# Patient Record
Sex: Female | Born: 1935 | Race: White | Hispanic: No | Smoking: Former smoker
Health system: Southern US, Community
[De-identification: ages and names within clinical notes are randomized; demographics above are authoritative.]

## PROBLEM LIST (undated history)

## (undated) DIAGNOSIS — M5090 Cervical disc disorder, unspecified, unspecified cervical region: Secondary | ICD-10-CM

## (undated) DIAGNOSIS — G8929 Other chronic pain: Secondary | ICD-10-CM

## (undated) DIAGNOSIS — M1990 Unspecified osteoarthritis, unspecified site: Secondary | ICD-10-CM

## (undated) DIAGNOSIS — I4811 Longstanding persistent atrial fibrillation: Secondary | ICD-10-CM

## (undated) DIAGNOSIS — E039 Hypothyroidism, unspecified: Secondary | ICD-10-CM

## (undated) DIAGNOSIS — I5032 Chronic diastolic (congestive) heart failure: Secondary | ICD-10-CM

## (undated) DIAGNOSIS — Z8679 Personal history of other diseases of the circulatory system: Secondary | ICD-10-CM

## (undated) DIAGNOSIS — N1330 Unspecified hydronephrosis: Secondary | ICD-10-CM

## (undated) DIAGNOSIS — I2510 Atherosclerotic heart disease of native coronary artery without angina pectoris: Secondary | ICD-10-CM

## (undated) DIAGNOSIS — I214 Non-ST elevation (NSTEMI) myocardial infarction: Secondary | ICD-10-CM

## (undated) DIAGNOSIS — I712 Thoracic aortic aneurysm, without rupture: Secondary | ICD-10-CM

## (undated) DIAGNOSIS — I7101 Dissection of thoracic aorta: Secondary | ICD-10-CM

## (undated) DIAGNOSIS — I10 Essential (primary) hypertension: Secondary | ICD-10-CM

## (undated) DIAGNOSIS — S32591A Other specified fracture of right pubis, initial encounter for closed fracture: Secondary | ICD-10-CM

## (undated) DIAGNOSIS — K219 Gastro-esophageal reflux disease without esophagitis: Secondary | ICD-10-CM

## (undated) DIAGNOSIS — Z953 Presence of xenogenic heart valve: Secondary | ICD-10-CM

## (undated) DIAGNOSIS — Z9889 Other specified postprocedural states: Secondary | ICD-10-CM

## (undated) HISTORY — PX: VAGINAL HYSTERECTOMY: SHX2639

## (undated) HISTORY — DX: Atherosclerotic heart disease of native coronary artery without angina pectoris: I25.10

## (undated) HISTORY — PX: OTHER SURGICAL HISTORY: SHX169

---

## 1998-09-09 ENCOUNTER — Emergency Department (HOSPITAL_COMMUNITY): Admission: EM | Admit: 1998-09-09 | Discharge: 1998-09-09 | Payer: Self-pay | Admitting: Emergency Medicine

## 1998-09-10 ENCOUNTER — Emergency Department (HOSPITAL_COMMUNITY): Admission: EM | Admit: 1998-09-10 | Discharge: 1998-09-10 | Payer: Self-pay | Admitting: Emergency Medicine

## 2000-07-07 ENCOUNTER — Encounter: Admission: RE | Admit: 2000-07-07 | Discharge: 2000-07-07 | Payer: Self-pay | Admitting: Internal Medicine

## 2000-07-07 ENCOUNTER — Encounter: Payer: Self-pay | Admitting: Internal Medicine

## 2001-12-28 ENCOUNTER — Encounter: Admission: RE | Admit: 2001-12-28 | Discharge: 2001-12-28 | Payer: Self-pay | Admitting: Internal Medicine

## 2001-12-28 ENCOUNTER — Encounter: Payer: Self-pay | Admitting: Internal Medicine

## 2002-12-30 ENCOUNTER — Encounter: Payer: Self-pay | Admitting: Internal Medicine

## 2002-12-30 ENCOUNTER — Encounter: Admission: RE | Admit: 2002-12-30 | Discharge: 2002-12-30 | Payer: Self-pay | Admitting: Internal Medicine

## 2004-02-27 ENCOUNTER — Encounter: Admission: RE | Admit: 2004-02-27 | Discharge: 2004-02-27 | Payer: Self-pay | Admitting: Internal Medicine

## 2004-04-01 ENCOUNTER — Ambulatory Visit (HOSPITAL_COMMUNITY): Admission: RE | Admit: 2004-04-01 | Discharge: 2004-04-01 | Payer: Self-pay | Admitting: Gastroenterology

## 2005-05-20 ENCOUNTER — Encounter: Admission: RE | Admit: 2005-05-20 | Discharge: 2005-05-20 | Payer: Self-pay | Admitting: Internal Medicine

## 2006-03-20 ENCOUNTER — Inpatient Hospital Stay (HOSPITAL_COMMUNITY): Admission: AD | Admit: 2006-03-20 | Discharge: 2006-03-23 | Payer: Self-pay | Admitting: Internal Medicine

## 2006-03-22 ENCOUNTER — Encounter (INDEPENDENT_AMBULATORY_CARE_PROVIDER_SITE_OTHER): Payer: Self-pay | Admitting: Interventional Cardiology

## 2006-06-06 ENCOUNTER — Encounter: Admission: RE | Admit: 2006-06-06 | Discharge: 2006-06-06 | Payer: Self-pay | Admitting: Internal Medicine

## 2006-06-15 ENCOUNTER — Encounter: Admission: RE | Admit: 2006-06-15 | Discharge: 2006-06-15 | Payer: Self-pay | Admitting: Internal Medicine

## 2006-07-10 ENCOUNTER — Ambulatory Visit (HOSPITAL_COMMUNITY): Admission: RE | Admit: 2006-07-10 | Discharge: 2006-07-10 | Payer: Self-pay | Admitting: Urology

## 2006-08-03 ENCOUNTER — Encounter: Admission: RE | Admit: 2006-08-03 | Discharge: 2006-08-03 | Payer: Self-pay | Admitting: Internal Medicine

## 2007-08-07 ENCOUNTER — Encounter: Admission: RE | Admit: 2007-08-07 | Discharge: 2007-08-07 | Payer: Self-pay | Admitting: Internal Medicine

## 2008-08-19 ENCOUNTER — Encounter: Admission: RE | Admit: 2008-08-19 | Discharge: 2008-08-19 | Payer: Self-pay | Admitting: Internal Medicine
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
## 2011-02-18 NOTE — Discharge Summary (Signed)
NAMEREIS, PIENTA             ACCOUNT NO.:  [id number]   MEDICAL RECORD NO.:  [id number]          PATIENT TYPE:  INP   LOCATION:  4713                         FACILITY:  [facility]   PHYSICIAN:  Thora Lance, M.D.  DATE OF BIRTH:  05-26-1934   DATE OF ADMISSION:  03/20/2006  DATE OF DISCHARGE:  03/23/2006                                 DISCHARGE SUMMARY   REASON FOR ADMISSION:  A 77 year old white female with history of  hypertension and hypothyroidism who presents with shortness of breath with  mild exertion and palpitations.  There was some chest pain.  In our office,  found to be in rapid atrial fibrillation with a rate of 145.   FINDINGS:  VITAL SIGNS:  Temperature 97.4, blood pressure 120/80, heart rate  of 145, respirations 16.  HEART:  Irregularly irregular.  LUNGS:  Clear.   EKG:  AFib with rapid ventricular response.   LABORATORIES AT ADMISSION:  CBC:  WBC 7.7, hemoglobin 13.1, platelet count  221.  PT 15, PTT 34.  Chemistry:  Sodium 134, potassium 4.2, chloride 100,  bicarbonate 24, glucose 98, BUN 24, creatinine 1.5, calcium 9.2, total  protein 5.8, albumin 3.3.  AST 89, ALT 176 and alk phos 54.  Total bilirubin  0.7, CK 85, CK MB 7.4.  Troponin I 0.03.  TSH 1.54.   Chest x-ray:  Unremarkable.   HOSPITAL COURSE:  The patient was admitted for a new onset of rapid atrial  fibrillation.  She was started on IV diltiazem.  Cardiology was consulted.  She became hypotensive the first hospital night on IV diltiazem and her rate  was dropped and she was started on digoxin.  Eventually, she was switched  over to p.o. diltiazem and digoxin with good control of her rate by  discharge.   She had a CT scan of the chest to rule out PE.  There was no evidence of  pulmonary embolus.  There was mild interstitial pulmonary edema and nodular  densities in the superior segment of the right lower lobe, likely reflecting  atelectasis, but followup imaging in 3 months was recommended.   There was  mild intrahepatic edema and pericholecystic fluid reflecting an element of  right heart failure.   A 2D echocardiogram was done and showed normal left ventricular function and  no significant abnormalities other than some mild thickness of the left  ventricular wall and some moderate focal basal septal hypertrophy.  There  was mild mitral regurgitation.   An adenosine Cardiolite showed no evidence of ischemia.  Thyroid function  was normal.  The patient felt well and was discharged in good condition.  She was started on both Lovenox and Coumadin.  At discharge, her INR was 1.2  and this will be followed up as an outpatient.  Her Lovenox will be  discontinued when her Coumadin is therapeutic.   The patient had complained of dysphagia during the hospitalization and was  discharged on a PPI.  Her dysphagia will be worked up as an outpatient if it  persists.   The patient did have mild elevation of her transaminases at  admission.  This  is felt likely related to passive congestion of her liver from atrial  fibrillation and mild CHF.  This will also be followed up as an outpatient,  as will be her creatinine which was minimally elevated.   DISCHARGE DIAGNOSES:  1.  Atrial fibrillation with rapid ventricular response.  2.  Hypertension.  3.  Elevated liver function tests.  4.  Dysphagia.  5.  Hypothyroidism.   PROCEDURES:  1.  CT scan of the chest.  2.  A 2D echocardiogram.  3.  Adenosine Cardiolite.   DISCHARGE MEDICATIONS:  1.  Diltiazem long-acting 120 mg a day.  2.  Atenolol 100 mg 1/2 a day.  3.  Coumadin 5 mg 1-1/2 tonight; will check INR tomorrow.  4.  Lovenox 60 mg subcu every 12 hours.  5.  Synthroid 0.1 mg once a day.  6.  Premarin 0.625 mg once a day.  7.  Prilosec-OTC 20 mg a day.  8.  Lisinopril stopped.   DISPOSITION:  Discharged to home.   DIET:  Low-sodium diet.   ACTIVITY:  As tolerated.   FOLLOWUP:  In one day with Dr. Valentina Lucks.            ______________________________  Thora Lance, M.D.     JJG/MEDQ  D:  03/23/2006  T:  03/23/2006  Job:  [id number]   cc:   Armanda Magic, M.D.  Fax: [phone]

## 2011-02-18 NOTE — Op Note (Signed)
NAME:  Lydia Jordan, Lydia Jordan                       ACCOUNT NO.:  [id number]   MEDICAL RECORD NO.:  [id number]                   PATIENT TYPE:  AMB   LOCATION:  ENDO                                 FACILITY:  [facility]   PHYSICIAN:  Danise Edge, M.D.                DATE OF BIRTH:  Apr 17, 1934   DATE OF PROCEDURE:  04/01/2004  DATE OF DISCHARGE:                                 OPERATIVE REPORT   PROCEDURE:  Screening colonoscopy.   INDICATIONS FOR PROCEDURE:  Ms. Berneda Piccininni is a 77 year old female born  1934/08/29.  Ms. Decoursey is scheduled to undergo her first screening  colonoscopy with polypectomy to prevent colon cancer.   ENDOSCOPIST:  Danise Edge, M.D.   PREMEDICATION:  Versed 10 mg, Demerol 50 mg.   DESCRIPTION OF PROCEDURE:  After obtaining informed consent, Ms. Bumpass was  placed in the left lateral decubitus position. I administered intravenous  Demerol and intravenous Versed to achieve conscious sedation for the  procedure. The patient's blood pressure, oxygen saturation and cardiac  rhythm were monitored throughout the procedure and documented in the medical  record.   Anal inspection and digital rectal exam were normal. The Olympus adjustable  pediatric colonoscope was introduced into the rectum and advanced to the  cecum. Colonic preparation for the exam today was excellent.   RECTUM:  Normal.   SIGMOID COLON AND DESCENDING COLON:  Normal.   SPLENIC FLEXURE:  Normal.   TRANSVERSE COLON:  Normal.   HEPATIC FLEXURE:  Normal.   ASCENDING COLON:  Normal.   CECUM AND ILEOCECAL VALVE:  Normal.   ASSESSMENT:  Normal screening proctocolonoscopy to the cecum.                                               Danise Edge, M.D.    MJ/MEDQ  D:  04/01/2004  T:  04/01/2004  Job:  [id number]   cc:   Thora Lance, M.D.  [address]  [place]  [place] [address]  Fax: [phone]

## 2012-01-10 DIAGNOSIS — I10 Essential (primary) hypertension: Secondary | ICD-10-CM | POA: Diagnosis not present

## 2012-01-10 DIAGNOSIS — K219 Gastro-esophageal reflux disease without esophagitis: Secondary | ICD-10-CM | POA: Diagnosis not present

## 2012-01-10 DIAGNOSIS — E039 Hypothyroidism, unspecified: Secondary | ICD-10-CM | POA: Diagnosis not present

## 2012-01-10 DIAGNOSIS — Z1331 Encounter for screening for depression: Secondary | ICD-10-CM | POA: Diagnosis not present

## 2012-01-17 DIAGNOSIS — E875 Hyperkalemia: Secondary | ICD-10-CM | POA: Diagnosis not present

## 2012-04-23 DIAGNOSIS — H269 Unspecified cataract: Secondary | ICD-10-CM | POA: Diagnosis not present

## 2012-07-11 DIAGNOSIS — M1990 Unspecified osteoarthritis, unspecified site: Secondary | ICD-10-CM | POA: Diagnosis not present

## 2012-07-11 DIAGNOSIS — I10 Essential (primary) hypertension: Secondary | ICD-10-CM | POA: Diagnosis not present

## 2012-07-11 DIAGNOSIS — K219 Gastro-esophageal reflux disease without esophagitis: Secondary | ICD-10-CM | POA: Diagnosis not present

## 2012-09-01 ENCOUNTER — Emergency Department (HOSPITAL_COMMUNITY)
Admission: EM | Admit: 2012-09-01 | Discharge: 2012-09-01 | Disposition: A | Discharge status: Home / Self Care | Payer: Medicare Other | Attending: Emergency Medicine | Admitting: Emergency Medicine

## 2012-09-01 ENCOUNTER — Encounter (HOSPITAL_COMMUNITY): Payer: Self-pay | Admitting: Emergency Medicine

## 2012-09-01 DIAGNOSIS — Z79899 Other long term (current) drug therapy: Secondary | ICD-10-CM | POA: Diagnosis not present

## 2012-09-01 DIAGNOSIS — Z7982 Long term (current) use of aspirin: Secondary | ICD-10-CM | POA: Diagnosis not present

## 2012-09-01 DIAGNOSIS — K219 Gastro-esophageal reflux disease without esophagitis: Secondary | ICD-10-CM | POA: Diagnosis not present

## 2012-09-01 DIAGNOSIS — E079 Disorder of thyroid, unspecified: Secondary | ICD-10-CM | POA: Diagnosis not present

## 2012-09-01 DIAGNOSIS — Z8679 Personal history of other diseases of the circulatory system: Secondary | ICD-10-CM | POA: Insufficient documentation

## 2012-09-01 DIAGNOSIS — R040 Epistaxis: Secondary | ICD-10-CM

## 2012-09-01 DIAGNOSIS — I10 Essential (primary) hypertension: Secondary | ICD-10-CM | POA: Insufficient documentation

## 2012-09-01 DIAGNOSIS — Z8739 Personal history of other diseases of the musculoskeletal system and connective tissue: Secondary | ICD-10-CM | POA: Diagnosis not present

## 2012-09-01 HISTORY — DX: Unspecified osteoarthritis, unspecified site: M19.90

## 2012-09-01 HISTORY — DX: Gastro-esophageal reflux disease without esophagitis: K21.9

## 2012-09-01 HISTORY — DX: Essential (primary) hypertension: I10

## 2012-09-01 NOTE — ED Notes (Signed)
Patient states that she had nose bleed yesterday and it stopped. The patient also reports that she has had this nose bleed intermittently from 8 am this am.

## 2012-09-01 NOTE — ED Provider Notes (Signed)
History     CSN: [id number]  Arrival date & time 09/01/12  1619   First MD Initiated Contact with Patient 09/01/12 1749      Chief Complaint  Patient presents with  . Epistaxis    (Consider location/radiation/quality/duration/timing/severity/associated sxs/prior treatment) Patient is a 78 y.o. female presenting with nosebleeds. The history is provided by the patient.  Epistaxis  This is a new problem. The current episode started yesterday. The problem occurs daily (this morning wose then yesterday ). The problem has been resolved. The problem is associated with an unknown factor. The bleeding has been from both nares. She has tried applying pressure for the symptoms. Her past medical history is significant for colds and allergies. Her past medical history does not include bleeding disorder, sinus problems, nose-picking or frequent nosebleeds.    Past Medical History  Diagnosis Date  . Thyroid disease   . Hypertension   . Acid reflux   . Irregular heart rhythm   . Arthritis     Past Surgical History  Procedure Date  . Vaginal hysterectomy     No family history on file.  History  Substance Use Topics  . Smoking status: Never Smoker   . Smokeless tobacco: Not on file  . Alcohol Use: Yes     Comment: occasional glass of wine    OB History    Grav Para Term Preterm Abortions TAB SAB Ect Mult Living                  Review of Systems  Constitutional: Negative for fever, diaphoresis and activity change.  HENT: Positive for nosebleeds. Negative for congestion and neck pain.   Respiratory: Negative for cough.   Genitourinary: Negative for dysuria.  Musculoskeletal: Negative for myalgias.  Skin: Negative for color change and wound.  Neurological: Negative for headaches.  All other systems reviewed and are negative.    Allergies  Demerol  Home Medications   Current Outpatient Rx  Name  Route  Sig  Dispense  Refill  . ASPIRIN EC 325 MG PO TBEC   Oral   Take  325 mg by mouth daily.         Marland Kitchen DIGOXIN 0.125 MG PO TABS   Oral   Take 0.125 mg by mouth every morning.         Marland Kitchen OMEGA-3 FATTY ACIDS 1000 MG PO CAPS   Oral   Take 1 g by mouth daily.         Marland Kitchen GLUCOSAMINE-CHONDROITIN 500-400 MG PO TABS   Oral   Take 2 tablets by mouth daily.         Marland Kitchen HYDROCODONE-ACETAMINOPHEN 5-500 MG PO TABS   Oral   Take 1 tablet by mouth at bedtime. For pain         . LEVOTHYROXINE SODIUM 88 MCG PO TABS   Oral   Take 88 mcg by mouth every morning.         Marland Kitchen SALONPAS PAIN RELIEF PATCH EX   Apply externally   Apply 1 patch topically daily as needed. For muscle pain         . LISINOPRIL-HYDROCHLOROTHIAZIDE 10-12.5 MG PO TABS   Oral   Take 1 tablet by mouth every morning.         . NEBIVOLOL HCL 5 MG PO TABS   Oral   Take 5 mg by mouth every morning.         Marland Kitchen OMEPRAZOLE 40 MG PO CPDR   Oral  Take 40 mg by mouth daily.           BP 125/88  Pulse 84  Temp 97.8 F (36.6 C) (Oral)  Resp 16  SpO2 100%  Physical Exam  Nursing note and vitals reviewed. Constitutional: She is oriented to person, place, and time. She appears well-developed and well-nourished. No distress.  HENT:  Head: Normocephalic and atraumatic.       Left nare nl, right w anterior visibile clot  Normal L & R external ear canals and TMs. No tragal or mastoid tenderness. Oropharynx moist, without tonsillar exudate.   Eyes:       No pain w EOM. Conjunctiva normal   Neck: Normal range of motion.       Soft, no nuchal rigidity. No lymphadenopathy  Cardiovascular:       RRR, no aberrancy on auscultation  Pulmonary/Chest: Effort normal.       LCAB, no respiratory distress  Musculoskeletal: Normal range of motion.  Neurological: She is alert and oriented to person, place, and time.  Skin: Skin is warm and dry. She is not diaphoretic.       No rash  Psychiatric: She has a normal mood and affect. Her behavior is normal.    ED Course  Procedures (including  critical care time)  Labs Reviewed - No data to display No results found.   No diagnosis found.    MDM  Epistaxis  78 yo F to ER c/o epistaxis, now resolved. Pt not taking blood thinner. Discussed conservative home methods and reasons to return to the ER.          Jaci Carrel, [place] 09/01/12 1610

## 2012-09-01 NOTE — ED Provider Notes (Signed)
I personally performed the services described in this documentation, which was scribed in my presence. The recorded information has been reviewed and is accurate.  Flint Melter, MD 09/01/12 [phone]

## 2013-01-15 DIAGNOSIS — M159 Polyosteoarthritis, unspecified: Secondary | ICD-10-CM | POA: Diagnosis not present

## 2013-01-15 DIAGNOSIS — I10 Essential (primary) hypertension: Secondary | ICD-10-CM | POA: Diagnosis not present

## 2013-01-15 DIAGNOSIS — E7800 Pure hypercholesterolemia, unspecified: Secondary | ICD-10-CM | POA: Diagnosis not present

## 2013-01-15 DIAGNOSIS — Z0000 Encounter for general adult medical examination without abnormal findings: Secondary | ICD-10-CM | POA: Diagnosis not present

## 2013-01-15 DIAGNOSIS — K219 Gastro-esophageal reflux disease without esophagitis: Secondary | ICD-10-CM | POA: Diagnosis not present

## 2013-01-15 DIAGNOSIS — M79609 Pain in unspecified limb: Secondary | ICD-10-CM | POA: Diagnosis not present

## 2013-01-15 DIAGNOSIS — E039 Hypothyroidism, unspecified: Secondary | ICD-10-CM | POA: Diagnosis not present

## 2013-01-15 DIAGNOSIS — Z1331 Encounter for screening for depression: Secondary | ICD-10-CM | POA: Diagnosis not present

## 2013-05-16 DIAGNOSIS — M159 Polyosteoarthritis, unspecified: Secondary | ICD-10-CM | POA: Diagnosis not present

## 2013-05-16 DIAGNOSIS — I10 Essential (primary) hypertension: Secondary | ICD-10-CM | POA: Diagnosis not present

## 2013-05-16 DIAGNOSIS — M5412 Radiculopathy, cervical region: Secondary | ICD-10-CM | POA: Diagnosis not present

## 2013-10-14 DIAGNOSIS — H269 Unspecified cataract: Secondary | ICD-10-CM | POA: Diagnosis not present

## 2013-10-14 DIAGNOSIS — H52229 Regular astigmatism, unspecified eye: Secondary | ICD-10-CM | POA: Diagnosis not present

## 2013-10-14 DIAGNOSIS — H5200 Hypermetropia, unspecified eye: Secondary | ICD-10-CM | POA: Diagnosis not present

## 2013-10-14 DIAGNOSIS — H524 Presbyopia: Secondary | ICD-10-CM | POA: Diagnosis not present

## 2013-12-03 DIAGNOSIS — K219 Gastro-esophageal reflux disease without esophagitis: Secondary | ICD-10-CM | POA: Diagnosis not present

## 2013-12-03 DIAGNOSIS — E039 Hypothyroidism, unspecified: Secondary | ICD-10-CM | POA: Diagnosis not present

## 2013-12-03 DIAGNOSIS — E7800 Pure hypercholesterolemia, unspecified: Secondary | ICD-10-CM | POA: Diagnosis not present

## 2013-12-03 DIAGNOSIS — N183 Chronic kidney disease, stage 3 unspecified: Secondary | ICD-10-CM | POA: Diagnosis not present

## 2013-12-03 DIAGNOSIS — I10 Essential (primary) hypertension: Secondary | ICD-10-CM | POA: Diagnosis not present

## 2013-12-03 DIAGNOSIS — Z23 Encounter for immunization: Secondary | ICD-10-CM | POA: Diagnosis not present

## 2014-02-04 DIAGNOSIS — E039 Hypothyroidism, unspecified: Secondary | ICD-10-CM | POA: Diagnosis not present

## 2014-06-05 DIAGNOSIS — I4891 Unspecified atrial fibrillation: Secondary | ICD-10-CM | POA: Diagnosis not present

## 2014-06-05 DIAGNOSIS — M542 Cervicalgia: Secondary | ICD-10-CM | POA: Diagnosis not present

## 2014-06-05 DIAGNOSIS — N183 Chronic kidney disease, stage 3 unspecified: Secondary | ICD-10-CM | POA: Diagnosis not present

## 2014-06-05 DIAGNOSIS — I10 Essential (primary) hypertension: Secondary | ICD-10-CM | POA: Diagnosis not present

## 2014-07-23 ENCOUNTER — Other Ambulatory Visit: Payer: Self-pay | Admitting: Internal Medicine

## 2014-07-23 DIAGNOSIS — M542 Cervicalgia: Secondary | ICD-10-CM

## 2014-07-23 DIAGNOSIS — M5010 Cervical disc disorder with radiculopathy, unspecified cervical region: Secondary | ICD-10-CM

## 2014-07-25 ENCOUNTER — Other Ambulatory Visit: Payer: PRIVATE HEALTH INSURANCE

## 2014-08-11 ENCOUNTER — Ambulatory Visit
Admission: RE | Admit: 2014-08-11 | Discharge: 2014-08-11 | Disposition: A | Discharge status: Home / Self Care | Payer: Medicare Other | Source: Ambulatory Visit | Attending: Internal Medicine | Admitting: Internal Medicine

## 2014-08-11 DIAGNOSIS — M4802 Spinal stenosis, cervical region: Secondary | ICD-10-CM | POA: Diagnosis not present

## 2014-08-11 DIAGNOSIS — M542 Cervicalgia: Secondary | ICD-10-CM

## 2014-08-11 DIAGNOSIS — M5010 Cervical disc disorder with radiculopathy, unspecified cervical region: Secondary | ICD-10-CM

## 2014-08-11 DIAGNOSIS — M5031 Other cervical disc degeneration,  high cervical region: Secondary | ICD-10-CM | POA: Diagnosis not present

## 2014-08-11 DIAGNOSIS — M47812 Spondylosis without myelopathy or radiculopathy, cervical region: Secondary | ICD-10-CM | POA: Diagnosis not present

## 2014-11-04 DIAGNOSIS — M5023 Other cervical disc displacement, cervicothoracic region: Secondary | ICD-10-CM | POA: Diagnosis not present

## 2014-11-04 DIAGNOSIS — M542 Cervicalgia: Secondary | ICD-10-CM | POA: Diagnosis not present

## 2014-11-04 DIAGNOSIS — M25511 Pain in right shoulder: Secondary | ICD-10-CM | POA: Diagnosis not present

## 2014-11-12 DIAGNOSIS — M542 Cervicalgia: Secondary | ICD-10-CM | POA: Diagnosis not present

## 2014-11-12 DIAGNOSIS — M5033 Other cervical disc degeneration, cervicothoracic region: Secondary | ICD-10-CM | POA: Diagnosis not present

## 2014-11-12 DIAGNOSIS — M5032 Other cervical disc degeneration, mid-cervical region: Secondary | ICD-10-CM | POA: Diagnosis not present

## 2014-11-12 DIAGNOSIS — M25511 Pain in right shoulder: Secondary | ICD-10-CM | POA: Diagnosis not present

## 2014-11-26 DIAGNOSIS — M5033 Other cervical disc degeneration, cervicothoracic region: Secondary | ICD-10-CM | POA: Diagnosis not present

## 2014-12-08 DIAGNOSIS — Z1389 Encounter for screening for other disorder: Secondary | ICD-10-CM | POA: Diagnosis not present

## 2014-12-08 DIAGNOSIS — N183 Chronic kidney disease, stage 3 (moderate): Secondary | ICD-10-CM | POA: Diagnosis not present

## 2014-12-08 DIAGNOSIS — E039 Hypothyroidism, unspecified: Secondary | ICD-10-CM | POA: Diagnosis not present

## 2014-12-08 DIAGNOSIS — M4802 Spinal stenosis, cervical region: Secondary | ICD-10-CM | POA: Diagnosis not present

## 2014-12-08 DIAGNOSIS — K219 Gastro-esophageal reflux disease without esophagitis: Secondary | ICD-10-CM | POA: Diagnosis not present

## 2014-12-08 DIAGNOSIS — I129 Hypertensive chronic kidney disease with stage 1 through stage 4 chronic kidney disease, or unspecified chronic kidney disease: Secondary | ICD-10-CM | POA: Diagnosis not present

## 2014-12-08 DIAGNOSIS — I481 Persistent atrial fibrillation: Secondary | ICD-10-CM | POA: Diagnosis not present

## 2014-12-08 DIAGNOSIS — Z5181 Encounter for therapeutic drug level monitoring: Secondary | ICD-10-CM | POA: Diagnosis not present

## 2014-12-08 DIAGNOSIS — Z0000 Encounter for general adult medical examination without abnormal findings: Secondary | ICD-10-CM | POA: Diagnosis not present

## 2015-02-05 DIAGNOSIS — S0083XA Contusion of other part of head, initial encounter: Secondary | ICD-10-CM | POA: Diagnosis not present

## 2015-02-05 DIAGNOSIS — Z9189 Other specified personal risk factors, not elsewhere classified: Secondary | ICD-10-CM | POA: Diagnosis not present

## 2015-02-10 ENCOUNTER — Other Ambulatory Visit: Payer: Self-pay | Admitting: Internal Medicine

## 2015-02-10 ENCOUNTER — Ambulatory Visit
Admission: RE | Admit: 2015-02-10 | Discharge: 2015-02-10 | Disposition: A | Discharge status: Home / Self Care | Payer: Medicare Other | Source: Ambulatory Visit | Attending: Internal Medicine | Admitting: Internal Medicine

## 2015-02-10 DIAGNOSIS — G44309 Post-traumatic headache, unspecified, not intractable: Secondary | ICD-10-CM | POA: Diagnosis not present

## 2015-02-10 DIAGNOSIS — S0990XA Unspecified injury of head, initial encounter: Secondary | ICD-10-CM

## 2015-02-10 DIAGNOSIS — S0990XS Unspecified injury of head, sequela: Secondary | ICD-10-CM | POA: Diagnosis not present

## 2015-02-10 DIAGNOSIS — G44311 Acute post-traumatic headache, intractable: Secondary | ICD-10-CM

## 2015-02-10 DIAGNOSIS — S060X9A Concussion with loss of consciousness of unspecified duration, initial encounter: Secondary | ICD-10-CM | POA: Diagnosis not present

## 2015-02-10 DIAGNOSIS — R51 Headache: Secondary | ICD-10-CM | POA: Diagnosis not present

## 2015-03-18 DIAGNOSIS — Z79899 Other long term (current) drug therapy: Secondary | ICD-10-CM | POA: Diagnosis not present

## 2015-03-18 DIAGNOSIS — Z7901 Long term (current) use of anticoagulants: Secondary | ICD-10-CM | POA: Diagnosis not present

## 2015-03-18 DIAGNOSIS — I10 Essential (primary) hypertension: Secondary | ICD-10-CM | POA: Diagnosis not present

## 2015-03-18 DIAGNOSIS — S5001XA Contusion of right elbow, initial encounter: Secondary | ICD-10-CM | POA: Diagnosis not present

## 2015-03-18 DIAGNOSIS — E079 Disorder of thyroid, unspecified: Secondary | ICD-10-CM | POA: Diagnosis not present

## 2015-03-18 DIAGNOSIS — S0990XA Unspecified injury of head, initial encounter: Secondary | ICD-10-CM | POA: Diagnosis not present

## 2015-03-18 DIAGNOSIS — K219 Gastro-esophageal reflux disease without esophagitis: Secondary | ICD-10-CM | POA: Diagnosis not present

## 2015-03-18 DIAGNOSIS — Z885 Allergy status to narcotic agent status: Secondary | ICD-10-CM | POA: Diagnosis not present

## 2015-04-08 DIAGNOSIS — H2513 Age-related nuclear cataract, bilateral: Secondary | ICD-10-CM | POA: Diagnosis not present

## 2015-04-08 DIAGNOSIS — H5203 Hypermetropia, bilateral: Secondary | ICD-10-CM | POA: Diagnosis not present

## 2015-04-08 DIAGNOSIS — H52223 Regular astigmatism, bilateral: Secondary | ICD-10-CM | POA: Diagnosis not present

## 2015-05-09 DIAGNOSIS — R55 Syncope and collapse: Secondary | ICD-10-CM | POA: Diagnosis not present

## 2015-05-09 DIAGNOSIS — N390 Urinary tract infection, site not specified: Secondary | ICD-10-CM | POA: Diagnosis not present

## 2015-05-09 DIAGNOSIS — I959 Hypotension, unspecified: Secondary | ICD-10-CM | POA: Diagnosis not present

## 2015-05-09 DIAGNOSIS — I10 Essential (primary) hypertension: Secondary | ICD-10-CM | POA: Diagnosis not present

## 2015-05-09 DIAGNOSIS — E860 Dehydration: Secondary | ICD-10-CM | POA: Diagnosis not present

## 2015-05-09 DIAGNOSIS — I4891 Unspecified atrial fibrillation: Secondary | ICD-10-CM | POA: Diagnosis not present

## 2015-05-15 DIAGNOSIS — R55 Syncope and collapse: Secondary | ICD-10-CM | POA: Diagnosis not present

## 2015-05-15 DIAGNOSIS — R112 Nausea with vomiting, unspecified: Secondary | ICD-10-CM | POA: Diagnosis not present

## 2015-05-15 DIAGNOSIS — I129 Hypertensive chronic kidney disease with stage 1 through stage 4 chronic kidney disease, or unspecified chronic kidney disease: Secondary | ICD-10-CM | POA: Diagnosis not present

## 2015-05-15 DIAGNOSIS — M4802 Spinal stenosis, cervical region: Secondary | ICD-10-CM | POA: Diagnosis not present

## 2015-06-11 DIAGNOSIS — I10 Essential (primary) hypertension: Secondary | ICD-10-CM | POA: Diagnosis not present

## 2015-06-11 DIAGNOSIS — R1310 Dysphagia, unspecified: Secondary | ICD-10-CM | POA: Diagnosis not present

## 2015-06-11 DIAGNOSIS — R1013 Epigastric pain: Secondary | ICD-10-CM | POA: Diagnosis not present

## 2015-06-12 DIAGNOSIS — I4891 Unspecified atrial fibrillation: Secondary | ICD-10-CM | POA: Diagnosis not present

## 2015-06-12 DIAGNOSIS — R109 Unspecified abdominal pain: Secondary | ICD-10-CM | POA: Diagnosis not present

## 2015-06-12 DIAGNOSIS — K219 Gastro-esophageal reflux disease without esophagitis: Secondary | ICD-10-CM | POA: Diagnosis not present

## 2015-06-12 DIAGNOSIS — R634 Abnormal weight loss: Secondary | ICD-10-CM | POA: Diagnosis not present

## 2015-06-12 DIAGNOSIS — R1310 Dysphagia, unspecified: Secondary | ICD-10-CM | POA: Diagnosis not present

## 2015-06-12 DIAGNOSIS — R112 Nausea with vomiting, unspecified: Secondary | ICD-10-CM | POA: Diagnosis not present

## 2015-06-16 DIAGNOSIS — R1310 Dysphagia, unspecified: Secondary | ICD-10-CM | POA: Diagnosis not present

## 2015-06-16 DIAGNOSIS — K449 Diaphragmatic hernia without obstruction or gangrene: Secondary | ICD-10-CM | POA: Diagnosis not present

## 2015-06-16 DIAGNOSIS — R079 Chest pain, unspecified: Secondary | ICD-10-CM | POA: Diagnosis not present

## 2015-06-16 DIAGNOSIS — R634 Abnormal weight loss: Secondary | ICD-10-CM | POA: Diagnosis not present

## 2015-06-18 ENCOUNTER — Other Ambulatory Visit: Payer: Self-pay | Admitting: Gastroenterology

## 2015-06-18 DIAGNOSIS — R4702 Dysphasia: Secondary | ICD-10-CM

## 2015-06-23 ENCOUNTER — Ambulatory Visit
Admission: RE | Admit: 2015-06-23 | Discharge: 2015-06-23 | Disposition: A | Discharge status: Home / Self Care | Payer: Medicare Other | Source: Ambulatory Visit | Attending: Gastroenterology | Admitting: Gastroenterology

## 2015-06-23 DIAGNOSIS — R4702 Dysphasia: Secondary | ICD-10-CM

## 2015-06-23 DIAGNOSIS — R1310 Dysphagia, unspecified: Secondary | ICD-10-CM | POA: Diagnosis not present

## 2015-07-07 DIAGNOSIS — E538 Deficiency of other specified B group vitamins: Secondary | ICD-10-CM | POA: Diagnosis not present

## 2015-07-07 DIAGNOSIS — J029 Acute pharyngitis, unspecified: Secondary | ICD-10-CM | POA: Diagnosis not present

## 2015-07-07 DIAGNOSIS — R1314 Dysphagia, pharyngoesophageal phase: Secondary | ICD-10-CM | POA: Diagnosis not present

## 2015-07-07 DIAGNOSIS — I129 Hypertensive chronic kidney disease with stage 1 through stage 4 chronic kidney disease, or unspecified chronic kidney disease: Secondary | ICD-10-CM | POA: Diagnosis not present

## 2015-07-08 DIAGNOSIS — D485 Neoplasm of uncertain behavior of skin: Secondary | ICD-10-CM | POA: Diagnosis not present

## 2015-07-09 DIAGNOSIS — C44722 Squamous cell carcinoma of skin of right lower limb, including hip: Secondary | ICD-10-CM | POA: Diagnosis not present

## 2015-07-31 ENCOUNTER — Ambulatory Visit (HOSPITAL_COMMUNITY)
Admission: RE | Admit: 2015-07-31 | Discharge: 2015-07-31 | Disposition: A | Discharge status: Home / Self Care | Payer: Medicare Other | Source: Ambulatory Visit | Attending: Gastroenterology | Admitting: Gastroenterology

## 2015-07-31 ENCOUNTER — Encounter (HOSPITAL_COMMUNITY)
Admission: RE | Disposition: A | Discharge status: Home / Self Care | Payer: Self-pay | Source: Ambulatory Visit | Attending: Gastroenterology

## 2015-07-31 DIAGNOSIS — R1310 Dysphagia, unspecified: Secondary | ICD-10-CM | POA: Insufficient documentation

## 2015-07-31 HISTORY — PX: ESOPHAGEAL MANOMETRY: SHX5429

## 2015-07-31 SURGERY — MANOMETRY, ESOPHAGUS

## 2015-07-31 MED ORDER — LIDOCAINE VISCOUS 2 % MT SOLN
OROMUCOSAL | Status: AC
  Filled 2015-07-31: qty 15

## 2015-07-31 SURGICAL SUPPLY — 2 items
FACESHIELD LNG OPTICON STERILE (SAFETY) IMPLANT
GLOVE BIO SURGEON STRL SZ8 (GLOVE) ×4 IMPLANT

## 2015-08-03 ENCOUNTER — Encounter (HOSPITAL_COMMUNITY): Payer: Self-pay | Admitting: Gastroenterology

## 2015-08-13 DIAGNOSIS — L905 Scar conditions and fibrosis of skin: Secondary | ICD-10-CM | POA: Diagnosis not present

## 2015-08-13 DIAGNOSIS — C44722 Squamous cell carcinoma of skin of right lower limb, including hip: Secondary | ICD-10-CM | POA: Diagnosis not present

## 2015-08-20 DIAGNOSIS — K220 Achalasia of cardia: Secondary | ICD-10-CM | POA: Diagnosis not present

## 2015-08-26 ENCOUNTER — Encounter (HOSPITAL_COMMUNITY): Payer: Self-pay | Admitting: *Deleted

## 2015-08-26 ENCOUNTER — Other Ambulatory Visit: Payer: Self-pay | Admitting: Gastroenterology

## 2015-08-31 ENCOUNTER — Other Ambulatory Visit: Payer: Self-pay | Admitting: Gastroenterology

## 2015-08-31 NOTE — Addendum Note (Signed)
Addended by: Arta Silence on: 08/31/2015 10:06 AM   Modules accepted: Orders

## 2015-08-31 NOTE — Addendum Note (Signed)
Addended by: Arta Silence on: 08/31/2015 08:01 AM   Modules accepted: Orders

## 2015-09-02 ENCOUNTER — Encounter (HOSPITAL_COMMUNITY)
Admission: RE | Disposition: A | Discharge status: Home / Self Care | Payer: Self-pay | Source: Ambulatory Visit | Attending: Gastroenterology

## 2015-09-02 ENCOUNTER — Ambulatory Visit (HOSPITAL_COMMUNITY)
Admission: RE | Admit: 2015-09-02 | Discharge: 2015-09-02 | Disposition: A | Discharge status: Home / Self Care | Payer: Medicare Other | Source: Ambulatory Visit | Attending: Gastroenterology | Admitting: Gastroenterology

## 2015-09-02 ENCOUNTER — Encounter (HOSPITAL_COMMUNITY): Payer: Self-pay | Admitting: *Deleted

## 2015-09-02 ENCOUNTER — Ambulatory Visit (HOSPITAL_COMMUNITY): Payer: Medicare Other | Admitting: Anesthesiology

## 2015-09-02 DIAGNOSIS — K219 Gastro-esophageal reflux disease without esophagitis: Secondary | ICD-10-CM | POA: Insufficient documentation

## 2015-09-02 DIAGNOSIS — Z7901 Long term (current) use of anticoagulants: Secondary | ICD-10-CM | POA: Insufficient documentation

## 2015-09-02 DIAGNOSIS — E785 Hyperlipidemia, unspecified: Secondary | ICD-10-CM | POA: Diagnosis not present

## 2015-09-02 DIAGNOSIS — R1310 Dysphagia, unspecified: Secondary | ICD-10-CM | POA: Diagnosis not present

## 2015-09-02 DIAGNOSIS — Z79891 Long term (current) use of opiate analgesic: Secondary | ICD-10-CM | POA: Insufficient documentation

## 2015-09-02 DIAGNOSIS — M1990 Unspecified osteoarthritis, unspecified site: Secondary | ICD-10-CM | POA: Insufficient documentation

## 2015-09-02 DIAGNOSIS — N183 Chronic kidney disease, stage 3 (moderate): Secondary | ICD-10-CM | POA: Insufficient documentation

## 2015-09-02 DIAGNOSIS — Z79899 Other long term (current) drug therapy: Secondary | ICD-10-CM | POA: Insufficient documentation

## 2015-09-02 DIAGNOSIS — Z87891 Personal history of nicotine dependence: Secondary | ICD-10-CM | POA: Diagnosis not present

## 2015-09-02 DIAGNOSIS — K220 Achalasia of cardia: Secondary | ICD-10-CM | POA: Insufficient documentation

## 2015-09-02 DIAGNOSIS — R918 Other nonspecific abnormal finding of lung field: Secondary | ICD-10-CM | POA: Diagnosis not present

## 2015-09-02 DIAGNOSIS — I129 Hypertensive chronic kidney disease with stage 1 through stage 4 chronic kidney disease, or unspecified chronic kidney disease: Secondary | ICD-10-CM | POA: Insufficient documentation

## 2015-09-02 DIAGNOSIS — E039 Hypothyroidism, unspecified: Secondary | ICD-10-CM | POA: Diagnosis not present

## 2015-09-02 DIAGNOSIS — I4891 Unspecified atrial fibrillation: Secondary | ICD-10-CM | POA: Insufficient documentation

## 2015-09-02 HISTORY — DX: Hypothyroidism, unspecified: E03.9

## 2015-09-02 HISTORY — PX: ESOPHAGOGASTRODUODENOSCOPY (EGD) WITH PROPOFOL: SHX5813

## 2015-09-02 SURGERY — ESOPHAGOGASTRODUODENOSCOPY (EGD) WITH PROPOFOL
Anesthesia: Monitor Anesthesia Care

## 2015-09-02 MED ORDER — APIXABAN 5 MG PO TABS: 5.0000 mg | ORAL_TABLET | Freq: Two times a day (BID) | ORAL | Status: DC

## 2015-09-02 MED ORDER — SODIUM CHLORIDE 0.9 % IJ SOLN
100.0000 [IU] | Freq: Once | INTRAMUSCULAR | Status: AC
  Administered 2015-09-02: 100 [IU] via SUBMUCOSAL
  Filled 2015-09-02: qty 100

## 2015-09-02 MED ORDER — PROPOFOL 10 MG/ML IV BOLUS
INTRAVENOUS | Status: AC
  Filled 2015-09-02: qty 40

## 2015-09-02 MED ORDER — LIDOCAINE HCL (CARDIAC) 20 MG/ML IV SOLN
INTRAVENOUS | Status: AC
  Filled 2015-09-02: qty 5

## 2015-09-02 MED ORDER — SODIUM CHLORIDE 0.9 % IV SOLN: INTRAVENOUS | Status: DC

## 2015-09-02 MED ORDER — SODIUM CHLORIDE 0.9 % IJ SOLN
INTRAMUSCULAR | Status: AC
  Filled 2015-09-02: qty 10

## 2015-09-02 MED ORDER — PROPOFOL 500 MG/50ML IV EMUL
INTRAVENOUS | Status: DC | PRN
  Administered 2015-09-02: 140 ug/kg/min via INTRAVENOUS

## 2015-09-02 MED ORDER — PROPOFOL 500 MG/50ML IV EMUL
INTRAVENOUS | Status: DC | PRN
  Administered 2015-09-02: 40 mg via INTRAVENOUS
  Administered 2015-09-02: 20 mg via INTRAVENOUS

## 2015-09-02 MED ORDER — LACTATED RINGERS IV SOLN
INTRAVENOUS | Status: DC
  Administered 2015-09-02: 1000 mL via INTRAVENOUS

## 2015-09-02 SURGICAL SUPPLY — 14 items

## 2015-09-02 NOTE — Op Note (Signed)
[facility] [place], [address]   ENDOSCOPY PROCEDURE REPORT  PATIENT: Lydia Jordan, Lydia Jordan  MR#: [id number] BIRTHDATE: March 14, 1934 , 81  yrs. old GENDER: female ENDOSCOPIST: Arta Silence, MD REFERRED BY:  Lavone Orn, M.D. PROCEDURE DATE:  09-03-15 PROCEDURE:  EGD w/ directed submucosal injection(s), any substance ASA CLASS:     Class II INDICATIONS:  dysphagia, achalasia. MEDICATIONS: Monitored anesthesia care TOPICAL ANESTHETIC: none  DESCRIPTION OF PROCEDURE: After the risks benefits and alternatives of the procedure were thoroughly explained, informed consent was obtained.  The Pentax Gastroscope [id number] endoscope was introduced through the mouth and advanced to the second portion of the duodenum. The instrument was slowly withdrawn as the mucosa was fully examined. Estimated blood loss is zero unless otherwise noted in this procedure report.    Findings:  Somewhat tortuous mid-to-distal esophagus.  Subjectively tight lower esophageal sphincter without underlying mass or stricture seen prograde in distal esophagus or retrograde in proximal stomach.   Widely patent pylorus.  Otherwise normal exam to the second portion of the duodenum.  After conclusion of diagnostic exam, 100units of botulinum toxin were injected in 25U aliquots in 4 quadrants approximately 1-2 cm proximal to the GE junction.              The scope was then withdrawn from the patient and the procedure completed.  COMPLICATIONS: There were no immediate complications.  ENDOSCOPIC IMPRESSION:     As above.  Successful injection of botulinum toxin into the distal esophagus as treatment for achalasia.  RECOMMENDATIONS:     1.  Watch for potential complications of procedure. 2.  Resume Eliquis on Friday, December 2nd. 3.  Follow clinical response to botulinum toxin injection. 4.  Soft diet today, slowly advance diet tomorrow as tolerated. 5.  Follow-up in office in 6-8  weeks.  eSigned:  Arta Silence, MD 2015/09/03 8:17 AM   CC:  CPT CODES: ICD CODES:  The ICD and CPT codes recommended by this software are interpretations from the data that the clinical staff has captured with the software.  The verification of the translation of this report to the ICD and CPT codes and modifiers is the sole responsibility of the health care institution and practicing physician where this report was generated.  [employer] will not be held responsible for the validity of the ICD and CPT codes included on this report.  AMA assumes no liability for data contained or not contained herein. CPT is a Designer, television/film set of the [employer].

## 2015-09-02 NOTE — H&P (Signed)
Patient interval history reviewed.  Patient examined again.  There has been no change from documented H/P dated 08/20/15 (scanned into chart from our office) except as documented above.  Assessment:  1.  Dysphagia. 2.  Achalasia.  Plan:  1.  Endoscopy with botulinum toxin injection. 2.  Risks (bleeding, infection, bowel perforation that could require surgery, sedation-related changes in cardiopulmonary systems), benefits (identification and possible treatment of source of symptoms, exclusion of certain causes of symptoms), and alternatives (watchful waiting, radiographic imaging studies, empiric medical treatment) of upper endoscopy with botulinum toxin injection (EGD with botox) were explained to patient/family in detail and patient wishes to proceed.

## 2015-09-02 NOTE — Discharge Instructions (Signed)

## 2015-09-02 NOTE — Anesthesia Preprocedure Evaluation (Addendum)
Anesthesia Evaluation  Patient identified by MRN, date of birth, ID band Patient awake    Reviewed: Allergy & Precautions, NPO status , Patient's Chart, lab work & pertinent test results, reviewed documented beta blocker date and time   Airway Mallampati: II  TM Distance: >3 FB Neck ROM: Full    Dental  (+) Dental Advisory Given   Pulmonary neg pulmonary ROS,    breath sounds clear to auscultation       Cardiovascular hypertension, Pt. on medications and Pt. on home beta blockers  Rhythm:Irregular Rate:Normal     Neuro/Psych negative neurological ROS     GI/Hepatic Neg liver ROS, GERD  ,  Endo/Other  Hypothyroidism   Renal/GU negative Renal ROS     Musculoskeletal  (+) Arthritis ,   Abdominal   Peds  Hematology negative hematology ROS (+)   Anesthesia Other Findings   Reproductive/Obstetrics                           Anesthesia Physical Anesthesia Plan  ASA: II  Anesthesia Plan: MAC   Post-op Pain Management:    Induction: Intravenous  Airway Management Planned: Nasal Cannula and Natural Airway  Additional Equipment:   Intra-op Plan:   Post-operative Plan:   Informed Consent: I have reviewed the patients History and Physical, chart, labs and discussed the procedure including the risks, benefits and alternatives for the proposed anesthesia with the patient or authorized representative who has indicated his/her understanding and acceptance.     Plan Discussed with: CRNA  Anesthesia Plan Comments:         Anesthesia Quick Evaluation

## 2015-09-02 NOTE — Transfer of Care (Signed)
Immediate Anesthesia Transfer of Care Note  Patient: Lydia Jordan  Procedure(s) Performed: Procedure(s) with comments: ESOPHAGOGASTRODUODENOSCOPY (EGD) WITH PROPOFOL (N/A) - botulinum toxin injection (100 Units; 25 units in 4 quadrants 1-2 cm proximal to GE junction  Patient Location: PACU  Anesthesia Type:MAC  Level of Consciousness: awake, alert  and oriented  Airway & Oxygen Therapy: Patient Spontanous Breathing and Patient connected to nasal cannula oxygen  Post-op Assessment: Report given to RN and Post -op Vital signs reviewed and stable  Post vital signs: Reviewed and stable  Last Vitals:  Filed Vitals:   09/02/15 0717  BP: 163/95  Pulse: 72  Temp: 37.3 C  Resp: 20    Complications: No apparent anesthesia complications

## 2015-09-02 NOTE — Anesthesia Postprocedure Evaluation (Signed)
Anesthesia Post Note  Patient: Lydia Jordan  Procedure(s) Performed: Procedure(s) (LRB): ESOPHAGOGASTRODUODENOSCOPY (EGD) WITH PROPOFOL (N/A)  Patient location during evaluation: PACU Anesthesia Type: MAC Level of consciousness: awake and alert Pain management: satisfactory to patient Vital Signs Assessment: post-procedure vital signs reviewed and stable Respiratory status: spontaneous breathing Cardiovascular status: blood pressure returned to baseline Postop Assessment: no signs of nausea or vomiting Anesthetic complications: no    Last Vitals:  Filed Vitals:   09/02/15 0840 09/02/15 0845  BP: 146/92   Pulse: 80 74  Temp:    Resp: 14 13    Last Pain: There were no vitals filed for this visit.               Tiajuana Amass

## 2015-09-03 ENCOUNTER — Encounter (HOSPITAL_COMMUNITY): Payer: Self-pay | Admitting: Gastroenterology

## 2016-01-07 DIAGNOSIS — K219 Gastro-esophageal reflux disease without esophagitis: Secondary | ICD-10-CM | POA: Diagnosis not present

## 2016-01-07 DIAGNOSIS — I129 Hypertensive chronic kidney disease with stage 1 through stage 4 chronic kidney disease, or unspecified chronic kidney disease: Secondary | ICD-10-CM | POA: Diagnosis not present

## 2016-01-07 DIAGNOSIS — E039 Hypothyroidism, unspecified: Secondary | ICD-10-CM | POA: Diagnosis not present

## 2016-01-07 DIAGNOSIS — I481 Persistent atrial fibrillation: Secondary | ICD-10-CM | POA: Diagnosis not present

## 2016-01-07 DIAGNOSIS — Z1389 Encounter for screening for other disorder: Secondary | ICD-10-CM | POA: Diagnosis not present

## 2016-01-07 DIAGNOSIS — N183 Chronic kidney disease, stage 3 (moderate): Secondary | ICD-10-CM | POA: Diagnosis not present

## 2016-01-07 DIAGNOSIS — K220 Achalasia of cardia: Secondary | ICD-10-CM | POA: Diagnosis not present

## 2016-01-07 DIAGNOSIS — M4802 Spinal stenosis, cervical region: Secondary | ICD-10-CM | POA: Diagnosis not present

## 2016-02-02 DIAGNOSIS — R51 Headache: Secondary | ICD-10-CM | POA: Diagnosis not present

## 2016-02-02 DIAGNOSIS — M5412 Radiculopathy, cervical region: Secondary | ICD-10-CM | POA: Diagnosis not present

## 2016-02-02 DIAGNOSIS — M542 Cervicalgia: Secondary | ICD-10-CM | POA: Diagnosis not present

## 2016-02-03 DIAGNOSIS — M5033 Other cervical disc degeneration, cervicothoracic region: Secondary | ICD-10-CM | POA: Diagnosis not present

## 2016-02-03 DIAGNOSIS — M542 Cervicalgia: Secondary | ICD-10-CM | POA: Diagnosis not present

## 2016-02-03 DIAGNOSIS — M50322 Other cervical disc degeneration at C5-C6 level: Secondary | ICD-10-CM | POA: Diagnosis not present

## 2016-02-09 DIAGNOSIS — M50322 Other cervical disc degeneration at C5-C6 level: Secondary | ICD-10-CM | POA: Diagnosis not present

## 2016-03-03 DIAGNOSIS — M4802 Spinal stenosis, cervical region: Secondary | ICD-10-CM | POA: Diagnosis not present

## 2016-05-12 DIAGNOSIS — M542 Cervicalgia: Secondary | ICD-10-CM | POA: Diagnosis not present

## 2016-07-11 DIAGNOSIS — M4802 Spinal stenosis, cervical region: Secondary | ICD-10-CM | POA: Diagnosis not present

## 2016-07-11 DIAGNOSIS — N183 Chronic kidney disease, stage 3 (moderate): Secondary | ICD-10-CM | POA: Diagnosis not present

## 2016-07-11 DIAGNOSIS — M1288 Other specific arthropathies, not elsewhere classified, other specified site: Secondary | ICD-10-CM | POA: Diagnosis not present

## 2016-07-11 DIAGNOSIS — I129 Hypertensive chronic kidney disease with stage 1 through stage 4 chronic kidney disease, or unspecified chronic kidney disease: Secondary | ICD-10-CM | POA: Diagnosis not present

## 2016-07-11 DIAGNOSIS — K219 Gastro-esophageal reflux disease without esophagitis: Secondary | ICD-10-CM | POA: Diagnosis not present

## 2016-07-19 DIAGNOSIS — M542 Cervicalgia: Secondary | ICD-10-CM | POA: Diagnosis not present

## 2016-07-19 DIAGNOSIS — I10 Essential (primary) hypertension: Secondary | ICD-10-CM | POA: Diagnosis not present

## 2016-07-20 DIAGNOSIS — M47812 Spondylosis without myelopathy or radiculopathy, cervical region: Secondary | ICD-10-CM | POA: Diagnosis not present

## 2016-07-20 DIAGNOSIS — M542 Cervicalgia: Secondary | ICD-10-CM | POA: Diagnosis not present

## 2016-07-25 DIAGNOSIS — M47812 Spondylosis without myelopathy or radiculopathy, cervical region: Secondary | ICD-10-CM | POA: Diagnosis not present

## 2016-08-01 DIAGNOSIS — M47812 Spondylosis without myelopathy or radiculopathy, cervical region: Secondary | ICD-10-CM | POA: Diagnosis not present

## 2016-08-01 DIAGNOSIS — M542 Cervicalgia: Secondary | ICD-10-CM | POA: Diagnosis not present

## 2016-08-23 DIAGNOSIS — M542 Cervicalgia: Secondary | ICD-10-CM | POA: Diagnosis not present

## 2016-08-23 DIAGNOSIS — M47812 Spondylosis without myelopathy or radiculopathy, cervical region: Secondary | ICD-10-CM | POA: Diagnosis not present

## 2016-09-06 DIAGNOSIS — M47812 Spondylosis without myelopathy or radiculopathy, cervical region: Secondary | ICD-10-CM | POA: Diagnosis not present

## 2016-09-06 DIAGNOSIS — Z6822 Body mass index (BMI) 22.0-22.9, adult: Secondary | ICD-10-CM | POA: Diagnosis not present

## 2016-09-06 DIAGNOSIS — I10 Essential (primary) hypertension: Secondary | ICD-10-CM | POA: Diagnosis not present

## 2016-10-11 DIAGNOSIS — M47812 Spondylosis without myelopathy or radiculopathy, cervical region: Secondary | ICD-10-CM | POA: Diagnosis not present

## 2016-11-15 DIAGNOSIS — M47812 Spondylosis without myelopathy or radiculopathy, cervical region: Secondary | ICD-10-CM | POA: Diagnosis not present

## 2016-11-15 DIAGNOSIS — I10 Essential (primary) hypertension: Secondary | ICD-10-CM | POA: Diagnosis not present

## 2017-01-03 DIAGNOSIS — M47812 Spondylosis without myelopathy or radiculopathy, cervical region: Secondary | ICD-10-CM | POA: Diagnosis not present

## 2017-01-03 DIAGNOSIS — I10 Essential (primary) hypertension: Secondary | ICD-10-CM | POA: Diagnosis not present

## 2017-01-10 ENCOUNTER — Inpatient Hospital Stay (HOSPITAL_COMMUNITY)
Admission: EM | Admit: 2017-01-10 | Discharge: 2017-01-12 | DRG: 264 | Disposition: A | Discharge status: Home / Self Care | Payer: Medicare Other | Attending: Cardiovascular Disease | Admitting: Cardiovascular Disease

## 2017-01-10 ENCOUNTER — Encounter (HOSPITAL_COMMUNITY): Payer: Self-pay | Admitting: Emergency Medicine

## 2017-01-10 ENCOUNTER — Emergency Department (HOSPITAL_COMMUNITY): Payer: Medicare Other

## 2017-01-10 DIAGNOSIS — Z8249 Family history of ischemic heart disease and other diseases of the circulatory system: Secondary | ICD-10-CM

## 2017-01-10 DIAGNOSIS — E039 Hypothyroidism, unspecified: Secondary | ICD-10-CM | POA: Diagnosis present

## 2017-01-10 DIAGNOSIS — Z7901 Long term (current) use of anticoagulants: Secondary | ICD-10-CM

## 2017-01-10 DIAGNOSIS — M19012 Primary osteoarthritis, left shoulder: Secondary | ICD-10-CM | POA: Diagnosis not present

## 2017-01-10 DIAGNOSIS — R079 Chest pain, unspecified: Secondary | ICD-10-CM | POA: Diagnosis not present

## 2017-01-10 DIAGNOSIS — I4811 Longstanding persistent atrial fibrillation: Secondary | ICD-10-CM

## 2017-01-10 DIAGNOSIS — R778 Other specified abnormalities of plasma proteins: Secondary | ICD-10-CM | POA: Diagnosis present

## 2017-01-10 DIAGNOSIS — I10 Essential (primary) hypertension: Secondary | ICD-10-CM | POA: Diagnosis present

## 2017-01-10 DIAGNOSIS — Z885 Allergy status to narcotic agent status: Secondary | ICD-10-CM

## 2017-01-10 DIAGNOSIS — Z79899 Other long term (current) drug therapy: Secondary | ICD-10-CM

## 2017-01-10 DIAGNOSIS — I482 Chronic atrial fibrillation: Principal | ICD-10-CM | POA: Diagnosis present

## 2017-01-10 DIAGNOSIS — K219 Gastro-esophageal reflux disease without esophagitis: Secondary | ICD-10-CM | POA: Diagnosis present

## 2017-01-10 DIAGNOSIS — R040 Epistaxis: Secondary | ICD-10-CM | POA: Diagnosis not present

## 2017-01-10 DIAGNOSIS — Z9889 Other specified postprocedural states: Secondary | ICD-10-CM

## 2017-01-10 DIAGNOSIS — Z79891 Long term (current) use of opiate analgesic: Secondary | ICD-10-CM

## 2017-01-10 DIAGNOSIS — M542 Cervicalgia: Secondary | ICD-10-CM | POA: Diagnosis present

## 2017-01-10 DIAGNOSIS — I2510 Atherosclerotic heart disease of native coronary artery without angina pectoris: Secondary | ICD-10-CM | POA: Diagnosis present

## 2017-01-10 DIAGNOSIS — G8929 Other chronic pain: Secondary | ICD-10-CM | POA: Diagnosis present

## 2017-01-10 DIAGNOSIS — I214 Non-ST elevation (NSTEMI) myocardial infarction: Secondary | ICD-10-CM | POA: Diagnosis not present

## 2017-01-10 DIAGNOSIS — R0602 Shortness of breath: Secondary | ICD-10-CM | POA: Diagnosis not present

## 2017-01-10 DIAGNOSIS — M19011 Primary osteoarthritis, right shoulder: Secondary | ICD-10-CM | POA: Diagnosis not present

## 2017-01-10 DIAGNOSIS — I517 Cardiomegaly: Secondary | ICD-10-CM | POA: Diagnosis not present

## 2017-01-10 DIAGNOSIS — I700 Atherosclerosis of aorta: Secondary | ICD-10-CM | POA: Diagnosis not present

## 2017-01-10 DIAGNOSIS — I4891 Unspecified atrial fibrillation: Secondary | ICD-10-CM | POA: Diagnosis not present

## 2017-01-10 DIAGNOSIS — M79609 Pain in unspecified limb: Secondary | ICD-10-CM | POA: Diagnosis not present

## 2017-01-10 HISTORY — DX: Non-ST elevation (NSTEMI) myocardial infarction: I21.4

## 2017-01-10 LAB — CBC WITH DIFFERENTIAL/PLATELET
BASOS ABS: 0 10*3/uL (ref 0.0–0.1)
BASOS PCT: 0 %
BASOS PCT: 0 %
Basophils Absolute: 0 10*3/uL (ref 0.0–0.1)
EOS ABS: 0.2 10*3/uL (ref 0.0–0.7)
Eosinophils Absolute: 0.1 10*3/uL (ref 0.0–0.7)
Eosinophils Relative: 1 %
Eosinophils Relative: 1 %
HEMATOCRIT: 36.8 % (ref 36.0–46.0)
HEMATOCRIT: 34.5 % — AB (ref 36.0–46.0)
HEMOGLOBIN: 12.7 g/dL (ref 12.0–15.0)
Hemoglobin: 11.6 g/dL — ABNORMAL LOW (ref 12.0–15.0)
Lymphocytes Relative: 10 %
Lymphocytes Relative: 15 %
Lymphs Abs: 1.8 10*3/uL (ref 0.7–4.0)
Lymphs Abs: 2 10*3/uL (ref 0.7–4.0)
MCH: 34 pg (ref 26.0–34.0)
MCH: 32.9 pg (ref 26.0–34.0)
MCHC: 34.5 g/dL (ref 30.0–36.0)
MCHC: 33.6 g/dL (ref 30.0–36.0)
MCV: 98.4 fL (ref 78.0–100.0)
MCV: 97.7 fL (ref 78.0–100.0)
MONO ABS: 1.8 10*3/uL — AB (ref 0.1–1.0)
MONOS PCT: 12 %
MONOS PCT: 13 %
Monocytes Absolute: 2 10*3/uL — ABNORMAL HIGH (ref 0.1–1.0)
NEUTROS ABS: 12.9 10*3/uL — AB (ref 1.7–7.7)
NEUTROS ABS: 10.1 10*3/uL — AB (ref 1.7–7.7)
NEUTROS PCT: 77 %
Neutrophils Relative %: 71 %
PLATELETS: 264 10*3/uL (ref 150–400)
Platelets: 285 10*3/uL (ref 150–400)
RBC: 3.74 MIL/uL — ABNORMAL LOW (ref 3.87–5.11)
RBC: 3.53 MIL/uL — ABNORMAL LOW (ref 3.87–5.11)
RDW: 13.2 % (ref 11.5–15.5)
RDW: 13.1 % (ref 11.5–15.5)
WBC: 16.8 10*3/uL — ABNORMAL HIGH (ref 4.0–10.5)
WBC: 14.1 10*3/uL — ABNORMAL HIGH (ref 4.0–10.5)

## 2017-01-10 LAB — I-STAT TROPONIN, ED: Troponin i, poc: 0.48 ng/mL (ref 0.00–0.08)

## 2017-01-10 LAB — CREATININE, SERUM
CREATININE: 1.34 mg/dL — AB (ref 0.44–1.00)
GFR, EST AFRICAN AMERICAN: 42 mL/min — AB (ref >60)
GFR, EST NON AFRICAN AMERICAN: 36 mL/min — AB (ref >60)

## 2017-01-10 LAB — BASIC METABOLIC PANEL
ANION GAP: 10 (ref 5–15)
BUN: 27 mg/dL — ABNORMAL HIGH (ref 6–20)
CHLORIDE: 93 mmol/L — AB (ref 101–111)
CO2: 26 mmol/L (ref 22–32)
Calcium: 9.7 mg/dL (ref 8.9–10.3)
Creatinine, Ser: 1.14 mg/dL — ABNORMAL HIGH (ref 0.44–1.00)
GFR calc non Af Amer: 44 mL/min — ABNORMAL LOW (ref >60)
GFR, EST AFRICAN AMERICAN: 51 mL/min — AB (ref >60)
Glucose, Bld: 150 mg/dL — ABNORMAL HIGH (ref 65–99)
POTASSIUM: 4.3 mmol/L (ref 3.5–5.1)
Sodium: 129 mmol/L — ABNORMAL LOW (ref 135–145)

## 2017-01-10 LAB — CBG MONITORING, ED: GLUCOSE-CAPILLARY: 144 mg/dL — AB (ref 65–99)

## 2017-01-10 LAB — TROPONIN I
TROPONIN I: 0.63 ng/mL — AB (ref <0.03)
TROPONIN I: 0.69 ng/mL — AB (ref <0.03)
TROPONIN I: 0.54 ng/mL — AB (ref <0.03)
Troponin I: 0.84 ng/mL (ref <0.03)

## 2017-01-10 LAB — PROTIME-INR
INR: 1.53
Prothrombin Time: 18.6 seconds — ABNORMAL HIGH (ref 11.4–15.2)

## 2017-01-10 LAB — MRSA PCR SCREENING: MRSA by PCR: NEGATIVE

## 2017-01-10 MED ORDER — HEPARIN SODIUM (PORCINE) 5000 UNIT/ML IJ SOLN
5000.0000 [IU] | Freq: Three times a day (TID) | INTRAMUSCULAR | Status: DC
  Administered 2017-01-10 – 2017-01-12 (×5): 5000 [IU] via SUBCUTANEOUS
  Filled 2017-01-10 (×6): qty 1

## 2017-01-10 MED ORDER — ACETAMINOPHEN 325 MG PO TABS: 650.0000 mg | ORAL_TABLET | ORAL | Status: DC | PRN

## 2017-01-10 MED ORDER — NITROGLYCERIN 0.4 MG SL SUBL
0.4000 mg | SUBLINGUAL_TABLET | SUBLINGUAL | Status: DC | PRN
  Administered 2017-01-11: 0.4 mg via SUBLINGUAL
  Filled 2017-01-10: qty 1

## 2017-01-10 MED ORDER — LIDOCAINE HCL 2 % EX GEL
1.0000 "application " | Freq: Once | CUTANEOUS | Status: DC | PRN
  Filled 2017-01-10: qty 5

## 2017-01-10 MED ORDER — SODIUM CHLORIDE 0.9% FLUSH
3.0000 mL | Freq: Two times a day (BID) | INTRAVENOUS | Status: DC
  Administered 2017-01-10 – 2017-01-11 (×3): 3 mL via INTRAVENOUS

## 2017-01-10 MED ORDER — ONDANSETRON HCL 4 MG/2ML IJ SOLN: 4.0000 mg | Freq: Four times a day (QID) | INTRAMUSCULAR | Status: DC | PRN

## 2017-01-10 MED ORDER — BACITRACIN ZINC 500 UNIT/GM EX OINT
1.0000 "application " | TOPICAL_OINTMENT | Freq: Two times a day (BID) | CUTANEOUS | Status: DC
  Administered 2017-01-10 – 2017-01-12 (×5): 1 via TOPICAL
  Filled 2017-01-10: qty 28.35

## 2017-01-10 MED ORDER — COCAINE HCL 4 % EX SOLN
4.0000 mL | Freq: Once | CUTANEOUS | Status: AC
  Administered 2017-01-10: 4 mL via NASAL
  Filled 2017-01-10: qty 4

## 2017-01-10 MED ORDER — SODIUM CHLORIDE 0.9 % IV SOLN: 250.0000 mL | INTRAVENOUS | Status: DC | PRN

## 2017-01-10 MED ORDER — TRIPLE ANTIBIOTIC 3.5-400-5000 EX OINT: 1.0000 "application " | TOPICAL_OINTMENT | Freq: Once | CUTANEOUS | Status: DC | PRN

## 2017-01-10 MED ORDER — SALINE SPRAY 0.65 % NA SOLN
2.0000 | NASAL | Status: DC | PRN
  Filled 2017-01-10: qty 44

## 2017-01-10 MED ORDER — ASPIRIN 81 MG PO CHEW
324.0000 mg | CHEWABLE_TABLET | Freq: Once | ORAL | Status: AC
  Administered 2017-01-10: 324 mg via ORAL
  Filled 2017-01-10: qty 4

## 2017-01-10 MED ORDER — LIDOCAINE-EPINEPHRINE (PF) 1 %-1:200000 IJ SOLN: 0.0000 mL | Freq: Once | INTRAMUSCULAR | Status: DC | PRN

## 2017-01-10 MED ORDER — DICLOFENAC SODIUM 50 MG PO TBEC
50.0000 mg | DELAYED_RELEASE_TABLET | Freq: Two times a day (BID) | ORAL | Status: DC
  Administered 2017-01-10 – 2017-01-12 (×3): 50 mg via ORAL
  Filled 2017-01-10 (×2): qty 1
  Filled 2017-01-10 (×3): qty 2
  Filled 2017-01-10: qty 1

## 2017-01-10 MED ORDER — SODIUM CHLORIDE 0.9% FLUSH: 3.0000 mL | INTRAVENOUS | Status: DC | PRN

## 2017-01-10 MED ORDER — PANTOPRAZOLE SODIUM 40 MG PO TBEC
40.0000 mg | DELAYED_RELEASE_TABLET | Freq: Every day | ORAL | Status: DC
  Administered 2017-01-10 – 2017-01-12 (×3): 40 mg via ORAL
  Filled 2017-01-10 (×3): qty 1

## 2017-01-10 MED ORDER — DULOXETINE HCL 60 MG PO CPEP
60.0000 mg | ORAL_CAPSULE | Freq: Every day | ORAL | Status: DC
  Administered 2017-01-10 – 2017-01-12 (×3): 60 mg via ORAL
  Filled 2017-01-10 (×3): qty 1

## 2017-01-10 MED ORDER — SODIUM CHLORIDE 0.9 % WEIGHT BASED INFUSION
1.0000 mL/kg/h | INTRAVENOUS | Status: DC
Start: 2017-01-11 — End: 2017-01-11

## 2017-01-10 MED ORDER — DULOXETINE HCL 30 MG PO CPEP
30.0000 mg | ORAL_CAPSULE | Freq: Every day | ORAL | Status: DC
  Administered 2017-01-10 – 2017-01-11 (×2): 30 mg via ORAL
  Filled 2017-01-10 (×2): qty 1

## 2017-01-10 MED ORDER — HYDROCODONE-ACETAMINOPHEN 5-325 MG PO TABS
1.0000 | ORAL_TABLET | Freq: Two times a day (BID) | ORAL | Status: DC
  Administered 2017-01-10 – 2017-01-12 (×4): 1 via ORAL
  Filled 2017-01-10 (×4): qty 1

## 2017-01-10 MED ORDER — NEBIVOLOL HCL 5 MG PO TABS
5.0000 mg | ORAL_TABLET | Freq: Every day | ORAL | Status: DC
  Administered 2017-01-10 – 2017-01-12 (×3): 5 mg via ORAL
  Filled 2017-01-10 (×3): qty 1

## 2017-01-10 MED ORDER — LEVOTHYROXINE SODIUM 100 MCG PO TABS
100.0000 ug | ORAL_TABLET | Freq: Every day | ORAL | Status: DC
  Administered 2017-01-10 – 2017-01-12 (×3): 100 ug via ORAL
  Filled 2017-01-10 (×3): qty 1

## 2017-01-10 MED ORDER — SODIUM CHLORIDE 0.9% FLUSH: 3.0000 mL | Freq: Two times a day (BID) | INTRAVENOUS | Status: DC

## 2017-01-10 MED ORDER — ASPIRIN EC 81 MG PO TBEC
81.0000 mg | DELAYED_RELEASE_TABLET | Freq: Every day | ORAL | Status: DC
  Administered 2017-01-12: 81 mg via ORAL
  Filled 2017-01-10: qty 1

## 2017-01-10 MED ORDER — OXYMETAZOLINE HCL 0.05 % NA SOLN: 1.0000 | Freq: Once | NASAL | Status: DC | PRN

## 2017-01-10 MED ORDER — ASPIRIN 81 MG PO CHEW
81.0000 mg | CHEWABLE_TABLET | ORAL | Status: AC
  Administered 2017-01-11: 81 mg via ORAL
  Filled 2017-01-10: qty 1

## 2017-01-10 MED ORDER — OXYMETAZOLINE HCL 0.05 % NA SOLN
1.0000 | Freq: Once | NASAL | Status: AC
  Administered 2017-01-10: 1 via NASAL
  Filled 2017-01-10: qty 15

## 2017-01-10 MED ORDER — SODIUM CHLORIDE 0.9 % WEIGHT BASED INFUSION
3.0000 mL/kg/h | INTRAVENOUS | Status: DC
  Administered 2017-01-11: 3 mL/kg/h via INTRAVENOUS

## 2017-01-10 MED ORDER — SILVER NITRATE-POT NITRATE 75-25 % EX MISC
1.0000 | Freq: Once | CUTANEOUS | Status: DC | PRN
  Filled 2017-01-10: qty 1

## 2017-01-10 MED ORDER — LIDOCAINE HCL 4 % EX SOLN: 0.0000 mL | Freq: Once | CUTANEOUS | Status: DC | PRN

## 2017-01-10 MED ORDER — LIDOCAINE VISCOUS 2 % MT SOLN
OROMUCOSAL | Status: AC
  Filled 2017-01-10: qty 15

## 2017-01-10 MED ORDER — DIGOXIN 125 MCG PO TABS
0.1250 mg | ORAL_TABLET | Freq: Every day | ORAL | Status: DC
  Administered 2017-01-10 – 2017-01-12 (×3): 0.125 mg via ORAL
  Filled 2017-01-10 (×3): qty 1

## 2017-01-10 NOTE — ED Notes (Signed)
Report given to Hildred Alamin, Therapist, sports at [facility].

## 2017-01-10 NOTE — ED Notes (Signed)
No active bleeding from nare after administration of afrin and pressure. Dr.Ward notified.

## 2017-01-10 NOTE — ED Notes (Signed)
Attempted to call report but nurse was not available. She will call back in 15 minutes.

## 2017-01-10 NOTE — ED Notes (Signed)
Report attempted to be called and was advised to call back in appx 30 min after shift change.

## 2017-01-10 NOTE — Progress Notes (Signed)
Lab called with critical troponin 0.63. Paged team D cardiologist to notify. [facility] RN

## 2017-01-10 NOTE — Consult Note (Signed)
Lydia Jordan, Lydia Jordan 83 y.o., female [id number]     Chief Complaint: epistaxis  HPI: 83 yo wf, hx AFib, on Eliquis x 1 yr.  Onset episodic nosebleeds last couple of weeks.  Had cautery some years back on the RIGHT.  Now with more frequent RIGHT anterior epistaxis.  No other bleeding issues.  Hospitalized for possible NSTEMI.  No nasal issues.  Had double balloon pack placed in [facility] this AM.  No nasal cannula O2 at home  PMH: Past Medical History:  Diagnosis Date  . Acid reflux   . Arthritis   . Atrial fibrillation ([place])   . Hypertension   . Hypothyroidism   . Irregular heart rhythm   . Thyroid disease     Surg Hx: Past Surgical History:  Procedure Laterality Date  . ESOPHAGEAL MANOMETRY N/A 07/31/2015   Procedure: ESOPHAGEAL MANOMETRY (EM);  Surgeon: Arta Silence, MD;  Location: [facility];  Service: Endoscopy;  Laterality: N/A;  . ESOPHAGOGASTRODUODENOSCOPY (EGD) WITH PROPOFOL N/A 09/02/2015   Procedure: ESOPHAGOGASTRODUODENOSCOPY (EGD) WITH PROPOFOL;  Surgeon: Arta Silence, MD;  Location: [facility];  Service: Endoscopy;  Laterality: N/A;  botulinum toxin injection (100 Units; 25 units in 4 quadrants 1-2 cm proximal to GE junction  . skin cancer area removed      right leg healing well  . VAGINAL HYSTERECTOMY      FHx:  No family history on file. SocHx:  reports that she has never smoked. She has never used smokeless tobacco. She reports that she does not drink alcohol or use drugs.  ALLERGIES:  Allergies  Allergen Reactions  . Demerol [Meperidine] Anaphylaxis    Reaction: "Heart stopped"    Medications Prior to Admission  Medication Sig Dispense Refill  . apixaban (ELIQUIS) 5 MG TABS tablet Take 1 tablet (5 mg total) by mouth 2 (two) times daily.    . diclofenac (CATAFLAM) 50 MG tablet Take 50 mg by mouth 2 (two) times daily.    . digoxin (LANOXIN) 0.125 MG tablet Take 0.125 mg by mouth every morning.    . DULoxetine (CYMBALTA) 30 MG capsule Take 30 mg by mouth  at bedtime.     . DULoxetine (CYMBALTA) 60 MG capsule Take 60 mg by mouth daily.  1  . HYDROcodone-acetaminophen (NORCO/VICODIN) 5-325 MG tablet Take 1 tablet by mouth 2 (two) times daily. Pain    . Liniments (SALONPAS PAIN RELIEF PATCH EX) Apply 1 patch topically daily as needed. For muscle pain    . nebivolol (BYSTOLIC) 5 MG tablet Take 5 mg by mouth every morning.    Marland Kitchen omeprazole (PRILOSEC) 40 MG capsule Take 40 mg by mouth at bedtime.    Marland Kitchen SYNTHROID 100 MCG tablet Take 100 mcg by mouth daily before breakfast.  11    Results for orders placed or performed during the hospital encounter of 01/10/17 (from the past 48 hour(s))  CBG monitoring, ED     Status: Abnormal   Collection Time: 01/10/17  4:10 AM  Result Value Ref Range   Glucose-Capillary 144 (H) 65 - 99 mg/dL  CBC with Differential     Status: Abnormal   Collection Time: 01/10/17  4:24 AM  Result Value Ref Range   WBC 16.8 (H) 4.0 - 10.5 K/uL   RBC 3.74 (L) 3.87 - 5.11 MIL/uL   Hemoglobin 12.7 12.0 - 15.0 g/dL   HCT 36.8 36.0 - 46.0 %   MCV 98.4 78.0 - 100.0 fL   MCH 34.0 26.0 - 34.0 pg   MCHC  34.5 30.0 - 36.0 g/dL   RDW 13.2 11.5 - 15.5 %   Platelets 285 150 - 400 K/uL   Neutrophils Relative % 77 %   Neutro Abs 12.9 (H) 1.7 - 7.7 K/uL   Lymphocytes Relative 10 %   Lymphs Abs 1.8 0.7 - 4.0 K/uL   Monocytes Relative 12 %   Monocytes Absolute 2.0 (H) 0.1 - 1.0 K/uL   Eosinophils Relative 1 %   Eosinophils Absolute 0.1 0.0 - 0.7 K/uL   Basophils Relative 0 %   Basophils Absolute 0.0 0.0 - 0.1 K/uL  Basic metabolic panel     Status: Abnormal   Collection Time: 01/10/17  4:24 AM  Result Value Ref Range   Sodium 129 (L) 135 - 145 mmol/L   Potassium 4.3 3.5 - 5.1 mmol/L   Chloride 93 (L) 101 - 111 mmol/L   CO2 26 22 - 32 mmol/L   Glucose, Bld 150 (H) 65 - 99 mg/dL   BUN 27 (H) 6 - 20 mg/dL   Creatinine, Ser 1.14 (H) 0.44 - 1.00 mg/dL   Calcium 9.7 8.9 - 10.3 mg/dL   GFR calc non Af Amer 44 (L) >60 mL/min   GFR calc Af  Amer 51 (L) >60 mL/min    Comment: (NOTE) The eGFR has been calculated using the CKD EPI equation. This calculation has not been validated in all clinical situations. eGFR's persistently <60 mL/min signify possible Chronic Kidney Disease.    Anion gap 10 5 - 15  Troponin I     Status: Abnormal   Collection Time: 01/10/17  4:24 AM  Result Value Ref Range   Troponin I 0.84 (HH) <0.03 ng/mL    Comment: CRITICAL RESULT CALLED TO, READ BACK BY AND VERIFIED WITH: T.DOSTER,RN 0534 01/10/17 W.SHEA   I-stat troponin, ED     Status: Abnormal   Collection Time: 01/10/17  4:44 AM  Result Value Ref Range   Troponin i, poc 0.48 (HH) 0.00 - 0.08 ng/mL   Comment NOTIFIED PHYSICIAN    Comment 3            Comment: Due to the release kinetics of cTnI, a negative result within the first hours of the onset of symptoms does not rule out myocardial infarction with certainty. If myocardial infarction is still suspected, repeat the test at appropriate intervals.   MRSA PCR Screening     Status: None   Collection Time: 01/10/17  9:44 AM  Result Value Ref Range   MRSA by PCR NEGATIVE NEGATIVE    Comment:        The GeneXpert MRSA Assay (FDA approved for NASAL specimens only), is one component of a comprehensive MRSA colonization surveillance program. It is not intended to diagnose MRSA infection nor to guide or monitor treatment for MRSA infections.    Dg Chest Portable 1 View  Result Date: 01/10/2017 CLINICAL DATA:  Nosebleed.  Chest pain shortness of breath. EXAM: PORTABLE CHEST 1 VIEW COMPARISON:  None. FINDINGS: Cardiomediastinal silhouette is mildly enlarged with calcification within the aortic arch. There is no pulmonary edema or focal airspace consolidation. No pneumothorax or pleural effusion. There is moderate bilateral shoulder osteoarthrosis. IMPRESSION: 1. No active cardiopulmonary disease. 2. Mild cardiomegaly and aortic atherosclerosis. Electronically Signed   By: Ulyses Jarred M.D.    On: 01/10/2017 05:12      Blood pressure (!) 132/96, pulse (!) 112, temperature 97.7 F (36.5 C), resp. rate 18, height 5' 3"  (1.6 m), weight 56.5 kg (124 lb  9 oz), SpO2 98 %.  PHYSICAL EXAM: Overall appearance:  Alert, animated.  Mental status intact Head:  NCAT Ears:  clear Nose: balloon pack RIGHT.  Upon removal, oozing vessel on mid anterior septum.  LEFT side sl dry Oral Cavity:  Upper plate. Partial lower plate. Oral Pharynx/Hypopharynx/Larynx  No blood Neuro:  Grossly intact Neck:  clear      Assessment/Plan RIGHT anterior septal coagulopathic epistaxis  Plan:  With informed consent, anesthetized the anterior septum with cocaine solution, 4%, 2 ml total.  Silver nitrate cautery performed with good tolerance and good control.  Nasal hygiene measures explained.  Recheck my office prn.  Jodi Marble 11/25/3610, 12:44 PM

## 2017-01-10 NOTE — ED Notes (Signed)
Pt began having new bleeding from right nare and Dr.Ward notified. Order to have pt blow nose and administer Afrin to both nares. After administration of orders pt had decrease in bleeding. Pressure applied to nose and to be reevaluated.

## 2017-01-10 NOTE — H&P (Addendum)
Patient ID: ANYJAH ROUNDTREE MRN: [id number], DOB/AGE: 1934-08-01   Admit date: 01/10/2017  Requesting Physician: Dr. Erasmo Downer Ward  Primary Physician: Irven Shelling, MD Primary Cardiologist:  New (Dr. Acie Fredrickson Reason for admission: NSTEMI Consults: ENT- Epistaxis  HPI:  Lydia Jordan is a 83 y.o. female with a history of atrial fibrillation (diagnosed approx 2007) on Eliquis, hypertension, hypothyroidism, acid reflux, chronic pain. She presented to the ER last night (4/9) for epistaxis. She denies having any chest pains in the past but she does suffer from GERD and chronic neck pain. She had an echo done in 2007 after diagnosis for atrial fibrillation which revealed normal left ventricular systolic function. LVEF 55%, no wall motion abnormalities. Her primary care doctor has been managing her a.fib with Digoxin, Eliquis and Bystolic. She denies having any sensation of palpitations.   On Sunday she had severe intermittent episodes of diffuse chest pain that radiated into her left jaw. She felt fatigued and short of breath. She also had associated nausea, vomiting and epistaxis. She does not remember the day well because she felt so poorly. She described the pain as ongoing all day. She has not had any further chest pain since Sunday but continues to have fatigue and nosebleeds. She is not currently having shortness of breath or chest pain. Her nose continued to bleed throughout her stay in the ED therefore a double bubble posterior packing was placed in her left nostril.  ENT- Dr. Erik Obey has been requested to consult regarding nosebleed.  In the ER her EKG showed atrial fibrillation HR 87. Troponin I resulted 0.84, her chest xray clear, hemoglobin is 12.7, blood pressure 119/77. She is currently chest pain free. Per note review patient accepted by Dr. Aundra Dubin for transfer to [facility] for cardiology admission.  Problem List  Past Medical History:  Diagnosis Date  .  Acid reflux   . Arthritis   . Atrial fibrillation ([place])   . Hypertension   . Hypothyroidism   . Irregular heart rhythm   . Thyroid disease     Past Surgical History:  Procedure Laterality Date  . ESOPHAGEAL MANOMETRY N/A 07/31/2015   Procedure: ESOPHAGEAL MANOMETRY (EM);  Surgeon: Arta Silence, MD;  Location: [facility];  Service: Endoscopy;  Laterality: N/A;  . ESOPHAGOGASTRODUODENOSCOPY (EGD) WITH PROPOFOL N/A 09/02/2015   Procedure: ESOPHAGOGASTRODUODENOSCOPY (EGD) WITH PROPOFOL;  Surgeon: Arta Silence, MD;  Location: [facility];  Service: Endoscopy;  Laterality: N/A;  botulinum toxin injection (100 Units; 25 units in 4 quadrants 1-2 cm proximal to GE junction  . LEFT HEART CATH AND CORONARY ANGIOGRAPHY N/A 01/11/2017   Procedure: Left Heart Cath and Coronary Angiography;  Surgeon: Leonie Man, MD;  Location: [facility];  Service: Cardiovascular;  Laterality: N/A;  . skin cancer area removed      right leg healing well  . VAGINAL HYSTERECTOMY       Allergies  Allergies  Allergen Reactions  . Demerol [Meperidine] Anaphylaxis    Reaction: "Heart stopped"     Home Medications  Prior to Admission medications   Medication Sig Start Date End Date Taking? Authorizing Provider  apixaban (ELIQUIS) 5 MG TABS tablet Take 1 tablet (5 mg total) by mouth 2 (two) times daily. 09/04/15  Yes Arta Silence, MD  diclofenac (CATAFLAM) 50 MG tablet Take 50 mg by mouth 2 (two) times daily.   Yes Historical Provider, MD  digoxin (LANOXIN) 0.125 MG tablet Take 0.125 mg by mouth every morning.  Yes Historical Provider, MD  DULoxetine (CYMBALTA) 30 MG capsule Take 30 mg by mouth at bedtime.    Yes Historical Provider, MD  DULoxetine (CYMBALTA) 60 MG capsule Take 60 mg by mouth daily. 01/02/17  Yes Historical Provider, MD  HYDROcodone-acetaminophen (NORCO/VICODIN) 5-325 MG tablet Take 1 tablet by mouth 2 (two) times daily. Pain   Yes Historical Provider, MD  Liniments (SALONPAS PAIN  RELIEF PATCH EX) Apply 1 patch topically daily as needed. For muscle pain   Yes Historical Provider, MD  nebivolol (BYSTOLIC) 5 MG tablet Take 5 mg by mouth every morning.   Yes Historical Provider, MD  omeprazole (PRILOSEC) 40 MG capsule Take 40 mg by mouth at bedtime.   Yes Historical Provider, MD  SYNTHROID 100 MCG tablet Take 100 mcg by mouth daily before breakfast. 06/26/15  Yes Historical Provider, MD    Family History  Family History  Problem Relation Age of Onset  . Hypertension Mother   . Cerebral aneurysm Father      Family Status  Relation Status  . Mother   . Father      Social History  Social History   Social History  . Marital status: Single    Spouse name: N/A  . Number of children: N/A  . Years of education: N/A   Occupational History  . Not on file.   Social History Main Topics  . Smoking status: Never Smoker  . Smokeless tobacco: Never Used  . Alcohol use No  . Drug use: No  . Sexual activity: Not on file   Other Topics Concern  . Not on file   Social History Narrative  . No narrative on file     Review of Systems General:  No chills, fever, night sweats or weight changes.  Cardiovascular:  No edema, orthopnea, palpitations, paroxysmal nocturnal dyspnea. Dermatological: No rash, lesions/masses Respiratory: No cough, dyspnea Urologic: No hematuria, dysuria Abdominal:   No diarrhea, bright red blood per rectum, melena, or hematemesis Neurologic:  No visual changes, wkns, changes in mental status. All other systems reviewed and are otherwise negative except as noted above.  Physical Exam  Blood pressure 105/62, pulse (!) 58, temperature 97.8 F (36.6 C), temperature source Oral, resp. rate 14, height 5\' 3"  (1.6 m), weight 125 lb 9.6 oz (57 kg), SpO2 100 %.  General: Pleasant, NAD Psych: Normal affect. Neuro: Alert and oriented X 3. Moves all extremities spontaneously. HEENT: nasal packing into left nostril  Neck: Supple without bruits or  JVD. Lungs:  Resp regular and unlabored, CTA. Heart: RRR no s3, s4, or murmurs. Abdomen: Soft, non-tender, non-distended, BS + x 4.  Extremities: No clubbing, cyanosis or edema. DP/PT/Radials 2+ and equal bilaterally. Feet are cold, unable to manually palpate pedal pulse.  Labs   Recent Labs  01/10/17 0424 01/10/17 1130 01/10/17 1825 01/10/17 2253  TROPONINI 0.84* 0.63* 0.69* 0.54*   Lab Results  Component Value Date   WBC 11.9 (H) 01/11/2017   HGB 10.4 (L) 01/11/2017   HCT 31.2 (L) 01/11/2017   MCV 97.8 01/11/2017   PLT 210 01/11/2017     Recent Labs Lab 01/11/17 0237  NA 131*  K 3.9  CL 94*  CO2 28  BUN 35*  CREATININE 1.22*  CALCIUM 9.0  GLUCOSE 119*   Radiology/Studies  Dg Chest Portable 1 View Result Date: 01/10/2017 1. No active cardiopulmonary disease. 2. Mild cardiomegaly and aortic atherosclerosis.    ECG  HR 87, atrial fibrillation with nonspecific T-wave changes  ASSESSMENT  AND PLAN  1. NSTEMI- elevated Troponin at 0.84 in the setting of CP two days ago. Last dose of Eliquis was 4/9 at night. This is an atypical presentation for cardiac chest pain however the patient has had no cardiac work-up and an elevated Troponin and will need a cardiac echo and cardiac cath during this admission.  -- Will continue to cycle Troponins. If clinically able, will need 48 washout of Eliquis before cath.  -- She does not appear fluid overload but will monitor daily weight and I/O's  2.Hypertension- Bystolic home medication, BP well managed.  3. Chronic atrial fibrillation- Unclear if patient comes in and out of this rhythm. Takes Digoxin and Eliquis at home. Currently Heparin and Eliquis are being held due to Epistaxis. Will need to restart on anticoagulation once bleeding is controlled.  4. Epistaxis- ENT, Dr. Erik Obey has been consulted to manage nose bleed and packing. Pt will need to be on anticoagulation which he will start when bleeding is controlled. --Trend  Hemoglobins  Pt likely needs a cardiac catheterization but will need management of nose bleed, per ENT and washout of her Eliquis. Will also need echocardiogram. Will monitor hemoglobins and cycle troponins. Will discuss case with Dr. Acie Fredrickson to discuss patient course/plan. Will continue all home medications except for the Eliquis. Pt on scheduled for cath tomorrow (4/11) at 3pm  Signed, Mertie Moores, PA-C 01/12/2017, 1:51 PM  Attending Note:   The patient was seen and examined.  Agree with assessment and plan as noted above.  Changes made to the above note as needed.  Patient seen and independently examined with Delos Haring, PA.   We discussed all aspects of the encounter. I agree with the assessment and plan as stated above.  1. NSTEMI:  Pt presents 2 days after day long chest pain, tightness, radiation up to her neck. Also developed a nose bleed which actually brought her to the hospital Now is found to  have + troponins  She has been seen by Dr. Erik Obey and her nose has been cauterized.  Will plan on doing a cath tomorrow . We have discussed the risks, benefits , and options  She understands and agrees to proceed   2. Atrial fib:   Stable,,  eliquis is on hold for nose bleed and for cath tomorrow   3. Nose bleed:  Appreciated the assistance of Dr. Erik Obey.     I have spent a total of 40 minutes with patient reviewing hospital  notes , telemetry, EKGs, labs and examining patient as well as establishing an assessment and plan that was discussed with the patient. > 50% of time was spent in direct patient care.    Thayer Headings, Brooke Bonito., MD, [facility] 01/12/2017, 1:51 PM [address],  [place] Pager [phone]

## 2017-01-10 NOTE — ED Triage Notes (Signed)
Pt presents with a nosebleed from the right nare  Pt states it started about 63 tonight  Daughter states they got it to stop once but it started again about an hour ago  Pt is on blood thinners  Daughter states this is the third day this has happened  Daughter also states pt was c/o chest pain on Sunday but is not c/o any tonight  Pt has atrial fib

## 2017-01-10 NOTE — ED Notes (Signed)
Pt laying in bed with family at bedside currently awaiting room assignment at [facility] for Stepdown bed. No acute distress at this time. No bleeding from either nare. Pt resting with eyes closed equal chest rise and fall.

## 2017-01-10 NOTE — ED Notes (Signed)
Dr. Leonides Schanz and Kallie Locks, primary RN made aware of critical troponin 0.84

## 2017-01-10 NOTE — ED Provider Notes (Addendum)
TIME SEEN: 4:24 AM  CHIEF COMPLAINT: Nosebleed, chest pain  HPI: Patient is an 83 year old female with history of hypertension, hypothyroidism, atrial fibrillation on Eliquis who presents to the emergency department with complaints of a nosebleed that started intermittently on Sunday and became worse tonight around 12:30 PM. She denies history of recurrent nosebleeds. No recent sinus surgery. No recent trauma to the face or nose. She has been applying pressure to the nose without much relief.  You also reports that over the past several days patient has intermittently complained of diffuse anterior chest tightness with radiation into her left jaw, associated soreness of breath, nausea and vomiting. No diaphoresis or dizziness. No history of previous cardiac catheterization. She does not currently have a cardiologist. No recent stress test. Patient not having chest pain currently.  ROS: See HPI Constitutional: no fever  Eyes: no drainage  ENT: no runny nose   Cardiovascular:   chest pain  Resp:  SOB  GI: no vomiting GU: no dysuria Integumentary: no rash  Allergy: no hives  Musculoskeletal: no leg swelling  Neurological: no slurred speech ROS otherwise negative  PAST MEDICAL HISTORY/PAST SURGICAL HISTORY:  Past Medical History:  Diagnosis Date  . Acid reflux   . Arthritis   . Atrial fibrillation ([place])   . Hypertension   . Hypothyroidism   . Irregular heart rhythm   . Thyroid disease     MEDICATIONS:  Prior to Admission medications   Medication Sig Start Date End Date Taking? Authorizing Provider  apixaban (ELIQUIS) 5 MG TABS tablet Take 1 tablet (5 mg total) by mouth 2 (two) times daily. 09/04/15   Arta Silence, MD  digoxin (LANOXIN) 0.125 MG tablet Take 0.125 mg by mouth every morning.    Historical Provider, MD  HYDROcodone-acetaminophen (NORCO/VICODIN) 5-325 MG tablet Take 1 tablet by mouth at bedtime as needed. Pain    Historical Provider, MD  Liniments (SALONPAS PAIN  RELIEF PATCH EX) Apply 1 patch topically daily as needed. For muscle pain    Historical Provider, MD  nebivolol (BYSTOLIC) 5 MG tablet Take 5 mg by mouth every morning.    Historical Provider, MD  omeprazole (PRILOSEC) 40 MG capsule Take 40 mg by mouth every evening.     Historical Provider, MD  SYNTHROID 100 MCG tablet Take 100 mcg by mouth daily before breakfast. 06/26/15   Historical Provider, MD    ALLERGIES:  Allergies  Allergen Reactions  . Demerol [Meperidine] Anaphylaxis    Reaction: "Heart stopped"    SOCIAL HISTORY:  Social History  Substance Use Topics  . Smoking status: Never Smoker  . Smokeless tobacco: Never Used  . Alcohol use No    FAMILY HISTORY: No family history on file.  EXAM: BP (!) 130/101 (BP Location: Left Arm)   Pulse 79   Resp 20   SpO2 99%  CONSTITUTIONAL: Alert and oriented and responds appropriately to questions. Well-appearing; well-nourished, Elderly HEAD: Normocephalic EYES: Conjunctivae clear, pupils appear equal, EOMI ENT: normal nose; moist mucous membranes, blood coming out of the right nostril and into the posterior oropharynx NECK: Supple, no meningismus, no nuchal rigidity, no LAD  CARD: Irregularly irregular; S1 and S2 appreciated; no murmurs, no clicks, no rubs, no gallops RESP: Normal chest excursion without splinting or tachypnea; breath sounds clear and equal bilaterally; no wheezes, no rhonchi, no rales, no hypoxia or respiratory distress, speaking full sentences ABD/GI: Normal bowel sounds; non-distended; soft, non-tender, no rebound, no guarding, no peritoneal signs, no hepatosplenomegaly BACK:  The back appears  normal and is non-tender to palpation, there is no CVA tenderness EXT: Normal ROM in all joints; non-tender to palpation; no edema; normal capillary refill; no cyanosis, no calf tenderness or swelling    SKIN: Normal color for age and race; warm; no rash NEURO: Moves all extremities equally PSYCH: The patient's mood and  manner are appropriate. Grooming and personal hygiene are appropriate.  MEDICAL DECISION MAKING: Patient here with nosebleed. We will spray Afrin into this nostril and have her hold pressure for 30 minutes. Also had episodes of chest pain over the past several days. We'll obtain cardiac labs, chest x-ray. EKG shows atrial fibrillation with no new ischemic abnormality. Doubt PE, dissection given patient symptom free at this time.  ED PROGRESS: Nosebleed has stopped after Afrin and direct pressure. No active bleeding, septal hematoma.  Patient's labs show positive troponin. Chest x-ray clear. Will give dose of aspirin but hold heparin at this time given nosebleed just stopped. We'll discuss with cardiology on call for admission for NSTEMI.  5:30 AM  D/w Dr. Aundra Dubin with cardiology who agrees to accept the patient in transfer to [facility] to a stepdown bed. He is also requested that I updated the 6 AM cardiology physician as well. Patient and family updated with this pain for transport. I will place admission orders per cardiology request.   6:57 AM  D/w Dr. Radford Pax with cardiology and updated her on patient's plan. I have discussed with her at this time that I am still holding heparin given recent nosebleed. No current bleeding at this time. No chest pain still. Patient hemodynamically stable. Awaiting transport to [facility].   EKG Interpretation  Date/Time:  Tuesday January 10 2017 04:27:44 EDT Ventricular Rate:  87 PR Interval:    QRS Duration: 81 QT Interval:  379 QTC Calculation: 456 R Axis:   80 Text Interpretation:  Atrial fibrillation Nonspecific T abnormalities, lateral leads No significant change since last tracing other than rate is slower Confirmed by Garen Woolbright,  DO, Lior Hoen ([id number]) on 01/10/2017 5:00:53 AM        .Epistaxis Management Date/Time: 01/10/2017 6:42 AM Performed by: Nyra Jabs Authorized by: Nyra Jabs   Consent:    Consent obtained:   Verbal   Consent given by:  Patient   Risks discussed:  Bleeding, infection, nasal injury and pain   Alternatives discussed:  No treatment Anesthesia (see MAR for exact dosages):    Anesthesia method:  None Procedure details:    Treatment site:  Posterior   Treatment method:  Afrin and direct pressure; posterior Rhino Rocket with a double balloon placed   Treatment complexity:  Limited   Treatment episode: recurring   Post-procedure details:    Assessment:  Bleeding recurring with Afrin and direct pressure until posterior Rhino Rocket placed     CRITICAL CARE Performed by: Nyra Jabs   Total critical care time: 45 minutes  Critical care time was exclusive of separately billable procedures and treating other patients.  Critical care was necessary to treat or prevent imminent or life-threatening deterioration.  Critical care was time spent personally by me on the following activities: development of treatment plan with patient and/or surrogate as well as nursing, discussions with consultants, evaluation of patient's response to treatment, examination of patient, obtaining history from patient or surrogate, ordering and performing treatments and interventions, ordering and review of laboratory studies, ordering and review of radiographic studies, pulse oximetry and re-evaluation of patient's condition.    Delice Bison  Olie Scaffidi, DO 01/10/17 [place], DO 01/10/17 [id number]

## 2017-01-10 NOTE — ED Notes (Signed)
Verbal order for Afrin due to nose bleed. Dr.Ward at bedside. Active bleeding from right nare.

## 2017-01-10 NOTE — Progress Notes (Signed)
Paged Dr. Erik Obey and spoke with his assistant Amy  To let them know patient had arrived. Have been given orders for supplies to have on hand for examination. MD will see patient later this morning.

## 2017-01-11 ENCOUNTER — Inpatient Hospital Stay (HOSPITAL_COMMUNITY): Payer: Medicare Other

## 2017-01-11 ENCOUNTER — Encounter (HOSPITAL_COMMUNITY)
Admission: EM | Disposition: A | Discharge status: Home / Self Care | Payer: Self-pay | Source: Home / Self Care | Attending: Cardiovascular Disease

## 2017-01-11 DIAGNOSIS — R040 Epistaxis: Secondary | ICD-10-CM

## 2017-01-11 DIAGNOSIS — R079 Chest pain, unspecified: Secondary | ICD-10-CM

## 2017-01-11 DIAGNOSIS — I7121 Aneurysm of the ascending aorta, without rupture: Secondary | ICD-10-CM

## 2017-01-11 DIAGNOSIS — I712 Thoracic aortic aneurysm, without rupture: Secondary | ICD-10-CM | POA: Diagnosis present

## 2017-01-11 DIAGNOSIS — I2510 Atherosclerotic heart disease of native coronary artery without angina pectoris: Secondary | ICD-10-CM

## 2017-01-11 HISTORY — DX: Thoracic aortic aneurysm, without rupture: I71.2

## 2017-01-11 HISTORY — PX: LEFT HEART CATH AND CORONARY ANGIOGRAPHY: CATH118249

## 2017-01-11 HISTORY — DX: Aneurysm of the ascending aorta, without rupture: I71.21

## 2017-01-11 LAB — CBC
HCT: 31.2 % — ABNORMAL LOW (ref 36.0–46.0)
Hemoglobin: 10.4 g/dL — ABNORMAL LOW (ref 12.0–15.0)
MCH: 32.6 pg (ref 26.0–34.0)
MCHC: 33.3 g/dL (ref 30.0–36.0)
MCV: 97.8 fL (ref 78.0–100.0)
Platelets: 210 10*3/uL (ref 150–400)
RBC: 3.19 MIL/uL — ABNORMAL LOW (ref 3.87–5.11)
RDW: 13.1 % (ref 11.5–15.5)
WBC: 11.9 10*3/uL — ABNORMAL HIGH (ref 4.0–10.5)

## 2017-01-11 LAB — BASIC METABOLIC PANEL
Anion gap: 9 (ref 5–15)
BUN: 35 mg/dL — AB (ref 6–20)
CALCIUM: 9 mg/dL (ref 8.9–10.3)
CO2: 28 mmol/L (ref 22–32)
CREATININE: 1.22 mg/dL — AB (ref 0.44–1.00)
Chloride: 94 mmol/L — ABNORMAL LOW (ref 101–111)
GFR calc Af Amer: 47 mL/min — ABNORMAL LOW (ref >60)
GFR, EST NON AFRICAN AMERICAN: 41 mL/min — AB (ref >60)
GLUCOSE: 119 mg/dL — AB (ref 65–99)
POTASSIUM: 3.9 mmol/L (ref 3.5–5.1)
SODIUM: 131 mmol/L — AB (ref 135–145)

## 2017-01-11 LAB — LIPID PANEL
Cholesterol: 168 mg/dL (ref 0–200)
HDL: 41 mg/dL (ref >40)
LDL Cholesterol: 105 mg/dL — ABNORMAL HIGH (ref 0–99)
Total CHOL/HDL Ratio: 4.1 RATIO
Triglycerides: 111 mg/dL (ref <150)
VLDL: 22 mg/dL (ref 0–40)

## 2017-01-11 LAB — ECHOCARDIOGRAM COMPLETE
Height: 63 in
Weight: 1985.9 oz

## 2017-01-11 SURGERY — LEFT HEART CATH AND CORONARY ANGIOGRAPHY
Anesthesia: LOCAL

## 2017-01-11 MED ORDER — HEPARIN (PORCINE) IN NACL 2-0.9 UNIT/ML-% IJ SOLN
INTRAMUSCULAR | Status: AC
  Filled 2017-01-11: qty 500

## 2017-01-11 MED ORDER — VERAPAMIL HCL 2.5 MG/ML IV SOLN
INTRAVENOUS | Status: DC | PRN
  Administered 2017-01-11: 10 mL via INTRA_ARTERIAL

## 2017-01-11 MED ORDER — MIDAZOLAM HCL 2 MG/2ML IJ SOLN
INTRAMUSCULAR | Status: AC
  Filled 2017-01-11: qty 2

## 2017-01-11 MED ORDER — LIDOCAINE HCL (PF) 1 % IJ SOLN
INTRAMUSCULAR | Status: DC | PRN
  Administered 2017-01-11: 2 mL

## 2017-01-11 MED ORDER — IOPAMIDOL (ISOVUE-370) INJECTION 76%
INTRAVENOUS | Status: DC | PRN
  Administered 2017-01-11: 80 mL via INTRA_ARTERIAL

## 2017-01-11 MED ORDER — SODIUM CHLORIDE 0.9% FLUSH
3.0000 mL | Freq: Two times a day (BID) | INTRAVENOUS | Status: DC
  Administered 2017-01-11: 3 mL via INTRAVENOUS

## 2017-01-11 MED ORDER — SODIUM CHLORIDE 0.9 % IV SOLN: 250.0000 mL | INTRAVENOUS | Status: DC | PRN

## 2017-01-11 MED ORDER — SODIUM CHLORIDE 0.9 % IV SOLN
INTRAVENOUS | Status: AC
  Administered 2017-01-11: 75 mL/h via INTRAVENOUS

## 2017-01-11 MED ORDER — SODIUM CHLORIDE 0.9% FLUSH: 3.0000 mL | INTRAVENOUS | Status: DC | PRN

## 2017-01-11 MED ORDER — MIDAZOLAM HCL 2 MG/2ML IJ SOLN
INTRAMUSCULAR | Status: DC | PRN
  Administered 2017-01-11: 0.5 mg via INTRAVENOUS

## 2017-01-11 MED ORDER — VERAPAMIL HCL 2.5 MG/ML IV SOLN
INTRAVENOUS | Status: AC
  Filled 2017-01-11: qty 2

## 2017-01-11 MED ORDER — LIDOCAINE HCL (PF) 1 % IJ SOLN
INTRAMUSCULAR | Status: AC
  Filled 2017-01-11: qty 30

## 2017-01-11 MED ORDER — FENTANYL CITRATE (PF) 100 MCG/2ML IJ SOLN
INTRAMUSCULAR | Status: AC
  Filled 2017-01-11: qty 2

## 2017-01-11 MED ORDER — HEPARIN (PORCINE) IN NACL 2-0.9 UNIT/ML-% IJ SOLN
INTRAMUSCULAR | Status: DC | PRN
  Administered 2017-01-11: 1000 mL

## 2017-01-11 MED ORDER — FENTANYL CITRATE (PF) 100 MCG/2ML IJ SOLN
INTRAMUSCULAR | Status: DC | PRN
  Administered 2017-01-11: 12.5 ug via INTRAVENOUS

## 2017-01-11 MED ORDER — HEPARIN SODIUM (PORCINE) 1000 UNIT/ML IJ SOLN
INTRAMUSCULAR | Status: DC | PRN
  Administered 2017-01-11: 3500 [IU] via INTRAVENOUS

## 2017-01-11 SURGICAL SUPPLY — 9 items
CATH OPTITORQUE TIG 4.0 5F (CATHETERS) ×2 IMPLANT
DEVICE RAD COMP TR BAND LRG (VASCULAR PRODUCTS) ×2 IMPLANT
GLIDESHEATH SLEND A-KIT 6F 22G (SHEATH) ×2 IMPLANT
GUIDEWIRE INQWIRE 1.5J.035X260 (WIRE) ×1 IMPLANT
INQWIRE 1.5J .035X260CM (WIRE) ×2
KIT HEART LEFT (KITS) ×2 IMPLANT
PACK CARDIAC CATHETERIZATION (CUSTOM PROCEDURE TRAY) ×2 IMPLANT
TRANSDUCER W/STOPCOCK (MISCELLANEOUS) ×2 IMPLANT
TUBING CIL FLEX 10 FLL-RA (TUBING) ×2 IMPLANT

## 2017-01-11 NOTE — H&P (View-Only) (Signed)
Paged Dr. Erik Obey and spoke with his assistant Amy  To let them know patient had arrived. Have been given orders for supplies to have on hand for examination. MD will see patient later this morning.

## 2017-01-11 NOTE — Progress Notes (Signed)
  Echocardiogram 2D Echocardiogram has been performed.  Lydia Jordan 01/11/2017, 9:51 AM

## 2017-01-11 NOTE — Progress Notes (Signed)
Progress Note  Patient Name: Lydia Jordan Date of Encounter: 01/11/2017  Primary Cardiologist: new , [place]  Subjective    83 y.o. female with a history of atrial fibrillation (diagnosed approx 2007) on Eliquis, hypertension, hypothyroidism, acid reflux, chronic pain. She presented to the ER last night (4/9) for epistaxis  Reported some dyspnea and CP on Sunday .  Troponins were found to be elevated.  Had some chest tightness this am, releived with SL NTG .  Troponin levels appear to have peaked and are trending down     Inpatient Medications    Scheduled Meds: . aspirin EC  81 mg Oral Daily  . bacitracin  1 application Topical BID  . diclofenac  50 mg Oral BID  . digoxin  0.125 mg Oral Daily  . DULoxetine  30 mg Oral QHS  . DULoxetine  60 mg Oral Daily  . heparin  5,000 Units Subcutaneous Q8H  . HYDROcodone-acetaminophen  1 tablet Oral BID  . levothyroxine  100 mcg Oral QAC breakfast  . nebivolol  5 mg Oral Daily  . pantoprazole  40 mg Oral Daily  . sodium chloride flush  3 mL Intravenous Q12H   Continuous Infusions: . sodium chloride     PRN Meds: sodium chloride, acetaminophen, lidocaine, lidocaine, lidocaine-EPINEPHrine, nitroGLYCERIN, ondansetron (ZOFRAN) IV, oxymetazoline, silver nitrate applicators, sodium chloride, sodium chloride flush, TRIPLE ANTIBIOTIC   Vital Signs    Vitals:   01/11/17 0605 01/11/17 0645 01/11/17 0655 01/11/17 0735  BP: 118/68 125/77 91/73 108/77  Pulse:  68 67 75  Resp:   16 16  Temp:    97.8 F (36.6 C)  TempSrc:    Oral  SpO2:  100% 99% 99%  Weight:      Height:        Intake/Output Summary (Last 24 hours) at 01/11/17 0802 Last data filed at 01/10/17 2303  Gross per 24 hour  Intake              480 ml  Output                1 ml  Net              47 9 ml   Filed Weights   01/10/17 0429 01/10/17 0938 01/11/17 0500  Weight: 124 lb (56.2 kg) 124 lb 9 oz (56.5 kg) 124 lb 1.9 oz (56.3 kg)    Telemetry    Atrial fib,  with controlled V rate  - Personally Reviewed  ECG    A-fib , controlled vent response  - Personally Reviewed  Physical Exam   GEN: No acute distress.  Appears comfortable  Neck: No JVD Cardiac: Irreg. Irreg. , no murmurs,  .  Respiratory: Clear to auscultation bilaterally. GI: Soft, nontender, non-distended  MS: No edema; No deformity.  Good radial pulses.  Neuro:  Nonfocal  Psych: Normal affect   Labs    Chemistry Recent Labs Lab 01/10/17 0424 01/10/17 1825 01/11/17 0237  NA 129*  --  131*  K 4.3  --  3.9  CL 93*  --  94*  CO2 26  --  28  GLUCOSE 150*  --  119*  BUN 27*  --  35*  CREATININE 1.14* 1.34* 1.22*  CALCIUM 9.7  --  9.0  GFRNONAA 44* 36* 41*  GFRAA 51* 42* 47*  ANIONGAP 10  --  9     Hematology Recent Labs Lab 01/10/17 0424 01/10/17 1825 01/11/17 0237  WBC 16.8* 14.1*  11.9*  RBC 3.74* 3.53* 3.19*  HGB 12.7 11.6* 10.4*  HCT 36.8 34.5* 31.2*  MCV 98.4 97.7 97.8  MCH 34.0 32.9 32.6  MCHC 34.5 33.6 33.3  RDW 13.2 13.1 13.1  PLT 285 264 210    Cardiac Enzymes Recent Labs Lab 01/10/17 0424 01/10/17 1130 01/10/17 1825 01/10/17 2253  TROPONINI 0.84* 0.63* 0.69* 0.54*    Recent Labs Lab 01/10/17 0444  TROPIPOC 0.48*     BNPNo results for input(s): BNP, PROBNP in the last 168 hours.   DDimer No results for input(s): DDIMER in the last 168 hours.   Radiology    Dg Chest Portable 1 View  Result Date: 01/10/2017 CLINICAL DATA:  Nosebleed.  Chest pain shortness of breath. EXAM: PORTABLE CHEST 1 VIEW COMPARISON:  None. FINDINGS: Cardiomediastinal silhouette is mildly enlarged with calcification within the aortic arch. There is no pulmonary edema or focal airspace consolidation. No pneumothorax or pleural effusion. There is moderate bilateral shoulder osteoarthrosis. IMPRESSION: 1. No active cardiopulmonary disease. 2. Mild cardiomegaly and aortic atherosclerosis. Electronically Signed   By: Ulyses Jarred M.D.   On: 01/10/2017 05:12     Cardiac Studies      Patient Profile     83 y.o. female with appearant NSTEMI  Assessment & Plan    1. NSTEMI:   Plan is for cath today  Had more CP this am , relieved with SL NTG. Eliquis has been held  2. Atrial fib:  Rate is well controlled.   Will restart Eliquis as soon as we are through with coronary work up / procedures.   3. Nose bleed:  No recurrent nose since silver nitrate cautery   Signed, Mertie Moores, MD  01/11/2017, 8:02 AM

## 2017-01-11 NOTE — Care Management Note (Addendum)
Case Management Note  Patient Details  Name: Lydia Jordan MRN: [id number] Date of Birth: 09-19-1934  Subjective/Objective:   From home presents with NSTEMI and afib rvr and recent nosebleed.  Plan for left heart cath and angiography with possible pci.  Per heart cath procedure states will monitor patient tonight and restart eliquis and dc in am.  NCM will cont to follow for dc needs.                 Action/Plan:   Expected Discharge Date:                  Expected Discharge Plan:  [facility]  In-House Referral:     Discharge planning Services  CM Consult  Post Acute Care Choice:    Choice offered to:     DME Arranged:    DME Agency:     HH Arranged:    [place] Agency:     Status of Service:  In process, will continue to follow  If discussed at Long Length of Stay Meetings, dates discussed:    Additional Comments:  Zenon Mayo, RN 01/11/2017, 2:43 PM

## 2017-01-11 NOTE — Progress Notes (Signed)
TR BAND REMOVAL  LOCATION:    Right radial  DEFLATED PER PROTOCOL:    Yes.    TIME BAND OFF / DRESSING APPLIED:    1545p  A clean dry dressing applied.   SITE UPON ARRIVAL:    Level 0  SITE AFTER BAND REMOVAL:    Level 0  CIRCULATION SENSATION AND MOVEMENT:    Within Normal Limits   Yes.    COMMENTS:   Skin tone normal, and pt able to move ext  without any freely and discomfort

## 2017-01-11 NOTE — Interval H&P Note (Signed)
History and Physical Interval Note:  01/11/2017 1:08 PM  Lydia Jordan  has presented today for surgery, with the diagnosis of n stemi in the setting of A. fib RVR and recent nosebleed. The various methods of treatment have been discussed with the patient and family. After consideration of risks, benefits and other options for treatment, the patient has consented to  Procedure(s): Left Heart Cath and Coronary Angiography (N/A)  With possible Percutaneous Coronary Intervention as a surgical intervention .  The patient's history has been reviewed, patient examined, no change in status, stable for surgery.  I have reviewed the patient's chart and labs.  Questions were answered to the patient's satisfaction.    Cath Lab Visit (complete for each Cath Lab visit)  Clinical Evaluation Leading to the Procedure:   ACS: Yes.    Non-ACS:    Anginal Classification: CCS III  Anti-ischemic medical therapy: Minimal Therapy (1 class of medications)  Non-Invasive Test Results: No non-invasive testing performed  Prior CABG: No previous CABG   Glenetta Hew

## 2017-01-12 ENCOUNTER — Inpatient Hospital Stay (HOSPITAL_COMMUNITY): Payer: Medicare Other

## 2017-01-12 ENCOUNTER — Encounter (HOSPITAL_COMMUNITY): Payer: Self-pay | Admitting: Cardiology

## 2017-01-12 DIAGNOSIS — R040 Epistaxis: Secondary | ICD-10-CM

## 2017-01-12 DIAGNOSIS — I10 Essential (primary) hypertension: Secondary | ICD-10-CM

## 2017-01-12 DIAGNOSIS — I2510 Atherosclerotic heart disease of native coronary artery without angina pectoris: Secondary | ICD-10-CM

## 2017-01-12 DIAGNOSIS — I4811 Longstanding persistent atrial fibrillation: Secondary | ICD-10-CM

## 2017-01-12 DIAGNOSIS — M79609 Pain in unspecified limb: Secondary | ICD-10-CM

## 2017-01-12 DIAGNOSIS — Z9889 Other specified postprocedural states: Secondary | ICD-10-CM

## 2017-01-12 MED ORDER — ASPIRIN 81 MG PO TBEC: 81.0000 mg | DELAYED_RELEASE_TABLET | Freq: Every day | ORAL | 6 refills | Status: DC

## 2017-01-12 MED ORDER — ATORVASTATIN CALCIUM 20 MG PO TABS: 20.0000 mg | ORAL_TABLET | Freq: Every day | ORAL | 6 refills | Status: AC

## 2017-01-12 MED ORDER — NITROGLYCERIN 0.4 MG SL SUBL: 0.4000 mg | SUBLINGUAL_TABLET | SUBLINGUAL | 2 refills | Status: DC | PRN

## 2017-01-12 NOTE — Discharge Summary (Signed)
Discharge Summary    Patient ID: KENDRIA HALBERG,  MRN: [id number], DOB/AGE: 1934/02/11 83 y.o.  Admit date: 01/10/2017 Discharge date: 01/12/2017  Primary Care Provider: Irven Shelling Primary Cardiologist: New, Dr. Acie Fredrickson  Discharge Diagnoses    Principal Problem:   Atrial fibrillation, new onset [facility]) Active Problems:   NSTEMI (non-ST elevated myocardial infarction) ([place])   S/P cardiac cath: (2018) a. mild to moderate disease but no flow limiting lesions with perserved EF. b. large-caliber almost ectatic vessels for patients age.   Hypertension   CAD (coronary artery disease): mild to moderate   Epistaxis: (2018) cauterized by Dr. Erik Obey Allergies Allergies  Allergen Reactions  . Demerol [Meperidine] Anaphylaxis    Reaction: "Heart stopped"     History of Present Illness     Lydia Jordan is a 83 y.o. female with a history of atrial fibrillation (diagnosed approx 2007) on Eliquis, hypertension, hypothyroidism, acid reflux, chronic pain. She presented to the ER  (4/9) for epistaxis. She denies having any chest pains in the past but she does suffer from GERD and chronic neck pain. She had an echo done in 2007 after diagnosis for atrial fibrillation which revealed normal left ventricular systolic function. LVEF 55%, no wall motion abnormalities. Her primary care doctor has been managing her a.fib with Digoxin, Eliquis and Bystolic. She denies having any sensation of palpitations.   On Sunday she had severe intermittent episodes of diffuse chest pain that radiated into her left jaw. She felt fatigued and short of breath. She also had associated nausea, vomiting and epistaxis. She does not remember the day well because she felt so poorly. She described the pain as ongoing all day. She has not had any further chest pain since Sunday but continued to have fatigue and nosebleeds.   Hospital Course     Consultants: Cardiology  Dr. Erik Obey cauterized nosebleed which  did not continue to be an issue this admission.  She reported some dyspnea and CP on Sunday. Troponins were found to be elevated.  Had some chest tightness prior to her cath which releived with SL NTG . Troponin levels  peaked and are trending down. Had a cardiac catheterization done yesterday which showed no culprit lesions to explain elevated  Trop and only mild-mod CAD. Plan is to restart Eliquis today and start medical therapy for CAD. The patients daughter was concerned that pt missed obtaining lower extremity vascular study for "cold, blue, and painful feet". The test was scheduled to be done within the past few days but was missed because of admission.  Vascular study showed ABI's were within normal limits at rest.   She will be referred back to PCP for further work-up. She will have a follow-up appointment in 2 weeks with cardiology office.he patient has had an uncomplicated hospital course and is recovering well. At follow-up appointment consider if patient is still in afib and if she meets criteria for elective cardioversion after anticoags for 4 weeks The right radial catheter site is stable  She has been seen by Dr. Acie Fredrickson today and deemed ready for discharge home. All follow-up appointments have been scheduled.  Discharge medications are listed below.   _____________  Discharge Vitals Blood pressure 105/62, pulse (!) 58, temperature 97.8 F (36.6 C), temperature source Oral, resp. rate 14, height 5\' 3"  (1.6 m), weight 125 lb 9.6 oz (57 kg), SpO2 100 %.  Filed Weights   01/10/17 0938 01/11/17 0500 01/12/17 0502  Weight: 124 lb 9 oz (56.5  kg) 124 lb 1.9 oz (56.3 kg) 125 lb 9.6 oz (57 kg)    Labs & Radiologic Studies     CBC  Recent Labs  01/10/17 0424 01/10/17 1825 01/11/17 0237  WBC 16.8* 14.1* 11.9*  NEUTROABS 12.9* 10.1*  --   HGB 12.7 11.6* 10.4*  HCT 36.8 34.5* 31.2*  MCV 98.4 97.7 97.8  PLT 285 264 295   Basic Metabolic Panel  Recent Labs  01/10/17 0424  01/10/17 1825 01/11/17 0237  NA 129*  --  131*  K 4.3  --  3.9  CL 93*  --  94*  CO2 26  --  28  GLUCOSE 150*  --  119*  BUN 27*  --  35*  CREATININE 1.14* 1.34* 1.22*  CALCIUM 9.7  --  9.0   Cardiac Enzymes  Recent Labs  01/10/17 1130 01/10/17 1825 01/10/17 2253  TROPONINI 0.63* 0.69* 0.54*   Fasting Lipid Panel  Recent Labs  01/11/17 0237  CHOL 168  HDL 41  LDLCALC 105*  TRIG 111  CHOLHDL 4.1    Dg Chest Portable 1 View Result Date: 01/10/2017 1. No active cardiopulmonary disease. 2. Mild cardiomegaly and aortic atherosclerosis.     Diagnostic Studies/Procedures    Transthoracic Echocardiography 01/22/2017 Study Conclusions - Left ventricle: The cavity size was normal. There was moderate focal basal hypertrophy of the septum. Systolic function was normal. The estimated ejection fraction was in the range of 60% to 65%. Wall motion was normal; there were no regional wall motion abnormalities. - Aortic valve: Transvalvular velocity was within the normal range. There was no stenosis. There was mild regurgitation. Regurgitation pressure half-time: 687 ms. - Aorta: Ascending aorta diameter: 50 mm (ED). - Aortic root: The aortic root was severely dilated. - Mitral valve: Transvalvular velocity was within the normal range. There was no evidence for stenosis. There was trivial regurgitation. - Left atrium: The atrium was severely dilated. - Right ventricle: The cavity size was normal. Wall thickness was normal. Systolic function was normal. - Right atrium: The atrium was severely dilated. - Tricuspid valve: There was mild regurgitation, with multiple jets. - Pulmonary arteries: Systolic pressure was within the normal range. PA peak pressure: 32 mm Hg (S).  Left Heart Cath and Coronary Angiography 01/11/2017  Ost RCA lesion, 50 %stenosed.  Mid LAD lesion, 50 %stenosed.  The left ventricular systolic function is normal. The left  ventricular ejection fraction is 55-65% by visual estimate.  LV end diastolic pressure is normal.  Intragraft the mild to moderate disease but no flow-limiting lesions with preserved EF and normal LVEDP. Large-caliber almost ectatic vessels for patient's age.  No culprit lesion to explain the patient's positive troponins. Suspect could be related to A. fib RVR or stress.  Plan: Monitor tonight and restart ELIQUIS tomorrow. Medical management for mild to moderate CAD  _____________    Disposition   Pt is being discharged home today in good condition.  Follow-up Plans & Appointments    Follow-up Information    [place], [place]. Go on 01/26/2017.   Specialty:  Cardiology Why:  follow-up appointment is at 10:30 AM, pelase arrive 15 minutes early. Contact information: [address] [place] [address] [phone]          Discharge Instructions    Diet - low sodium heart healthy    Complete by:  As directed    Increase activity slowly    Complete by:  As directed    May shower / Bathe  Complete by:  As directed       Discharge Medications   Allergies as of 01/12/2017      Reactions   Demerol [meperidine] Anaphylaxis   Reaction: "Heart stopped"      Medication List    TAKE these medications   apixaban 5 MG Tabs tablet Commonly known as:  ELIQUIS Take 1 tablet (5 mg total) by mouth 2 (two) times daily.   aspirin 81 MG EC tablet Take 1 tablet (81 mg total) by mouth daily. Start taking on:  01/13/2017   atorvastatin 20 MG tablet Commonly known as:  LIPITOR Take 1 tablet (20 mg total) by mouth daily.   diclofenac 50 MG tablet Commonly known as:  CATAFLAM Take 50 mg by mouth 2 (two) times daily.   digoxin 0.125 MG tablet Commonly known as:  LANOXIN Take 0.125 mg by mouth every morning.   DULoxetine 30 MG capsule Commonly known as:  CYMBALTA Take 30 mg by mouth at bedtime.   DULoxetine 60 MG capsule Commonly known as:   CYMBALTA Take 60 mg by mouth daily.   HYDROcodone-acetaminophen 5-325 MG tablet Commonly known as:  NORCO/VICODIN Take 1 tablet by mouth 2 (two) times daily. Pain   nebivolol 5 MG tablet Commonly known as:  BYSTOLIC Take 5 mg by mouth every morning.   nitroGLYCERIN 0.4 MG SL tablet Commonly known as:  NITROSTAT Place 1 tablet (0.4 mg total) under the tongue every 5 (five) minutes x 3 doses as needed for chest pain.   omeprazole 40 MG capsule Commonly known as:  PRILOSEC Take 40 mg by mouth at bedtime.   SALONPAS PAIN RELIEF PATCH EX Apply 1 patch topically daily as needed. For muscle pain   SYNTHROID 100 MCG tablet Generic drug:  levothyroxine Take 100 mcg by mouth daily before breakfast.        Outstanding Labs/Studies   CBC and CMP   Duration of Discharge Encounter   Greater than 30 minutes including physician time.  Kristopher Glee PA-C 01/12/2017, 2:11 PM   Attending Note:   The patient was seen and examined.  Agree with assessment and plan as noted above.  Changes made to the above note as needed.  Patient seen and independently examined with Delos Haring, PA .   We discussed all aspects of the encounter. I agree with the assessment and plan as stated above.  See progress note from same day . Cath shows no critical CAD.   Likely had demand ischemia  2.   Atrial fib :   Restart Eliquis . Rate is well controlled.   3. Nose bleed:   Cauterized by Dr. Erik Obey.   No additional issues    I have spent a total of 40 minutes with patient reviewing hospital  notes , telemetry, EKGs, labs and examining patient as well as establishing an assessment and plan that was discussed with the patient. > 50% of time was spent in direct patient care.    Thayer Headings, Brooke Bonito., MD, [facility] 01/12/2017, 4:22 PM [address]  [place] Pager [phone]

## 2017-01-12 NOTE — Progress Notes (Signed)
Progress Note  Patient Name: Lydia Jordan Date of Encounter: 01/12/2017  Primary Cardiologist: new. Dr.Nahser  Subjective   83 y.o.femalewith a history of atrial fibrillation (diagnosed approx 2007) on Eliquis, hypertension, hypothyroidism, acid reflux, chronic pain. Shepresented to the ER last night (4/9)for epistaxis  Had a cardiac catheterization done yesterday which showed no culprit lesions to explain elevated  Trop and only mild-mod CAD. Plan is to restart Eliquis today and start medical therapy for CAD.  The patients daughter is concerned that she missed obtaining lower extremity vascular study for "cold, blue, and painful feet" that Dr. Claiborne Billings had ordered. The test was scheduled to be done within the past few days and she missed this because of admission. She would like this addressed before the patient goes home.  Inpatient Medications    Scheduled Meds: . aspirin EC  81 mg Oral Daily  . bacitracin  1 application Topical BID  . diclofenac  50 mg Oral BID  . digoxin  0.125 mg Oral Daily  . DULoxetine  30 mg Oral QHS  . DULoxetine  60 mg Oral Daily  . heparin  5,000 Units Subcutaneous Q8H  . HYDROcodone-acetaminophen  1 tablet Oral BID  . levothyroxine  100 mcg Oral QAC breakfast  . nebivolol  5 mg Oral Daily  . pantoprazole  40 mg Oral Daily  . sodium chloride flush  3 mL Intravenous Q12H  . sodium chloride flush  3 mL Intravenous Q12H   Continuous Infusions:  PRN Meds: sodium chloride, sodium chloride, acetaminophen, lidocaine, lidocaine, lidocaine-EPINEPHrine, nitroGLYCERIN, ondansetron (ZOFRAN) IV, oxymetazoline, silver nitrate applicators, sodium chloride, sodium chloride flush, sodium chloride flush, TRIPLE ANTIBIOTIC   Vital Signs    Vitals:   01/11/17 2100 01/11/17 2348 01/12/17 0502 01/12/17 0745  BP: (!) 144/61 112/71 (!) 122/54 (!) 145/79  Pulse: 79 83 78   Resp: 18 15 14    Temp: 97.6 F (36.4 C) 98.5 F (36.9 C) 97.8 F (36.6 C) 98.1 F  (36.7 C)  TempSrc: Oral   Oral  SpO2: 100% 99% 98% 98%  Weight:   125 lb 9.6 oz (57 kg)   Height:        Intake/Output Summary (Last 24 hours) at 01/12/17 0806 Last data filed at 01/12/17 0511  Gross per 24 hour  Intake              713 ml  Output             1050 ml  Net             -337 ml   Filed Weights   01/10/17 0938 01/11/17 0500 01/12/17 0502  Weight: 124 lb 9 oz (56.5 kg) 124 lb 1.9 oz (56.3 kg) 125 lb 9.6 oz (57 kg)    Telemetry    Rate controlled atrial fibrillation- Personally Reviewed  ECG    HR 76, atrial fibrillation - Personally Reviewed  Physical Exam   GEN: No acute distress.   Neck: No JVD Cardiac: irregular irregular, no murmurs, rubs, or gallops.  Respiratory: Clear to auscultation bilaterally. GI: Soft, nontender, non-distended  MS: No edema; No deformity. Neuro:  Nonfocal  Psych: Normal affect   Labs    Chemistry  Recent Labs Lab 01/10/17 0424 01/10/17 1825 01/11/17 0237  NA 129*  --  131*  K 4.3  --  3.9  CL 93*  --  94*  CO2 26  --  28  GLUCOSE 150*  --  119*  BUN 27*  --  35*  CREATININE 1.14* 1.34* 1.22*  CALCIUM 9.7  --  9.0  GFRNONAA 44* 36* 41*  GFRAA 51* 42* 47*  ANIONGAP 10  --  9     Hematology  Recent Labs Lab 01/10/17 0424 01/10/17 1825 01/11/17 0237  WBC 16.8* 14.1* 11.9*  RBC 3.74* 3.53* 3.19*  HGB 12.7 11.6* 10.4*  HCT 36.8 34.5* 31.2*  MCV 98.4 97.7 97.8  MCH 34.0 32.9 32.6  MCHC 34.5 33.6 33.3  RDW 13.2 13.1 13.1  PLT 285 264 210    Cardiac Enzymes  Recent Labs Lab 01/10/17 0424 01/10/17 1130 01/10/17 1825 01/10/17 2253  TROPONINI 0.84* 0.63* 0.69* 0.54*     Recent Labs Lab 01/10/17 0444  TROPIPOC 0.48*     Radiology    No results found.  Cardiac Studies   Transthoracic Echocardiography 01/22/2017 Study Conclusions - Left ventricle: The cavity size was normal. There was moderate   focal basal hypertrophy of the septum. Systolic function was   normal. The estimated ejection  fraction was in the range of 60%   to 65%. Wall motion was normal; there were no regional wall   motion abnormalities. - Aortic valve: Transvalvular velocity was within the normal range.   There was no stenosis. There was mild regurgitation.   Regurgitation pressure half-time: 687 ms. - Aorta: Ascending aorta diameter: 50 mm (ED). - Aortic root: The aortic root was severely dilated. - Mitral valve: Transvalvular velocity was within the normal range.   There was no evidence for stenosis. There was trivial   regurgitation. - Left atrium: The atrium was severely dilated. - Right ventricle: The cavity size was normal. Wall thickness was   normal. Systolic function was normal. - Right atrium: The atrium was severely dilated. - Tricuspid valve: There was mild regurgitation, with multiple   jets. - Pulmonary arteries: Systolic pressure was within the normal   range. PA peak pressure: 32 mm Hg (S).  Left Heart Cath and Coronary Angiography 01/11/2017  Ost RCA lesion, 50 %stenosed.  Mid LAD lesion, 50 %stenosed.  The left ventricular systolic function is normal. The left ventricular ejection fraction is 55-65% by visual estimate.  LV end diastolic pressure is normal.   Intragraft the mild to moderate disease but no flow-limiting lesions with preserved EF and normal LVEDP. Large-caliber almost ectatic vessels for patient's age.  No culprit lesion to explain the patient's positive troponins. Suspect could be related to A. fib RVR or stress.  Plan: Monitor tonight and restart ELIQUIS tomorrow. Medical management for mild to moderate CAD  Patient Profile        84 y.o. female with appearant NSTEMI  Assessment & Plan    1. NSTEMI:   Cardiac catheterization revealed no culprit lesions to explain patients positive troponins with preserved EF. Suspect that its related to A.fib or stress.   2. NOB:SJGG to moderate CAD, medical management with aspirin 81 mg daily and statin.  LDL  105  3. Atrial fib:  Rate is well controlled.  Restart Eliquis today. Consider converting in 3-4 weeks, to be discussed at follow-up appointment.  4. Nose bleed:  No recurrent nose since silver nitrate cautery    PLAN: to discuss with MD, start Eliquis today, start on a low dose statin (Lipitor 20 mg daily), cont aspirin 81 mg daily, follow-up with Dr. Dagmar Hait as needed, f/u with cardiology in 2 weeks, discuss possible outpatient cardioversion at f/u appt. Patient ready for discharge from a cardiac standpoint but daughter wants LE vascular  study done before discharge, will defer this decision to Dr. Acie Fredrickson.   Kristopher Glee, PA-C  01/12/2017, 8:06 AM    Attending Note:   The patient was seen and examined.  Agree with assessment and plan as noted above.  Changes made to the above note as needed.  Patient seen and independently examined with Delos Haring, PA .   We discussed all aspects of the encounter. I agree with the assessment and plan as stated above.  1. NSTEMI:   Cath reveals ectatic coronaries with moderate disease. She has an interesting collateral that fills a vessel to the ? Right atrium.   No obstructive disease  I suspect the troponin bump was demand ischemia  2. Atrial fib:  Restart Eliquis  3. Possible peripheral vascular disease:   I have called the vascular lab.  They will do the arterial duplex this am    Anticipate DC later today     I have spent a total of 40 minutes with patient reviewing hospital  notes , telemetry, EKGs, labs and examining patient as well as establishing an assessment and plan that was discussed with the patient. > 50% of time was spent in direct patient care.    Thayer Headings, Brooke Bonito., MD, [facility] 01/12/2017, 9:03 AM [address]  [place] Pager [phone]

## 2017-01-12 NOTE — Plan of Care (Signed)
Problem: Pain Managment: Goal: General experience of comfort will improve Outcome: Progressing Pt has chronic pain issues. Pt's pain is controlled with scheduled pain meds that have been prescribed PTA.   Problem: Cardiovascular: Goal: Vascular access site(s) Level 0-1 will be maintained Outcome: Progressing Right radial is a level "0", no complications noted.   Problem: Education: Goal: Understanding of CV disease, CV risk reduction, and recovery process will improve Outcome: Progressing Pt is aware of her cath results and the medical treatment that has been planned. Pt verbally agrees to understanding. All questions have been answered.

## 2017-01-12 NOTE — Discharge Instructions (Signed)
No driving for 5 days. No lifting over 5 lbs for 1 week. No sexual activity for 1 week. You may return to work on 5-7 days. Keep procedure site clean & dry. If you notice increased pain, swelling, bleeding or pus, call/return!  You may shower, but no soaking baths/hot tubs/pools for 1 week.     Atrial Fibrillation Atrial fibrillation is a type of heartbeat that is irregular or fast (rapid). If you have this condition, your heart keeps quivering in a weird (chaotic) way. This condition can make it so your heart cannot pump blood normally. Having this condition gives a person more risk for stroke, heart failure, and other heart problems. There are different types of atrial fibrillation. Talk with your doctor to learn about the type that you have. Follow these instructions at home:  Take over-the-counter and prescription medicines only as told by your doctor.  If your doctor prescribed a blood-thinning medicine, take it exactly as told. Taking too much of it can cause bleeding. If you do not take enough of it, you will not have the protection that you need against stroke and other problems.  Do not use any tobacco products. These include cigarettes, chewing tobacco, and e-cigarettes. If you need help quitting, ask your doctor.  If you have apnea (obstructive sleep apnea), manage it as told by your doctor.  Do not drink alcohol.  Do not drink beverages that have caffeine. These include coffee, soda, and tea.  Maintain a healthy weight. Do not use diet pills unless your doctor says they are safe for you. Diet pills may make heart problems worse.  Follow diet instructions as told by your doctor.  Exercise regularly as told by your doctor.  Keep all follow-up visits as told by your doctor. This is important. Contact a doctor if:  You notice a change in the speed, rhythm, or strength of your heartbeat.  You are taking a blood-thinning medicine and you notice more bruising.  You get tired  more easily when you move or exercise. Get help right away if:  You have pain in your chest or your belly (abdomen).  You have sweating or weakness.  You feel sick to your stomach (nauseous).  You notice blood in your throw up (vomit), poop (stool), or pee (urine).  You are short of breath.  You suddenly have swollen feet and ankles.  You feel dizzy.  Your suddenly get weak or numb in your face, arms, or legs, especially if it happens on one side of your body.  You have trouble talking, trouble understanding, or both.  Your face or your eyelid droops on one side. These symptoms may be an emergency. Do not wait to see if the symptoms will go away. Get medical help right away. Call your local emergency services (911 in the [place] Do not drive yourself to the hospital. This information is not intended to replace advice given to you by your health care provider. Make sure you discuss any questions you have with your health care provider. Document Released: 06/28/2008 Document Revised: 02/25/2016 Document Reviewed: 01/14/2015 Elsevier Interactive Patient Education  2017 [employer].

## 2017-01-12 NOTE — Consult Note (Signed)
[facility] CM Primary Care Navigator  01/12/2017  Lydia Jordan [birth year] [id number]   Met with patient and daughter Otila Kluver A.) at the bedside to identify possible discharge needs. Patient reports having uncontrollable nose bleeding at home that had led to this admission. Patient endorses Dr. Lavone Orn with [facility] as her primary care provider.   Patient shared using [facility] to obtain medications without any problem.   Patient reports that her daughter manages her medications at home using "pill box" system weekly.   She verbalized that daughter provides transportation to her doctors' appointments.  Patient lives with daughter and states that she is still capable of taking care of herself. Daughter is the primary caregiver at home as stated.  According to patient, anticipated discharge plan is home with daughter to assist as needed.  Patient and daughter voiced understanding to call primary care provider's office when she returns home, for a post discharge follow-up appointment within a week or sooner if needs arise. Patient letter (with PCP's contact number) was provided as a reminder.  Both denied any health management needs or concerns at this time.    For questions, please contact:  Dannielle Huh, BSN, RN- [facility] Primary Care Navigator  Telephone: [phone] [place]

## 2017-01-12 NOTE — Care Management Note (Signed)
Case Management Note  Patient Details  Name: Lydia Jordan MRN: [id number] Date of Birth: [birth year]  Subjective/Objective:  Pt presented for Chest Pain. Post Cardiac Cath. Plan will be for d/c home.                 Action/Plan: Pt wants DME [place] for d/c home. CM did speak with patient and pt is agreeable to using [facility] for DME needs. Referral made and DME to be delivered to room before d/c. No further needs from CM at this time.   Expected Discharge Date:  01/12/17               Expected Discharge Plan:  Home/Self Care  In-House Referral:  NA  Discharge planning Services  CM Consult  Post Acute Care Choice:  Durable Medical Equipment Choice offered to:  Patient  DME Arranged:  [facility]ford Rile rolling DME Agency:  [place]:  NA [place] Agency:  NA  Status of Service:  Completed, signed off  If discussed at [place] of Stay Meetings, dates discussed:    Additional Comments:  Bethena Roys, RN 01/12/2017, 2:56 PM

## 2017-01-12 NOTE — Progress Notes (Signed)
VASCULAR LAB PRELIMINARY  ARTERIAL  ABI completed:ABIs are within normal limits at rest.    RIGHT    LEFT    PRESSURE WAVEFORM  PRESSURE WAVEFORM  BRACHIAL restricted T BRACHIAL 120 T  DP 139 T DP 135 T  AT   AT    PT 136 T PT 140 T  PER   PER    GREAT TOE  NA GREAT TOE  NA    RIGHT LEFT  ABI 1.16 1.17     Rhealyn Cullen, RVT 01/12/2017, 10:40 AM

## 2017-01-12 NOTE — Progress Notes (Signed)
Pt left facility via wheelchair with daughter and no signs of distress. IV's removed. Pt and daughter verbalized discharge instructions.

## 2017-01-16 DIAGNOSIS — L97522 Non-pressure chronic ulcer of other part of left foot with fat layer exposed: Secondary | ICD-10-CM | POA: Diagnosis not present

## 2017-01-18 ENCOUNTER — Ambulatory Visit (INDEPENDENT_AMBULATORY_CARE_PROVIDER_SITE_OTHER): Payer: Medicare Other | Admitting: Vascular Surgery

## 2017-01-18 ENCOUNTER — Encounter: Payer: Self-pay | Admitting: Vascular Surgery

## 2017-01-18 VITALS — BP 116/74 | HR 85 | Temp 97.6°F | Resp 16 | Ht 63.0 in | Wt 128.0 lb

## 2017-01-18 DIAGNOSIS — I739 Peripheral vascular disease, unspecified: Secondary | ICD-10-CM

## 2017-01-18 NOTE — Progress Notes (Signed)
Patient name: Lydia Jordan MRN: [id number] DOB: 09-Dec-1933 Sex: female  REASON FOR CONSULT: Bilateral painful feet with peripheral vascular disease. Referred by Dr. Lavone Orn.  HPI: Lydia Jordan is a 83 y.o. female, who was recently hospitalized after she had a nosebleed and was having some cardiac issues. She had elevated troponins. The patient had a heart cath on 01/11/2017.This showed a 50% ostial right coronary artery stenosis and a 50% mid LAD stenosis. Left ventricular systolic function was normal. Ejection fraction was 55-65%. It was felt that the elevated troponins were likely related to A. fib with a rapid ventricular response.  The patient is on Eliquis, for history of atrial fibrillation.  During this admission the patient was noted to have some discoloration of her feet and is set up for vascular consultation. Of note she did have an arterial Doppler study during that admission which showed normal perfusion at rest.  I do not get any history of claudication, rest pain. She does have a crack on her right heel and a wound on her left great toe. The wound on her toe was related to an injury where she stubbed it on something at home.   Past Medical History:  Diagnosis Date  . Acid reflux   . Arthritis   . Atrial fibrillation ([place])   . Hypertension   . Hypothyroidism   . Irregular heart rhythm   . Thyroid disease     Family History  Problem Relation Age of Onset  . Hypertension Mother   . Anuerysm Mother     died in her 40's  . Arthritis Father     died at age 53  . Heart attack Brother     died in 53's    SOCIAL HISTORY: Social History   Social History  . Marital status: Single    Spouse name: N/A  . Number of children: N/A  . Years of education: N/A   Occupational History  . Not on file.   Social History Main Topics  . Smoking status: Former Research scientist (life sciences)  . Smokeless tobacco: Never Used     Comment: Former occasional smoker x 3 yrs.  . Alcohol  use No  . Drug use: No  . Sexual activity: Not on file   Other Topics Concern  . Not on file   Social History Narrative  . No narrative on file    Allergies  Allergen Reactions  . Demerol [Meperidine] Anaphylaxis    Reaction: "Heart stopped"    Current Outpatient Prescriptions  Medication Sig Dispense Refill  . apixaban (ELIQUIS) 5 MG TABS tablet Take 1 tablet (5 mg total) by mouth 2 (two) times daily.    Marland Kitchen aspirin EC 81 MG EC tablet Take 1 tablet (81 mg total) by mouth daily. 30 tablet 6  . atorvastatin (LIPITOR) 20 MG tablet Take 1 tablet (20 mg total) by mouth daily. 30 tablet 6  . digoxin (LANOXIN) 0.125 MG tablet Take 0.125 mg by mouth every morning.    . DULoxetine (CYMBALTA) 30 MG capsule Take 30 mg by mouth at bedtime.     . DULoxetine (CYMBALTA) 60 MG capsule Take 60 mg by mouth daily.  1  . HYDROcodone-acetaminophen (NORCO/VICODIN) 5-325 MG tablet Take 1 tablet by mouth 2 (two) times daily. Pain    . Liniments (SALONPAS PAIN RELIEF PATCH EX) Apply 1 patch topically daily as needed. For muscle pain    . nebivolol (BYSTOLIC) 5 MG tablet Take 5 mg by mouth every  morning.    . nitroGLYCERIN (NITROSTAT) 0.4 MG SL tablet Place 1 tablet (0.4 mg total) under the tongue every 5 (five) minutes x 3 doses as needed for chest pain. 12 tablet 2  . omeprazole (PRILOSEC) 40 MG capsule Take 40 mg by mouth at bedtime.    Marland Kitchen SYNTHROID 100 MCG tablet Take 100 mcg by mouth daily before breakfast.  11   No current facility-administered medications for this visit.     REVIEW OF SYSTEMS:  [X]  denotes positive finding, [ ]  denotes negative finding Cardiac  Comments:  Chest pain or chest pressure:    Shortness of breath upon exertion: X   Short of breath when lying flat:    Irregular heart rhythm: X       Vascular    Pain in calf, thigh, or hip brought on by ambulation:    Pain in feet at night that wakes you up from your sleep:     Blood clot in your veins:    Leg swelling:  X         Pulmonary    Oxygen at home:    Productive cough:     Wheezing:         Neurologic    Sudden weakness in arms or legs:     Sudden numbness in arms or legs:     Sudden onset of difficulty speaking or slurred speech:    Temporary loss of vision in one eye:     Problems with dizziness:  X       Gastrointestinal    Blood in stool:     Vomited blood:         Genitourinary    Burning when urinating:     Blood in urine:        Psychiatric    Major depression:         Hematologic    Bleeding problems:    Problems with blood clotting too easily:        Skin    Rashes or ulcers:        Constitutional    Fever or chills:      PHYSICAL EXAM: Vitals:   01/18/17 1101  BP: 116/74  Pulse: 85  Resp: 16  Temp: 97.6 F (36.4 C)  TempSrc: Oral  SpO2: 99%  Weight: 128 lb (58.1 kg)  Height: 5\' 3"  (1.6 m)    GENERAL: The patient is a well-nourished female, in no acute distress. The vital signs are documented above. CARDIAC: There is a regular rate and rhythm.  VASCULAR: I do not detect carotid bruits.  She has palpable femoral, popliteal, dorsalis pedis, and posterior tibial pulses bilaterally.  She has some dependent rubor.  She has mild bilateral lower extremity swelling.  PULMONARY: There is good air exchange bilaterally without wheezing or rales. ABDOMEN: Soft and non-tender with normal pitched bowel sounds.  MUSCULOSKELETAL: There are no major deformities or cyanosis. NEUROLOGIC: No focal weakness or paresthesias are detected. SKIN: There are no ulcers or rashes noted. PSYCHIATRIC: The patient has a normal affect.  DATA:   ARTERIAL DOPPLER STUDY: I have reviewed the arterial Doppler study that was done on 01/12/2017. The waveforms in the feet were normal at rest. ABI was 100% bilaterally.  MEDICAL ISSUES:  BILATERAL FOOT WOUNDS: The patient has a crack on the right heel and also an ulceration on the left great toe from an injury. She thinks that the crack on the  right heel is from leg  swelling which she has intermittently. Based on her exam and Doppler study she has no evidence of significant arterial insufficiency and she should have adequate circulation to heal the wounds on her feet. I have referred her to the wound care center here in [facility] however for aggressive outpatient wound care. With respect to [facility] leg swelling, I have encouraged her to elevate her legs above her heart when she's having issues with swelling as this could interfere with wound healing or create wounds. I do not think that any further vascular workup is indicated at this point given that she has normal arterial Doppler studies and palpable pedal pulses. I'll be happy to see her back in the future for any vascular issues arise.  Deitra Mayo [facility] [phone]

## 2017-01-24 ENCOUNTER — Encounter: Payer: Self-pay | Admitting: Physician Assistant

## 2017-01-24 DIAGNOSIS — I499 Cardiac arrhythmia, unspecified: Secondary | ICD-10-CM | POA: Insufficient documentation

## 2017-01-24 NOTE — Progress Notes (Signed)
Cardiology Office Note    Date:  01/26/2017   ID:  [name], DOB Dec 07, 1933, MRN [id number]  PCP:  Irven Shelling, MD  Cardiologist:  Dr. Acie Fredrickson  Chief Complaint: Hospital follow up for afib and NSTEMI  History of Present Illness:   Lydia Jordan is a 83 y.o. female with hx of persistent AF on Eliquis, HTN, HLD, hypothyroidism and GERD presents for hospital follow up.   Hx of afib dating back to 2007. Managed by PCP with Eliquis, Bystolic and digoxin.   Presented to [facility] 4/10 with nose bleed and chest pain. Dr. Erik Obey cauterized nosebleed which did not continue to be an issue this admission. Troponin peaked at 0.84 and then trended down. Cath reveals ectatic coronaries with moderate disease. No culprit lesions to explain patients positive troponins with preserved EF. Suspect that its related to A.fib or stress. Recommended medical therapy. Resumed Eliquis. May consider outpatient DCCV if elevated rate.  Patient had some discoloration of her feet. arterial Doppler study during that admission which showed normal perfusion at rest.  The patient was seen by Dr. Scot Dock 01/18/17 as outpatient. No recommended any further work. Follow up PRN. Referred to would care for bilateral foot woods.    Here for follow-up. Denies any reoccurrence of chest pain. No shortness of breath, orthopnea, PND, syncope, lower extremity edema, melena, palpitations or blood in his stool or urine. She has noted arthritis pain since discontinuation of diclofenac in the hospital. Started on aspirin 81 mg at that time.    Past Medical History:  Diagnosis Date  . Acid reflux   . Arthritis   . Atrial fibrillation ([place])   . CAD (coronary artery disease)    Cath 01/11/17 showed 50% ost RCA and 50% mLAD --> medical therapy   . Hypertension   . Hypothyroidism   . Irregular heart rhythm   . Thyroid disease     Past Surgical History:  Procedure Laterality Date  . ESOPHAGEAL MANOMETRY N/A 07/31/2015     Procedure: ESOPHAGEAL MANOMETRY (EM);  Surgeon: Arta Silence, MD;  Location: [facility] ENDOSCOPY;  Service: Endoscopy;  Laterality: N/A;  . ESOPHAGOGASTRODUODENOSCOPY (EGD) WITH PROPOFOL N/A 09/02/2015   Procedure: ESOPHAGOGASTRODUODENOSCOPY (EGD) WITH PROPOFOL;  Surgeon: Arta Silence, MD;  Location: [facility] ENDOSCOPY;  Service: Endoscopy;  Laterality: N/A;  botulinum toxin injection (100 Units; 25 units in 4 quadrants 1-2 cm proximal to GE junction  . LEFT HEART CATH AND CORONARY ANGIOGRAPHY N/A 01/11/2017   Procedure: Left Heart Cath and Coronary Angiography;  Surgeon: Leonie Man, MD;  Location: [place] [facility];  Service: Cardiovascular;  Laterality: N/A;  . skin cancer area removed      right leg healing well  . VAGINAL HYSTERECTOMY      Current Medications: Prior to Admission medications   Medication Sig Start Date End Date Taking? Authorizing Provider  apixaban (ELIQUIS) 5 MG TABS tablet Take 1 tablet (5 mg total) by mouth 2 (two) times daily. 09/04/15   Arta Silence, MD  aspirin EC 81 MG EC tablet Take 1 tablet (81 mg total) by mouth daily. 01/13/17   Tiffany Carlota Raspberry, PA-C  atorvastatin (LIPITOR) 20 MG tablet Take 1 tablet (20 mg total) by mouth daily. 01/12/17   Tiffany Carlota Raspberry, PA-C  digoxin (LANOXIN) 0.125 MG tablet Take 0.125 mg by mouth every morning.    Historical Provider, MD  DULoxetine (CYMBALTA) 30 MG capsule Take 30 mg by mouth at bedtime.     Historical Provider, MD  DULoxetine (CYMBALTA)  60 MG capsule Take 60 mg by mouth daily. 01/02/17   Historical Provider, MD  HYDROcodone-acetaminophen (NORCO/VICODIN) 5-325 MG tablet Take 1 tablet by mouth 2 (two) times daily. Pain    Historical Provider, MD  Liniments (SALONPAS PAIN RELIEF PATCH EX) Apply 1 patch topically daily as needed. For muscle pain    Historical Provider, MD  nebivolol (BYSTOLIC) 5 MG tablet Take 5 mg by mouth every morning.    Historical Provider, MD  nitroGLYCERIN (NITROSTAT) 0.4 MG SL tablet Place 1 tablet (0.4 mg  total) under the tongue every 5 (five) minutes x 3 doses as needed for chest pain. 01/12/17   Tiffany Carlota Raspberry, PA-C  omeprazole (PRILOSEC) 40 MG capsule Take 40 mg by mouth at bedtime.    Historical Provider, MD  SYNTHROID 100 MCG tablet Take 100 mcg by mouth daily before breakfast. 06/26/15   Historical Provider, MD    Allergies:   Demerol [meperidine]   Social History   Social History  . Marital status: Single    Spouse name: N/A  . Number of children: N/A  . Years of education: N/A   Social History Main Topics  . Smoking status: Former Research scientist (life sciences)  . Smokeless tobacco: Never Used     Comment: Former occasional smoker x 3 yrs.  . Alcohol use No  . Drug use: No  . Sexual activity: Not Asked   Other Topics Concern  . None   Social History Narrative  . None     Family History:  The patient's family history includes Anuerysm in her mother; Arthritis in her father; Heart attack in her brother; Hypertension in her mother.   ROS:   Please see the history of present illness.    ROS All other systems reviewed and are negative.   PHYSICAL EXAM:   VS:  BP 140/84   Pulse 78   Ht 5\' 3"  (1.6 m)   Wt 128 lb (58.1 kg)   SpO2 94%   BMI 22.67 kg/m    GEN: Well nourished, well developed, in no acute distress  HEENT: normal  Neck: no JVD, carotid bruits, or masses Cardiac: Ir IR; no murmurs, rubs, or gallops,no edema  Respiratory:  clear to auscultation bilaterally, normal work of breathing GI: soft, nontender, nondistended, + BS MS: no deformity or atrophy  Skin: warm and dry, no rash Neuro:  Alert and Oriented x 3, Strength and sensation are intact Psych: euthymic mood, full affect  Wt Readings from Last 3 Encounters:  01/26/17 128 lb (58.1 kg)  01/18/17 128 lb (58.1 kg)  01/12/17 125 lb 9.6 oz (57 kg)      Studies/Labs Reviewed:   EKG:  EKG is not ordered today.    Recent Labs: 01/11/2017: BUN 35; Creatinine, Ser 1.22; Hemoglobin 10.4; Platelets 210; Potassium 3.9; Sodium  131   Lipid Panel    Component Value Date/Time   CHOL 168 01/11/2017 0237   TRIG 111 01/11/2017 0237   HDL 41 01/11/2017 0237   CHOLHDL 4.1 01/11/2017 0237   VLDL 22 01/11/2017 0237   LDLCALC 105 (H) 01/11/2017 0237    Additional studies/ records that were reviewed today include:   Echocardiogram: 01/11/17 Study Conclusions  - Left ventricle: The cavity size was normal. There was moderate   focal basal hypertrophy of the septum. Systolic function was   normal. The estimated ejection fraction was in the range of 60%   to 65%. Wall motion was normal; there were no regional wall   motion abnormalities. -  Aortic valve: Transvalvular velocity was within the normal range.   There was no stenosis. There was mild regurgitation.   Regurgitation pressure half-time: 687 ms. - Aorta: Ascending aorta diameter: 50 mm (ED). - Aortic root: The aortic root was severely dilated. - Mitral valve: Transvalvular velocity was within the normal range.   There was no evidence for stenosis. There was trivial   regurgitation. - Left atrium: The atrium was severely dilated. - Right ventricle: The cavity size was normal. Wall thickness was   normal. Systolic function was normal. - Right atrium: The atrium was severely dilated. - Tricuspid valve: There was mild regurgitation, with multiple   jets. - Pulmonary arteries: Systolic pressure was within the normal   range. PA peak pressure: 32 mm Hg (S).  Cardiac Catheterization: 01/11/17  Ost RCA lesion, 50 %stenosed.  Mid LAD lesion, 50 %stenosed.  The left ventricular systolic function is normal. The left ventricular ejection fraction is 55-65% by visual estimate.  LV end diastolic pressure is normal.   Intragraft the mild to moderate disease but no flow-limiting lesions with preserved EF and normal LVEDP. Large-caliber almost ectatic vessels for patient's age.  No culprit lesion to explain the patient's positive troponins. Suspect could be  related to A. fib RVR or stress.  Plan: Monitor tonight and restart ELIQUIS tomorrow. Medical management for mild to moderate CAD.   ASSESSMENT & PLAN:    1. Persistent atrial fibrillation since 2007 - Rate Control. Continue diastolic. Continue Eliquis for anticoagulation  2. Non obstructive CAD - Cath 01/11/17 showed 50% ost RCA and 50% mLAD. Recommended medical therapy for non flow limiting lesion. Preserved LV function. Continue statin and beta blocker. She has noted arthritic pain since discontinuation of diclofenac in the hospital as she was started on aspirin 81 mg. She we will discontinue her aspirin she will restart NSAIDs for arthritis pain. No bleeding tissue. She is on an request.  3. Bilateral lower foot wound - Normal arterial perfusion study. Dr. Consuello Bossier recommended no further work up. Wound care follo wup.   4. HTN - Minimally elevated today to 140/84. Blood pressure runs normal at home. Advised to keep block. Continue diastolic 5 mg daily.  5. HLD - 01/11/2017: Cholesterol 168; HDL 41; LDL Cholesterol 105; Triglycerides 111; VLDL 22  - Continue statin   Medication Adjustments/Labs and Tests Ordered: Current medicines are reviewed at length with the patient today.  Concerns regarding medicines are outlined above.  Medication changes, Labs and Tests ordered today are listed in the Patient Instructions below. Patient Instructions  Medication Instructions:  STOP aspirin  Labwork: None ordered  Testing/Procedures: None ordered  Follow-Up: Follow up with Dr. Acie Fredrickson as needed  Any Other Special Instructions Will Be Listed Below (If Applicable).     If you need a refill on your cardiac medications before your next appointment, please call your pharmacy.     Jarrett Soho, [place]  01/26/2017 10:45 AM    [address], [place], [place]  [address] Phone: [phone]; Fax: [phone]

## 2017-01-26 ENCOUNTER — Encounter: Payer: Self-pay | Admitting: Physician Assistant

## 2017-01-26 ENCOUNTER — Ambulatory Visit (INDEPENDENT_AMBULATORY_CARE_PROVIDER_SITE_OTHER): Payer: Medicare Other | Admitting: Physician Assistant

## 2017-01-26 VITALS — BP 140/84 | HR 78 | Ht 63.0 in | Wt 128.0 lb

## 2017-01-26 DIAGNOSIS — I4819 Other persistent atrial fibrillation: Secondary | ICD-10-CM

## 2017-01-26 DIAGNOSIS — I2510 Atherosclerotic heart disease of native coronary artery without angina pectoris: Secondary | ICD-10-CM

## 2017-01-26 DIAGNOSIS — E7849 Other hyperlipidemia: Secondary | ICD-10-CM

## 2017-01-26 DIAGNOSIS — E784 Other hyperlipidemia: Secondary | ICD-10-CM

## 2017-01-26 DIAGNOSIS — I481 Persistent atrial fibrillation: Secondary | ICD-10-CM

## 2017-01-26 DIAGNOSIS — I10 Essential (primary) hypertension: Secondary | ICD-10-CM

## 2017-01-26 MED ORDER — DICLOFENAC SODIUM 50 MG PO TBEC: 50.0000 mg | DELAYED_RELEASE_TABLET | Freq: Two times a day (BID) | ORAL | 3 refills | Status: DC

## 2017-01-26 NOTE — Patient Instructions (Addendum)
Medication Instructions:  STOP aspirin   RESTART diclofenac 50 mg twice daily.  Labwork: None ordered  Testing/Procedures: None ordered  Follow-Up: Follow up with Dr. Acie Fredrickson as needed  Any Other Special Instructions Will Be Listed Below (If Applicable).     If you need a refill on your cardiac medications before your next appointment, please call your pharmacy.

## 2017-01-26 NOTE — Addendum Note (Signed)
Addended by: Drue Novel I on: 01/26/2017 10:50 AM   Modules accepted: Orders

## 2017-01-31 ENCOUNTER — Encounter (HOSPITAL_BASED_OUTPATIENT_CLINIC_OR_DEPARTMENT_OTHER): Payer: Medicare Other | Attending: Surgery

## 2017-01-31 DIAGNOSIS — I890 Lymphedema, not elsewhere classified: Secondary | ICD-10-CM | POA: Diagnosis not present

## 2017-01-31 DIAGNOSIS — L97521 Non-pressure chronic ulcer of other part of left foot limited to breakdown of skin: Secondary | ICD-10-CM | POA: Diagnosis not present

## 2017-01-31 DIAGNOSIS — L97411 Non-pressure chronic ulcer of right heel and midfoot limited to breakdown of skin: Secondary | ICD-10-CM | POA: Insufficient documentation

## 2017-01-31 DIAGNOSIS — Z87891 Personal history of nicotine dependence: Secondary | ICD-10-CM | POA: Diagnosis not present

## 2017-01-31 DIAGNOSIS — L97522 Non-pressure chronic ulcer of other part of left foot with fat layer exposed: Secondary | ICD-10-CM | POA: Insufficient documentation

## 2017-01-31 DIAGNOSIS — I10 Essential (primary) hypertension: Secondary | ICD-10-CM | POA: Diagnosis not present

## 2017-01-31 DIAGNOSIS — B351 Tinea unguium: Secondary | ICD-10-CM | POA: Insufficient documentation

## 2017-01-31 DIAGNOSIS — Z79899 Other long term (current) drug therapy: Secondary | ICD-10-CM | POA: Diagnosis not present

## 2017-01-31 DIAGNOSIS — Z7982 Long term (current) use of aspirin: Secondary | ICD-10-CM | POA: Insufficient documentation

## 2017-01-31 DIAGNOSIS — I4891 Unspecified atrial fibrillation: Secondary | ICD-10-CM | POA: Insufficient documentation

## 2017-01-31 DIAGNOSIS — L97421 Non-pressure chronic ulcer of left heel and midfoot limited to breakdown of skin: Secondary | ICD-10-CM | POA: Insufficient documentation

## 2017-01-31 DIAGNOSIS — L97512 Non-pressure chronic ulcer of other part of right foot with fat layer exposed: Secondary | ICD-10-CM | POA: Insufficient documentation

## 2017-01-31 DIAGNOSIS — Z7901 Long term (current) use of anticoagulants: Secondary | ICD-10-CM | POA: Diagnosis not present

## 2017-02-08 ENCOUNTER — Encounter: Payer: Self-pay | Admitting: Podiatry

## 2017-02-08 ENCOUNTER — Ambulatory Visit (INDEPENDENT_AMBULATORY_CARE_PROVIDER_SITE_OTHER): Payer: Medicare Other | Admitting: Podiatry

## 2017-02-08 VITALS — BP 152/82 | HR 78

## 2017-02-08 DIAGNOSIS — B351 Tinea unguium: Secondary | ICD-10-CM

## 2017-02-08 DIAGNOSIS — I2510 Atherosclerotic heart disease of native coronary artery without angina pectoris: Secondary | ICD-10-CM | POA: Diagnosis not present

## 2017-02-08 NOTE — Progress Notes (Signed)
   Subjective:    Patient ID: Lydia Jordan, female    DOB: 06-May-1934, 83 y.o.   MRN: [id number]  HPI This patient presents today at the recommendation of wound care for evaluation of fungal toenails and right and left feet. Patient said no specific treatment for the nails. She describes inability trim the nails and the nails are negative difficult for her to trim. She describes the nails gradually changing texture and color over a multiple month. Patient has a pending wound care appointment scheduled today on May 9 for evaluation of skin ulceration. Patient fell this morning prior to entering our office and he had some swelling in or out her for head. Local compression and ice was applied to this area prior to entering the treatment room and recommended that she present to urgent care for further evaluation  The patient's sisters presently treatment today  Patient discontinue smoking 50+ years ago  Review of Systems  Cardiovascular: Positive for leg swelling.  Musculoskeletal: Positive for arthralgias and gait problem.  All other systems reviewed and are negative.      Objective:   Physical Exam  Pleasant orientated 3  Vascular: Bilateral peripheral pitting edema DP pulses 2/4 bilaterally PT pulses 1/4 bilaterally Capillary reflex immediate bilaterally  Neurological: Sensation to 10 g monofilament wire intact 5/5 bilaterally Vibratory sensation nonreactive bilaterally Ankle reflexes reactive bilaterally  Dermatological: Atrophic skin with absent hair growth bilaterally Distal third right toe has superficial ulcer with moist regular basis. There is no surrounding erythema edema Eschars distal second and fourth right toes Eschar medial left hallux Inflamed fissure plantar left heel Eschar plantar right heel The toenails are brittle, yellow, scaling and loosening 6-10  Manual motor testing: Dorsi flexion, plantar flexion, inversion, eversion 5/5 bilaterally      Assessment & Plan:   Assessment: Decrease pedal pulses bilaterally Peripheral neuropathy Superficial skin ulcer distal right toe undetermined origin with pending evaluation with wound care this afternoon Mycotic toenails 6-10 Fissured right and left heels Trauma from fall activated evaluation today by urgent care primary care physician  Plan: The toenails 6-10 or debrided mechanically electronically without a bleeding. Her recommend periodic debridement Silvadene dressing applied to the distal third right toe skin ulcer Recommended topical antibiotic ointment plantar fascial left cover with Band-Aid Keep scheduled visit with wound care today on 02/08/2017 Instructed. patient have medical follow-up for head trauma from fall today  Reappoint at patient's request

## 2017-02-08 NOTE — Patient Instructions (Signed)
Keep scheduled appointment with wound care today 02/08/2017 Return as needed for debridement/trimming of the mycotic (fungal toenails) as needed Okay to apply antibiotic ointment to the painful fissures on the heels    Today upon arrival to your office you fell resulting in some bruising and swelling to your for your head. Please have this evaluated and further detail by your primary care physician or urgent care

## 2017-02-08 NOTE — Progress Notes (Signed)
This nurse witnessed patient sitting on the sidewalk at 10:30am. Patient A&O x4, Nuero WNL, denies significant pain at this time. Full ROM to all 4 ext without pain. Noted raised swelling and hematoma with mild bleeding to Rt side of patient forehead. Also noted abrasion to Rt shoulder and Rt hand. Patient moved into wheelchair and taken to exam room, VS obtain and were WNL. She did c/o Rt hip pain, 5 of 10. Advised her to go home, rest, call PCP for follow up on hip pain or any other acute s/s changes. Any severe changes in pain or changes in LOC need to report to ER.  Patient was taken to her car via wheelchair and was accompanied by her sister who verbalized understanding of instructions

## 2017-02-11 ENCOUNTER — Emergency Department (HOSPITAL_COMMUNITY): Payer: Medicare Other

## 2017-02-11 ENCOUNTER — Inpatient Hospital Stay (HOSPITAL_COMMUNITY)
Admission: EM | Admit: 2017-02-11 | Discharge: 2017-02-21 | DRG: 220 | Disposition: A | Discharge status: Rehabilitation Facility | Payer: Medicare Other | Attending: Thoracic Surgery (Cardiothoracic Vascular Surgery) | Admitting: Thoracic Surgery (Cardiothoracic Vascular Surgery)

## 2017-02-11 ENCOUNTER — Encounter (HOSPITAL_COMMUNITY): Payer: Self-pay | Admitting: Emergency Medicine

## 2017-02-11 DIAGNOSIS — R5381 Other malaise: Secondary | ICD-10-CM | POA: Diagnosis present

## 2017-02-11 DIAGNOSIS — I3139 Other pericardial effusion (noninflammatory): Secondary | ICD-10-CM | POA: Diagnosis present

## 2017-02-11 DIAGNOSIS — Y92481 Parking lot as the place of occurrence of the external cause: Secondary | ICD-10-CM

## 2017-02-11 DIAGNOSIS — R269 Unspecified abnormalities of gait and mobility: Secondary | ICD-10-CM | POA: Diagnosis present

## 2017-02-11 DIAGNOSIS — S0990XA Unspecified injury of head, initial encounter: Secondary | ICD-10-CM | POA: Diagnosis not present

## 2017-02-11 DIAGNOSIS — I5033 Acute on chronic diastolic (congestive) heart failure: Secondary | ICD-10-CM | POA: Diagnosis present

## 2017-02-11 DIAGNOSIS — I7389 Other specified peripheral vascular diseases: Secondary | ICD-10-CM | POA: Diagnosis present

## 2017-02-11 DIAGNOSIS — I482 Chronic atrial fibrillation: Secondary | ICD-10-CM | POA: Diagnosis present

## 2017-02-11 DIAGNOSIS — I481 Persistent atrial fibrillation: Secondary | ICD-10-CM | POA: Diagnosis not present

## 2017-02-11 DIAGNOSIS — M1990 Unspecified osteoarthritis, unspecified site: Secondary | ICD-10-CM | POA: Diagnosis present

## 2017-02-11 DIAGNOSIS — R0682 Tachypnea, not elsewhere classified: Secondary | ICD-10-CM

## 2017-02-11 DIAGNOSIS — D689 Coagulation defect, unspecified: Secondary | ICD-10-CM | POA: Diagnosis not present

## 2017-02-11 DIAGNOSIS — J90 Pleural effusion, not elsewhere classified: Secondary | ICD-10-CM | POA: Diagnosis not present

## 2017-02-11 DIAGNOSIS — Z791 Long term (current) use of non-steroidal anti-inflammatories (NSAID): Secondary | ICD-10-CM

## 2017-02-11 DIAGNOSIS — I712 Thoracic aortic aneurysm, without rupture: Secondary | ICD-10-CM | POA: Diagnosis present

## 2017-02-11 DIAGNOSIS — J9811 Atelectasis: Secondary | ICD-10-CM

## 2017-02-11 DIAGNOSIS — I351 Nonrheumatic aortic (valve) insufficiency: Secondary | ICD-10-CM | POA: Diagnosis present

## 2017-02-11 DIAGNOSIS — R1310 Dysphagia, unspecified: Secondary | ICD-10-CM

## 2017-02-11 DIAGNOSIS — Z9889 Other specified postprocedural states: Secondary | ICD-10-CM

## 2017-02-11 DIAGNOSIS — D72829 Elevated white blood cell count, unspecified: Secondary | ICD-10-CM | POA: Diagnosis not present

## 2017-02-11 DIAGNOSIS — I7100 Dissection of unspecified site of aorta: Secondary | ICD-10-CM | POA: Diagnosis not present

## 2017-02-11 DIAGNOSIS — S0083XA Contusion of other part of head, initial encounter: Secondary | ICD-10-CM | POA: Diagnosis not present

## 2017-02-11 DIAGNOSIS — M542 Cervicalgia: Secondary | ICD-10-CM | POA: Diagnosis present

## 2017-02-11 DIAGNOSIS — Z951 Presence of aortocoronary bypass graft: Secondary | ICD-10-CM

## 2017-02-11 DIAGNOSIS — R079 Chest pain, unspecified: Secondary | ICD-10-CM

## 2017-02-11 DIAGNOSIS — Z79891 Long term (current) use of opiate analgesic: Secondary | ICD-10-CM

## 2017-02-11 DIAGNOSIS — I252 Old myocardial infarction: Secondary | ICD-10-CM

## 2017-02-11 DIAGNOSIS — Z90710 Acquired absence of both cervix and uterus: Secondary | ICD-10-CM

## 2017-02-11 DIAGNOSIS — I2510 Atherosclerotic heart disease of native coronary artery without angina pectoris: Secondary | ICD-10-CM | POA: Diagnosis present

## 2017-02-11 DIAGNOSIS — I7101 Dissection of ascending aorta: Secondary | ICD-10-CM

## 2017-02-11 DIAGNOSIS — Z885 Allergy status to narcotic agent status: Secondary | ICD-10-CM

## 2017-02-11 DIAGNOSIS — D649 Anemia, unspecified: Secondary | ICD-10-CM | POA: Diagnosis present

## 2017-02-11 DIAGNOSIS — Z953 Presence of xenogenic heart valve: Secondary | ICD-10-CM

## 2017-02-11 DIAGNOSIS — Z8249 Family history of ischemic heart disease and other diseases of the circulatory system: Secondary | ICD-10-CM

## 2017-02-11 DIAGNOSIS — I7121 Aneurysm of the ascending aorta, without rupture: Secondary | ICD-10-CM | POA: Diagnosis present

## 2017-02-11 DIAGNOSIS — Z79899 Other long term (current) drug therapy: Secondary | ICD-10-CM

## 2017-02-11 DIAGNOSIS — Z952 Presence of prosthetic heart valve: Secondary | ICD-10-CM

## 2017-02-11 DIAGNOSIS — G8929 Other chronic pain: Secondary | ICD-10-CM | POA: Diagnosis present

## 2017-02-11 DIAGNOSIS — W19XXXA Unspecified fall, initial encounter: Secondary | ICD-10-CM

## 2017-02-11 DIAGNOSIS — I313 Pericardial effusion (noninflammatory): Secondary | ICD-10-CM | POA: Diagnosis not present

## 2017-02-11 DIAGNOSIS — E039 Hypothyroidism, unspecified: Secondary | ICD-10-CM | POA: Diagnosis present

## 2017-02-11 DIAGNOSIS — S32591A Other specified fracture of right pubis, initial encounter for closed fracture: Secondary | ICD-10-CM

## 2017-02-11 DIAGNOSIS — Z8679 Personal history of other diseases of the circulatory system: Secondary | ICD-10-CM

## 2017-02-11 DIAGNOSIS — I5032 Chronic diastolic (congestive) heart failure: Secondary | ICD-10-CM | POA: Diagnosis present

## 2017-02-11 DIAGNOSIS — G894 Chronic pain syndrome: Secondary | ICD-10-CM | POA: Diagnosis present

## 2017-02-11 DIAGNOSIS — E876 Hypokalemia: Secondary | ICD-10-CM

## 2017-02-11 DIAGNOSIS — K219 Gastro-esophageal reflux disease without esophagitis: Secondary | ICD-10-CM | POA: Diagnosis present

## 2017-02-11 DIAGNOSIS — W01198A Fall on same level from slipping, tripping and stumbling with subsequent striking against other object, initial encounter: Secondary | ICD-10-CM | POA: Diagnosis present

## 2017-02-11 DIAGNOSIS — I4811 Longstanding persistent atrial fibrillation: Secondary | ICD-10-CM | POA: Diagnosis present

## 2017-02-11 DIAGNOSIS — Z7901 Long term (current) use of anticoagulants: Secondary | ICD-10-CM

## 2017-02-11 DIAGNOSIS — M25551 Pain in right hip: Secondary | ICD-10-CM | POA: Diagnosis not present

## 2017-02-11 DIAGNOSIS — S0003XA Contusion of scalp, initial encounter: Secondary | ICD-10-CM | POA: Diagnosis not present

## 2017-02-11 DIAGNOSIS — D62 Acute posthemorrhagic anemia: Secondary | ICD-10-CM | POA: Diagnosis not present

## 2017-02-11 DIAGNOSIS — I10 Essential (primary) hypertension: Secondary | ICD-10-CM | POA: Diagnosis present

## 2017-02-11 DIAGNOSIS — Z87891 Personal history of nicotine dependence: Secondary | ICD-10-CM

## 2017-02-11 DIAGNOSIS — Z66 Do not resuscitate: Secondary | ICD-10-CM | POA: Diagnosis present

## 2017-02-11 DIAGNOSIS — D6959 Other secondary thrombocytopenia: Secondary | ICD-10-CM | POA: Diagnosis present

## 2017-02-11 DIAGNOSIS — N130 Hydronephrosis with ureteropelvic junction obstruction: Secondary | ICD-10-CM | POA: Diagnosis present

## 2017-02-11 HISTORY — DX: Presence of xenogenic heart valve: Z95.3

## 2017-02-11 HISTORY — DX: Dissection of ascending aorta: I71.010

## 2017-02-11 HISTORY — DX: Other specified fracture of right pubis, initial encounter for closed fracture: S32.591A

## 2017-02-11 HISTORY — DX: Chronic diastolic (congestive) heart failure: I50.32

## 2017-02-11 HISTORY — DX: Dissection of thoracic aorta: I71.01

## 2017-02-11 HISTORY — DX: Longstanding persistent atrial fibrillation: I48.11

## 2017-02-11 HISTORY — DX: Thoracic aortic aneurysm, without rupture: I71.2

## 2017-02-11 HISTORY — DX: Non-ST elevation (NSTEMI) myocardial infarction: I21.4

## 2017-02-11 HISTORY — DX: Cervical disc disorder, unspecified, unspecified cervical region: M50.90

## 2017-02-11 HISTORY — DX: Other chronic pain: G89.29

## 2017-02-11 HISTORY — DX: Unspecified osteoarthritis, unspecified site: M19.90

## 2017-02-11 HISTORY — DX: Unspecified hydronephrosis: N13.30

## 2017-02-11 HISTORY — DX: Personal history of other diseases of the circulatory system: Z86.79

## 2017-02-11 HISTORY — DX: Other specified postprocedural states: Z98.890

## 2017-02-11 LAB — I-STAT CHEM 8, ED
BUN: 20 mg/dL (ref 6–20)
CHLORIDE: 98 mmol/L — AB (ref 101–111)
CREATININE: 0.8 mg/dL (ref 0.44–1.00)
Calcium, Ion: 1.11 mmol/L — ABNORMAL LOW (ref 1.15–1.40)
Glucose, Bld: 106 mg/dL — ABNORMAL HIGH (ref 65–99)
HEMATOCRIT: 30 % — AB (ref 36.0–46.0)
Hemoglobin: 10.2 g/dL — ABNORMAL LOW (ref 12.0–15.0)
Potassium: 5 mmol/L (ref 3.5–5.1)
SODIUM: 133 mmol/L — AB (ref 135–145)
TCO2: 26 mmol/L (ref 0–100)

## 2017-02-11 LAB — I-STAT TROPONIN, ED: Troponin i, poc: 0 ng/mL (ref 0.00–0.08)

## 2017-02-11 MED ORDER — IOPAMIDOL (ISOVUE-300) INJECTION 61%
INTRAVENOUS | Status: AC
  Administered 2017-02-11: 75 mL
  Filled 2017-02-11: qty 75

## 2017-02-11 NOTE — ED Notes (Signed)
Pt updated on CT status, Ct sts they are running behind. Pt is 5th in line, approx 45 mins. Pt and family verbalized understanding.

## 2017-02-11 NOTE — ED Notes (Signed)
Pt returned from CT °

## 2017-02-11 NOTE — ED Provider Notes (Signed)
[facility] DEPT Provider Note   CSN: [id number] Arrival date & time: 02/11/17  1540     History   Chief Complaint Chief Complaint  Patient presents with  . Facial Injury  . Head Injury    on thinners  . Fall    HPI Lydia Jordan is a 83 y.o. female.  Patient is a 83 year old female with a significant medical history of atrial fibrillation on Eliquis, hypertension, coronary artery disease, and STEMI, hypertension presenting today after a fall 4 days ago. 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 She denies loss of consciousness but since that time she has had pain in her right hip with weightbearing requiring her to use a walker. She denies neck pain, headaches, vision changes, nausea or vomiting. No chest pain, shortness of breath or abdominal pain.  The pain is sharp in nature and worse with weightbearing. With sitting and resting she does not have pain. The pain is localized to the hip and groin and does not radiate down the leg. She has normal sensation in her foot   The history is provided by the patient.    Past Medical History:  Diagnosis Date  . Acid reflux   . Arthritis   . Atrial fibrillation ([place])   . CAD (coronary artery disease)    Cath 01/11/17 showed 50% ost RCA and 50% mLAD --> medical therapy   . Hypertension   . Hypothyroidism   . Irregular heart rhythm   . Thyroid disease     Patient Active Problem List   Diagnosis Date Noted  . Irregular heart rhythm   . S/P cardiac cath: (2018) a. mild to moderate disease but no flow limiting lesions with perserved EF. b. large-caliber almost ectatic vessels for patients age. 01/12/2017  . Hypertension 01/12/2017  . Atrial fibrillation, new onset ([place]) 01/12/2017  . CAD (coronary artery disease): mild to moderate 01/12/2017  . Epistaxis: (2018) cauterized by Dr. Erik Obey  59/56/3875  . NSTEMI (non-ST elevated myocardial infarction) ([place]) 01/10/2017    Past Surgical History:  Procedure Laterality Date  . ESOPHAGEAL MANOMETRY N/A 07/31/2015   Procedure: ESOPHAGEAL MANOMETRY (EM);  Surgeon: Arta Silence, MD;  Location: [facility];  Service: Endoscopy;  Laterality: N/A;  . ESOPHAGOGASTRODUODENOSCOPY (EGD) WITH PROPOFOL N/A 09/02/2015   Procedure: ESOPHAGOGASTRODUODENOSCOPY (EGD) WITH PROPOFOL;  Surgeon: Arta Silence, MD;  Location: [facility];  Service: Endoscopy;  Laterality: N/A;  botulinum toxin injection (100 Units; 25 units in 4 quadrants 1-2 cm proximal to GE junction  . LEFT HEART CATH AND CORONARY ANGIOGRAPHY N/A 01/11/2017   Procedure: Left Heart Cath and Coronary Angiography;  Surgeon: Leonie Man, MD;  Location: [facility];  Service: Cardiovascular;  Laterality: N/A;  . skin cancer area removed      right leg healing well  . VAGINAL HYSTERECTOMY      OB History    No data available       Home Medications    Prior to Admission medications   Medication Sig Start Date End Date Taking? Authorizing Provider  apixaban (ELIQUIS) 5 MG TABS tablet Take 1 tablet (5 mg total) by mouth 2 (two) times daily. 09/04/15   Arta Silence, MD  atorvastatin (LIPITOR) 20 MG tablet Take 1 tablet (20 mg total) by mouth daily. 01/12/17   Delos Haring, PA-C  diclofenac (VOLTAREN)  50 MG EC tablet Take 1 tablet (50 mg total) by mouth 2 (two) times daily. 01/26/17   Bhagat, Crista Luria, PA  digoxin (LANOXIN) 0.125 MG tablet Take 0.125 mg by mouth every morning.    [provider]  DULoxetine (CYMBALTA) 30 MG capsule Take 30 mg by mouth at bedtime.     [provider]  DULoxetine (CYMBALTA) 60 MG capsule Take 60 mg by mouth daily. 01/02/17   [provider]  HYDROcodone-acetaminophen (NORCO/VICODIN) 5-325 MG tablet Take 1 tablet by mouth 2 (two) times daily. Pain    [provider]  Liniments (SALONPAS PAIN RELIEF  PATCH EX) Apply 1 patch topically daily as needed. For muscle pain    [provider]  nebivolol (BYSTOLIC) 5 MG tablet Take 5 mg by mouth every morning.    [provider]  nitroGLYCERIN (NITROSTAT) 0.4 MG SL tablet Place 1 tablet (0.4 mg total) under the tongue every 5 (five) minutes x 3 doses as needed for chest pain. 01/12/17   Delos Haring, PA-C  omeprazole (PRILOSEC) 40 MG capsule Take 40 mg by mouth at bedtime.    [provider]  SYNTHROID 100 MCG tablet Take 100 mcg by mouth daily before breakfast. 06/26/15   [provider]    Family History Family History  Problem Relation Age of Onset  . Hypertension Mother   . Anuerysm Mother        died in her 29's  . Arthritis Father        died at age 63  . Heart attack Brother        died in 69's    Social History Social History  Substance Use Topics  . Smoking status: Former Research scientist (life sciences)  . Smokeless tobacco: Never Used     Comment: Former occasional smoker x 3 yrs.  . Alcohol use No     Allergies   Demerol [meperidine]   Review of Systems Review of Systems  All other systems reviewed and are negative.    Physical Exam Updated Vital Signs BP (!) 158/93   Pulse 97   Temp 98.7 F (37.1 C) (Oral)   Resp (!) 24   SpO2 98%   Physical Exam  Constitutional: She is oriented to person, place, and time. She appears well-developed and well-nourished. No distress.  HENT:  Head: Normocephalic. Head is with contusion.    Mouth/Throat: Oropharynx is clear and moist.  Eyes: Conjunctivae and EOM are normal. Pupils are equal, round, and reactive to light.  Neck: Normal range of motion. Neck supple. No spinous process tenderness and no muscular tenderness present.  Cardiovascular: Normal rate, regular rhythm and intact distal pulses.   No murmur heard. Pulmonary/Chest: Effort normal and breath sounds normal. No respiratory distress. She has no wheezes. She has no rales.  Abdominal: Soft. She  exhibits no distension. There is no tenderness. There is no rebound and no guarding.  Musculoskeletal: She exhibits tenderness. She exhibits no edema.       Right hip: She exhibits bony tenderness.       Right knee: Normal.       Right ankle: Normal.  Pain and hip with axial loading on the right foot  Neurological: She is alert and oriented to person, place, and time.  Skin: Skin is warm and dry. No rash noted. No erythema.  Psychiatric: She has a normal mood and affect. Her behavior is normal.  Nursing note and vitals reviewed.    ED Treatments / Results  Labs (  all labs ordered are listed, but only abnormal results are displayed) Labs Reviewed  I-STAT CHEM 8, ED - Abnormal; Notable for the following:       Result Value   Sodium 133 (*)    Chloride 98 (*)    Glucose, Bld 106 (*)    Calcium, Ion 1.11 (*)    Hemoglobin 10.2 (*)    HCT 30.0 (*)    All other components within normal limits  I-STAT TROPOININ, ED    EKG  EKG Interpretation  Date/Time:  Saturday Feb 11 2017 22:59:58 EDT Ventricular Rate:  94 PR Interval:    QRS Duration: 63 QT Interval:  439 QTC Calculation: 549 R Axis:   100 Text Interpretation:  Atrial fibrillation Ventricular premature complex Anterior infarct, age indeterminate Prolonged QT interval No significant change since last tracing Confirmed by Maryan Rued  MD, Loree Fee ([id number]) on 02/11/2017 11:36:57 PM       Radiology Ct Head Wo Contrast  Result Date: 02/11/2017 CLINICAL DATA:  Patient fell Wednesday and hit the right side of her head and face on curb. Swelling and bruising on right side around orbit and frontal region. Patient has previous history of loss of disk height in her cervical spine EXAM: CT HEAD WITHOUT CONTRAST CT MAXILLOFACIAL WITHOUT CONTRAST CT CERVICAL SPINE WITHOUT CONTRAST TECHNIQUE: Multidetector CT imaging of the head, cervical spine, and maxillofacial structures were performed using the standard protocol without intravenous  contrast. Multiplanar CT image reconstructions of the cervical spine and maxillofacial structures were also generated. COMPARISON:  Head CT dated 02/10/2015. MRI of the cervical spine dated 07/20/2016. FINDINGS: CT HEAD FINDINGS Brain: There is mild generalized parenchymal atrophy with commensurate dilatation of the ventricles and sulci. Mild chronic small vessel ischemic changes noted within the periventricular and subcortical white matter. There is no mass, hemorrhage, edema or other evidence of acute parenchymal abnormality. No extra-axial hemorrhage. Vascular: There are chronic calcified atherosclerotic changes of the large vessels at the skull base. No unexpected hyperdense vessel. Skull: Chronic-appearing deformity of the right zygoma and right lateral orbital wall. No acute appearing fracture or displacement. Other: Soft tissue hematoma overlying the right frontal bone, measuring approximately 1.7 x 1 cm. No underlying fracture. CT MAXILLOFACIAL FINDINGS Osseous: Lower frontal bones are intact and normally aligned. Old mild deformity of the right lateral orbital wall, stable compared to the earlier head CT from 2016. Osseous structures about the orbits are otherwise intact and normally aligned bilaterally. No displaced nasal bone fracture. Old healed fracture of the right zygoma. Left zygoma is intact and normally aligned. Bilateral pterygoid plates appear intact and normally aligned. Walls of the maxillary sinuses are intact and normally aligned bilaterally. Temporal bones appear intact and normally aligned. Orbits: Negative. No traumatic or inflammatory finding. Sinuses: Clear. Soft tissues: Unremarkable. CT CERVICAL SPINE FINDINGS Alignment: There is reversal of the normal cervical spine lordosis, likely related to the underlying degenerative changes, stable compared to the earlier MRI given patient positioning. No evidence of acute vertebral body subluxation. Skull base and vertebrae: No fracture line or  displaced fracture fragment identified. Soft tissues and spinal canal: No prevertebral fluid or swelling. No visible canal hematoma. Disc levels: Degenerative changes throughout the cervical spine, with associated disc space narrowings and osseous spurring. Most significant degenerative change at the C5-6 and C6-7 levels with complete loss of disc space height and associated osseous spurring, likely source for the overlying reversal of normal cervical spine lordosis. There is, however, no more than mild central canal stenosis at  any level. Degenerative hypertrophy of the uncovertebral and facet joints causing severe bilateral neural foramen stenoses at C3-4, severe bilateral neural foramen stenoses at C4-5, severe right neural foramen stenosis at C5-6 and moderate to severe bilateral neural foramen stenoses at C6-7. Suspect associated nerve root impingement at 1 or more levels. Upper chest: Mass versus consolidation at the left lung apex, incompletely imaged at the lower aspects of the chest CT. Other: Carotid atherosclerosis. IMPRESSION: 1. No acute intracranial abnormality. No intracranial mass, hemorrhage or edema. Chronic small vessel ischemic changes in the white matter. 2. Focal soft tissue hematoma overlying the right frontal bone. No underlying fracture. 3. No acute facial bone fracture or displacement. Old healed fractures of the right zygoma and right lateral orbital wall. 4. No fracture or acute subluxation within the cervical spine. Degenerative changes of the cervical spine, at least moderate in degree, as detailed above. 5. Masslike density at the left lung apex, incompletely imaged at the lower aspects of this chest CT. This likely represents the upper aspect of the ectatic thoracic aortic arch, but would consider chest CT to confirm benignity. These results were called by telephone at the time of interpretation on 02/11/2017 at 6:15 pm to Dr. Maryan Rued, who verbally acknowledged these results.  Electronically Signed   By: Franki Cabot M.D.   On: 02/11/2017 18:18   Ct Cervical Spine Wo Contrast  Result Date: 02/11/2017 CLINICAL DATA:  Patient fell Wednesday and hit the right side of her head and face on curb. Swelling and bruising on right side around orbit and frontal region. Patient has previous history of loss of disk height in her cervical spine EXAM: CT HEAD WITHOUT CONTRAST CT MAXILLOFACIAL WITHOUT CONTRAST CT CERVICAL SPINE WITHOUT CONTRAST TECHNIQUE: Multidetector CT imaging of the head, cervical spine, and maxillofacial structures were performed using the standard protocol without intravenous contrast. Multiplanar CT image reconstructions of the cervical spine and maxillofacial structures were also generated. COMPARISON:  Head CT dated 02/10/2015. MRI of the cervical spine dated 07/20/2016. FINDINGS: CT HEAD FINDINGS Brain: There is mild generalized parenchymal atrophy with commensurate dilatation of the ventricles and sulci. Mild chronic small vessel ischemic changes noted within the periventricular and subcortical white matter. There is no mass, hemorrhage, edema or other evidence of acute parenchymal abnormality. No extra-axial hemorrhage. Vascular: There are chronic calcified atherosclerotic changes of the large vessels at the skull base. No unexpected hyperdense vessel. Skull: Chronic-appearing deformity of the right zygoma and right lateral orbital wall. No acute appearing fracture or displacement. Other: Soft tissue hematoma overlying the right frontal bone, measuring approximately 1.7 x 1 cm. No underlying fracture. CT MAXILLOFACIAL FINDINGS Osseous: Lower frontal bones are intact and normally aligned. Old mild deformity of the right lateral orbital wall, stable compared to the earlier head CT from 2016. Osseous structures about the orbits are otherwise intact and normally aligned bilaterally. No displaced nasal bone fracture. Old healed fracture of the right zygoma. Left zygoma is  intact and normally aligned. Bilateral pterygoid plates appear intact and normally aligned. Walls of the maxillary sinuses are intact and normally aligned bilaterally. Temporal bones appear intact and normally aligned. Orbits: Negative. No traumatic or inflammatory finding. Sinuses: Clear. Soft tissues: Unremarkable. CT CERVICAL SPINE FINDINGS Alignment: There is reversal of the normal cervical spine lordosis, likely related to the underlying degenerative changes, stable compared to the earlier MRI given patient positioning. No evidence of acute vertebral body subluxation. Skull base and vertebrae: No fracture line or displaced fracture fragment identified. Soft tissues  and spinal canal: No prevertebral fluid or swelling. No visible canal hematoma. Disc levels: Degenerative changes throughout the cervical spine, with associated disc space narrowings and osseous spurring. Most significant degenerative change at the C5-6 and C6-7 levels with complete loss of disc space height and associated osseous spurring, likely source for the overlying reversal of normal cervical spine lordosis. There is, however, no more than mild central canal stenosis at any level. Degenerative hypertrophy of the uncovertebral and facet joints causing severe bilateral neural foramen stenoses at C3-4, severe bilateral neural foramen stenoses at C4-5, severe right neural foramen stenosis at C5-6 and moderate to severe bilateral neural foramen stenoses at C6-7. Suspect associated nerve root impingement at 1 or more levels. Upper chest: Mass versus consolidation at the left lung apex, incompletely imaged at the lower aspects of the chest CT. Other: Carotid atherosclerosis. IMPRESSION: 1. No acute intracranial abnormality. No intracranial mass, hemorrhage or edema. Chronic small vessel ischemic changes in the white matter. 2. Focal soft tissue hematoma overlying the right frontal bone. No underlying fracture. 3. No acute facial bone fracture or  displacement. Old healed fractures of the right zygoma and right lateral orbital wall. 4. No fracture or acute subluxation within the cervical spine. Degenerative changes of the cervical spine, at least moderate in degree, as detailed above. 5. Masslike density at the left lung apex, incompletely imaged at the lower aspects of this chest CT. This likely represents the upper aspect of the ectatic thoracic aortic arch, but would consider chest CT to confirm benignity. These results were called by telephone at the time of interpretation on 02/11/2017 at 6:15 pm to Dr. Maryan Rued, who verbally acknowledged these results. Electronically Signed   By: Franki Cabot M.D.   On: 02/11/2017 18:18   Ct Hip Right Wo Contrast  Result Date: 02/11/2017 CLINICAL DATA:  83 year old female with acute right hip pain following fall three days ago. Initial encounter. EXAM: CT OF THE RIGHT HIP WITHOUT CONTRAST TECHNIQUE: Multidetector CT imaging of the right hip was performed according to the standard protocol. Multiplanar CT image reconstructions were also generated. COMPARISON:  07/10/2006 renogram and 06/06/2006 chest CT. FINDINGS: Bones/Joint/Cartilage Nondisplaced fractures of the superior and inferior right pubic rami are noted. No other fracture, subluxation or dislocation identified. Ligaments Suboptimally assessed by CT. Soft tissues Again noted is chronic right UPJ obstruction with moderate to severe hydronephrosis. IMPRESSION: Nondisplaced fractures of the superior and inferior right pubic rami. 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 Electronically Signed   By: Margarette Canada M.D.   On: 02/11/2017 18:36   Ct Maxillofacial Wo Contrast  Result Date: 02/11/2017 CLINICAL DATA:  Patient fell Wednesday and hit the right side of her head and face on curb. Swelling and bruising on right side around orbit and frontal region. Patient has previous history of loss of disk height in her cervical spine EXAM:  CT HEAD WITHOUT CONTRAST CT MAXILLOFACIAL WITHOUT CONTRAST CT CERVICAL SPINE WITHOUT CONTRAST TECHNIQUE: Multidetector CT imaging of the head, cervical spine, and maxillofacial structures were performed using the standard protocol without intravenous contrast. Multiplanar CT image reconstructions of the cervical spine and maxillofacial structures were also generated. COMPARISON:  Head CT dated 02/10/2015. MRI of the cervical spine dated 07/20/2016. FINDINGS: CT HEAD FINDINGS Brain: There is mild generalized parenchymal atrophy with commensurate dilatation of the ventricles and sulci. Mild chronic small vessel ischemic changes noted within the periventricular and subcortical white matter. There is no mass, hemorrhage, edema or other evidence of acute  parenchymal abnormality. No extra-axial hemorrhage. Vascular: There are chronic calcified atherosclerotic changes of the large vessels at the skull base. No unexpected hyperdense vessel. Skull: Chronic-appearing deformity of the right zygoma and right lateral orbital wall. No acute appearing fracture or displacement. Other: Soft tissue hematoma overlying the right frontal bone, measuring approximately 1.7 x 1 cm. No underlying fracture. CT MAXILLOFACIAL FINDINGS Osseous: Lower frontal bones are intact and normally aligned. Old mild deformity of the right lateral orbital wall, stable compared to the earlier head CT from 2016. Osseous structures about the orbits are otherwise intact and normally aligned bilaterally. No displaced nasal bone fracture. Old healed fracture of the right zygoma. Left zygoma is intact and normally aligned. Bilateral pterygoid plates appear intact and normally aligned. Walls of the maxillary sinuses are intact and normally aligned bilaterally. Temporal bones appear intact and normally aligned. Orbits: Negative. No traumatic or inflammatory finding. Sinuses: Clear. Soft tissues: Unremarkable. CT CERVICAL SPINE FINDINGS Alignment: There is reversal  of the normal cervical spine lordosis, likely related to the underlying degenerative changes, stable compared to the earlier MRI given patient positioning. No evidence of acute vertebral body subluxation. Skull base and vertebrae: No fracture line or displaced fracture fragment identified. Soft tissues and spinal canal: No prevertebral fluid or swelling. No visible canal hematoma. Disc levels: Degenerative changes throughout the cervical spine, with associated disc space narrowings and osseous spurring. Most significant degenerative change at the C5-6 and C6-7 levels with complete loss of disc space height and associated osseous spurring, likely source for the overlying reversal of normal cervical spine lordosis. There is, however, no more than mild central canal stenosis at any level. Degenerative hypertrophy of the uncovertebral and facet joints causing severe bilateral neural foramen stenoses at C3-4, severe bilateral neural foramen stenoses at C4-5, severe right neural foramen stenosis at C5-6 and moderate to severe bilateral neural foramen stenoses at C6-7. Suspect associated nerve root impingement at 1 or more levels. Upper chest: Mass versus consolidation at the left lung apex, incompletely imaged at the lower aspects of the chest CT. Other: Carotid atherosclerosis. IMPRESSION: 1. No acute intracranial abnormality. No intracranial mass, hemorrhage or edema. Chronic small vessel ischemic changes in the white matter. 2. Focal soft tissue hematoma overlying the right frontal bone. No underlying fracture. 3. No acute facial bone fracture or displacement. Old healed fractures of the right zygoma and right lateral orbital wall. 4. No fracture or acute subluxation within the cervical spine. Degenerative changes of the cervical spine, at least moderate in degree, as detailed above. 5. Masslike density at the left lung apex, incompletely imaged at the lower aspects of this chest CT. This likely represents the upper  aspect of the ectatic thoracic aortic arch, but would consider chest CT to confirm benignity. These results were called by telephone at the time of interpretation on 02/11/2017 at 6:15 pm to Dr. Maryan Rued, who verbally acknowledged these results. Electronically Signed   By: Franki Cabot M.D.   On: 02/11/2017 18:18    Procedures Procedures (including critical care time)  Medications Ordered in ED Medications - No data to display   Initial Impression / Assessment and Plan / ED Course  I have reviewed the triage vital signs and the nursing notes.  Pertinent labs & imaging results that were available during my care of the patient were reviewed by me and considered in my medical decision making (see chart for details).     Patient with a mechanical fall here for further evaluation due to  ongoing right hip pain and facial bruising and swelling. CT of the head neck and face without acute evidence of trauma. Neurologically she is intact. On CT of the C-spine concern for mass in the lungs and recommended a CT with contrast of the chest. We'll function is within normal limits and will further evaluate that. Hip CT shows inferior and superior pubic rami fracture. This is most likely the reason for the patient's pain with ambulation. She does take hydrocodone twice a day under pain management and that has been fairly controlling her pain but she does have to use a walker. She does not feel that she needs any more pain medication at this time but if does need more pain medication and she will call her pain management doctors for recommendations. Will refer her back to orthopedics but at this time she is weightbearing as tolerated.  CT of chest showed a type A ascending aortic aneurysm with hemopericardium. This most likely is not related to her trauma but may have been progressive over the last month since she was seen for chest pain. Patient is anticoagulated but has not taken her evening dose of medication.  At this time she does not warrant reversal. Dr. Roxy Manns with CT surgery has evaluated the patient and will be admitting her to the ICU. Cardiology also following.  CRITICAL CARE Performed by: Blanchie Dessert Total critical care time: 30 minutes Critical care time was exclusive of separately billable procedures and treating other patients. Critical care was necessary to treat or prevent imminent or life-threatening deterioration. Critical care was time spent personally by me on the following activities: development of treatment plan with patient and/or surrogate as well as nursing, discussions with consultants, evaluation of patient's response to treatment, examination of patient, obtaining history from patient or surrogate, ordering and performing treatments and interventions, ordering and review of laboratory studies, ordering and review of radiographic studies, pulse oximetry and re-evaluation of patient's condition.   Final Clinical Impressions(s) / ED Diagnoses   Final diagnoses:  Fall  Ascending aortic dissection (HCC)  Closed fracture of multiple rami of right pubis, initial encounter [facility])    New Prescriptions New Prescriptions   No medications on file     Blanchie Dessert, MD 02/12/17 0028

## 2017-02-11 NOTE — ED Notes (Signed)
Dr. Maryan Rued at bedside to update pt and family on CT results

## 2017-02-11 NOTE — ED Triage Notes (Signed)
Pt here from home, fell on Wednesday while walking into podiatrists office, bruising to face, forehead, abrasion to right shoulder, c/o hip and pelvis pain when standing-- is having to walk with walker since, normally ambulatory without any difficulty.  PT IS ON ELAQUIS.

## 2017-02-11 NOTE — ED Notes (Signed)
Patient transported to CT 

## 2017-02-11 NOTE — ED Notes (Signed)
ED Provider at bedside. 

## 2017-02-12 ENCOUNTER — Emergency Department (HOSPITAL_COMMUNITY): Payer: Medicare Other

## 2017-02-12 ENCOUNTER — Encounter (HOSPITAL_COMMUNITY): Payer: Self-pay | Admitting: Thoracic Surgery (Cardiothoracic Vascular Surgery)

## 2017-02-12 DIAGNOSIS — Z4682 Encounter for fitting and adjustment of non-vascular catheter: Secondary | ICD-10-CM | POA: Diagnosis not present

## 2017-02-12 DIAGNOSIS — E871 Hypo-osmolality and hyponatremia: Secondary | ICD-10-CM | POA: Diagnosis present

## 2017-02-12 DIAGNOSIS — S32591A Other specified fracture of right pubis, initial encounter for closed fracture: Secondary | ICD-10-CM | POA: Diagnosis not present

## 2017-02-12 DIAGNOSIS — I214 Non-ST elevation (NSTEMI) myocardial infarction: Secondary | ICD-10-CM | POA: Diagnosis not present

## 2017-02-12 DIAGNOSIS — I482 Chronic atrial fibrillation: Secondary | ICD-10-CM | POA: Diagnosis present

## 2017-02-12 DIAGNOSIS — Z90710 Acquired absence of both cervix and uterus: Secondary | ICD-10-CM | POA: Diagnosis not present

## 2017-02-12 DIAGNOSIS — D72828 Other elevated white blood cell count: Secondary | ICD-10-CM | POA: Diagnosis not present

## 2017-02-12 DIAGNOSIS — W01198A Fall on same level from slipping, tripping and stumbling with subsequent striking against other object, initial encounter: Secondary | ICD-10-CM | POA: Diagnosis present

## 2017-02-12 DIAGNOSIS — Z66 Do not resuscitate: Secondary | ICD-10-CM | POA: Diagnosis present

## 2017-02-12 DIAGNOSIS — Z791 Long term (current) use of non-steroidal anti-inflammatories (NSAID): Secondary | ICD-10-CM | POA: Diagnosis not present

## 2017-02-12 DIAGNOSIS — Z515 Encounter for palliative care: Secondary | ICD-10-CM | POA: Diagnosis present

## 2017-02-12 DIAGNOSIS — G8929 Other chronic pain: Secondary | ICD-10-CM | POA: Diagnosis present

## 2017-02-12 DIAGNOSIS — W19XXXA Unspecified fall, initial encounter: Secondary | ICD-10-CM | POA: Insufficient documentation

## 2017-02-12 DIAGNOSIS — D62 Acute posthemorrhagic anemia: Secondary | ICD-10-CM | POA: Diagnosis not present

## 2017-02-12 DIAGNOSIS — I110 Hypertensive heart disease with heart failure: Secondary | ICD-10-CM | POA: Diagnosis present

## 2017-02-12 DIAGNOSIS — Y92481 Parking lot as the place of occurrence of the external cause: Secondary | ICD-10-CM | POA: Diagnosis not present

## 2017-02-12 DIAGNOSIS — I083 Combined rheumatic disorders of mitral, aortic and tricuspid valves: Secondary | ICD-10-CM | POA: Diagnosis not present

## 2017-02-12 DIAGNOSIS — I2510 Atherosclerotic heart disease of native coronary artery without angina pectoris: Secondary | ICD-10-CM | POA: Diagnosis not present

## 2017-02-12 DIAGNOSIS — S32599A Other specified fracture of unspecified pubis, initial encounter for closed fracture: Secondary | ICD-10-CM | POA: Insufficient documentation

## 2017-02-12 DIAGNOSIS — I10 Essential (primary) hypertension: Secondary | ICD-10-CM | POA: Diagnosis not present

## 2017-02-12 DIAGNOSIS — Z79899 Other long term (current) drug therapy: Secondary | ICD-10-CM | POA: Diagnosis not present

## 2017-02-12 DIAGNOSIS — K219 Gastro-esophageal reflux disease without esophagitis: Secondary | ICD-10-CM | POA: Diagnosis not present

## 2017-02-12 DIAGNOSIS — Z952 Presence of prosthetic heart valve: Secondary | ICD-10-CM | POA: Diagnosis not present

## 2017-02-12 DIAGNOSIS — I469 Cardiac arrest, cause unspecified: Secondary | ICD-10-CM | POA: Diagnosis present

## 2017-02-12 DIAGNOSIS — I7101 Dissection of thoracic aorta: Principal | ICD-10-CM

## 2017-02-12 DIAGNOSIS — Z7982 Long term (current) use of aspirin: Secondary | ICD-10-CM | POA: Diagnosis not present

## 2017-02-12 DIAGNOSIS — I252 Old myocardial infarction: Secondary | ICD-10-CM | POA: Diagnosis not present

## 2017-02-12 DIAGNOSIS — I313 Pericardial effusion (noninflammatory): Secondary | ICD-10-CM | POA: Diagnosis not present

## 2017-02-12 DIAGNOSIS — M1990 Unspecified osteoarthritis, unspecified site: Secondary | ICD-10-CM | POA: Insufficient documentation

## 2017-02-12 DIAGNOSIS — N130 Hydronephrosis with ureteropelvic junction obstruction: Secondary | ICD-10-CM | POA: Diagnosis not present

## 2017-02-12 DIAGNOSIS — Z9889 Other specified postprocedural states: Secondary | ICD-10-CM | POA: Diagnosis not present

## 2017-02-12 DIAGNOSIS — R079 Chest pain, unspecified: Secondary | ICD-10-CM | POA: Diagnosis not present

## 2017-02-12 DIAGNOSIS — E039 Hypothyroidism, unspecified: Secondary | ICD-10-CM | POA: Diagnosis present

## 2017-02-12 DIAGNOSIS — R296 Repeated falls: Secondary | ICD-10-CM | POA: Diagnosis present

## 2017-02-12 DIAGNOSIS — M5090 Cervical disc disorder, unspecified, unspecified cervical region: Secondary | ICD-10-CM | POA: Insufficient documentation

## 2017-02-12 DIAGNOSIS — D649 Anemia, unspecified: Secondary | ICD-10-CM | POA: Diagnosis not present

## 2017-02-12 DIAGNOSIS — S329XXS Fracture of unspecified parts of lumbosacral spine and pelvis, sequela: Secondary | ICD-10-CM | POA: Diagnosis not present

## 2017-02-12 DIAGNOSIS — Z01810 Encounter for preprocedural cardiovascular examination: Secondary | ICD-10-CM | POA: Diagnosis not present

## 2017-02-12 DIAGNOSIS — I5032 Chronic diastolic (congestive) heart failure: Secondary | ICD-10-CM | POA: Diagnosis not present

## 2017-02-12 DIAGNOSIS — R5381 Other malaise: Secondary | ICD-10-CM | POA: Diagnosis not present

## 2017-02-12 DIAGNOSIS — G894 Chronic pain syndrome: Secondary | ICD-10-CM | POA: Diagnosis not present

## 2017-02-12 DIAGNOSIS — I351 Nonrheumatic aortic (valve) insufficiency: Secondary | ICD-10-CM | POA: Diagnosis present

## 2017-02-12 DIAGNOSIS — S0083XA Contusion of other part of head, initial encounter: Secondary | ICD-10-CM | POA: Diagnosis present

## 2017-02-12 DIAGNOSIS — I5031 Acute diastolic (congestive) heart failure: Secondary | ICD-10-CM | POA: Diagnosis not present

## 2017-02-12 DIAGNOSIS — I712 Thoracic aortic aneurysm, without rupture: Secondary | ICD-10-CM | POA: Diagnosis not present

## 2017-02-12 DIAGNOSIS — Z87891 Personal history of nicotine dependence: Secondary | ICD-10-CM | POA: Diagnosis not present

## 2017-02-12 DIAGNOSIS — Z951 Presence of aortocoronary bypass graft: Secondary | ICD-10-CM | POA: Diagnosis not present

## 2017-02-12 DIAGNOSIS — Z452 Encounter for adjustment and management of vascular access device: Secondary | ICD-10-CM | POA: Diagnosis not present

## 2017-02-12 DIAGNOSIS — N1330 Unspecified hydronephrosis: Secondary | ICD-10-CM | POA: Insufficient documentation

## 2017-02-12 DIAGNOSIS — R0602 Shortness of breath: Secondary | ICD-10-CM | POA: Diagnosis not present

## 2017-02-12 DIAGNOSIS — G9340 Encephalopathy, unspecified: Secondary | ICD-10-CM | POA: Diagnosis not present

## 2017-02-12 DIAGNOSIS — Z7901 Long term (current) use of anticoagulants: Secondary | ICD-10-CM | POA: Diagnosis not present

## 2017-02-12 DIAGNOSIS — D508 Other iron deficiency anemias: Secondary | ICD-10-CM | POA: Diagnosis not present

## 2017-02-12 DIAGNOSIS — J9811 Atelectasis: Secondary | ICD-10-CM | POA: Diagnosis not present

## 2017-02-12 DIAGNOSIS — I38 Endocarditis, valve unspecified: Secondary | ICD-10-CM | POA: Diagnosis not present

## 2017-02-12 DIAGNOSIS — D6959 Other secondary thrombocytopenia: Secondary | ICD-10-CM | POA: Diagnosis present

## 2017-02-12 DIAGNOSIS — I5033 Acute on chronic diastolic (congestive) heart failure: Secondary | ICD-10-CM | POA: Diagnosis not present

## 2017-02-12 DIAGNOSIS — I481 Persistent atrial fibrillation: Secondary | ICD-10-CM | POA: Diagnosis not present

## 2017-02-12 DIAGNOSIS — D72829 Elevated white blood cell count, unspecified: Secondary | ICD-10-CM | POA: Diagnosis not present

## 2017-02-12 DIAGNOSIS — T884XXA Failed or difficult intubation, initial encounter: Secondary | ICD-10-CM | POA: Diagnosis not present

## 2017-02-12 DIAGNOSIS — D689 Coagulation defect, unspecified: Secondary | ICD-10-CM | POA: Diagnosis not present

## 2017-02-12 DIAGNOSIS — S3282XS Multiple fractures of pelvis without disruption of pelvic ring, sequela: Secondary | ICD-10-CM | POA: Diagnosis not present

## 2017-02-12 DIAGNOSIS — Z953 Presence of xenogenic heart valve: Secondary | ICD-10-CM | POA: Diagnosis not present

## 2017-02-12 HISTORY — DX: Other specified fracture of right pubis, initial encounter for closed fracture: S32.591A

## 2017-02-12 LAB — BASIC METABOLIC PANEL
ANION GAP: 8 (ref 5–15)
BUN: 14 mg/dL (ref 6–20)
CALCIUM: 8.7 mg/dL — AB (ref 8.9–10.3)
CO2: 25 mmol/L (ref 22–32)
Chloride: 98 mmol/L — ABNORMAL LOW (ref 101–111)
Creatinine, Ser: 0.81 mg/dL (ref 0.44–1.00)
GFR calc Af Amer: >60 mL/min (ref >60)
Glucose, Bld: 113 mg/dL — ABNORMAL HIGH (ref 65–99)
POTASSIUM: 4.1 mmol/L (ref 3.5–5.1)
SODIUM: 131 mmol/L — AB (ref 135–145)

## 2017-02-12 LAB — MRSA PCR SCREENING: MRSA by PCR: NEGATIVE

## 2017-02-12 LAB — URINALYSIS, COMPLETE (UACMP) WITH MICROSCOPIC
Bacteria, UA: NONE SEEN
Bilirubin Urine: NEGATIVE
GLUCOSE, UA: NEGATIVE mg/dL
HGB URINE DIPSTICK: NEGATIVE
Ketones, ur: NEGATIVE mg/dL
Leukocytes, UA: NEGATIVE
NITRITE: NEGATIVE
PH: 6 (ref 5.0–8.0)
Protein, ur: NEGATIVE mg/dL
RBC / HPF: NONE SEEN RBC/hpf (ref 0–5)
SPECIFIC GRAVITY, URINE: 1.024 (ref 1.005–1.030)

## 2017-02-12 LAB — CBC
HEMATOCRIT: 27.1 % — AB (ref 36.0–46.0)
HEMOGLOBIN: 8.9 g/dL — AB (ref 12.0–15.0)
MCH: 31 pg (ref 26.0–34.0)
MCHC: 32.8 g/dL (ref 30.0–36.0)
MCV: 94.4 fL (ref 78.0–100.0)
Platelets: 390 10*3/uL (ref 150–400)
RBC: 2.87 MIL/uL — ABNORMAL LOW (ref 3.87–5.11)
RDW: 15 % (ref 11.5–15.5)
WBC: 10.2 10*3/uL (ref 4.0–10.5)

## 2017-02-12 LAB — ECHOCARDIOGRAM COMPLETE

## 2017-02-12 MED ORDER — DULOXETINE HCL 30 MG PO CPEP
30.0000 mg | ORAL_CAPSULE | Freq: Every day | ORAL | Status: DC
  Administered 2017-02-12 – 2017-02-14 (×3): 30 mg via ORAL
  Filled 2017-02-12 (×3): qty 1

## 2017-02-12 MED ORDER — LEVOTHYROXINE SODIUM 100 MCG PO TABS
100.0000 ug | ORAL_TABLET | Freq: Every day | ORAL | Status: DC
  Administered 2017-02-12 – 2017-02-14 (×3): 100 ug via ORAL
  Filled 2017-02-12 (×3): qty 1

## 2017-02-12 MED ORDER — SODIUM CHLORIDE 0.9% FLUSH
3.0000 mL | Freq: Two times a day (BID) | INTRAVENOUS | Status: DC
  Administered 2017-02-12: 3 mL via INTRAVENOUS
  Administered 2017-02-13: 10 mL via INTRAVENOUS
  Administered 2017-02-13 – 2017-02-14 (×2): 3 mL via INTRAVENOUS
  Administered 2017-02-14: 10 mL via INTRAVENOUS

## 2017-02-12 MED ORDER — PANTOPRAZOLE SODIUM 40 MG PO TBEC
80.0000 mg | DELAYED_RELEASE_TABLET | Freq: Every day | ORAL | Status: DC
  Administered 2017-02-12 – 2017-02-14 (×3): 80 mg via ORAL
  Filled 2017-02-12 (×3): qty 2

## 2017-02-12 MED ORDER — DIGOXIN 125 MCG PO TABS
0.1250 mg | ORAL_TABLET | Freq: Every morning | ORAL | Status: DC
  Administered 2017-02-12 – 2017-02-14 (×3): 0.125 mg via ORAL
  Filled 2017-02-12 (×3): qty 1

## 2017-02-12 MED ORDER — DULOXETINE HCL 60 MG PO CPEP
60.0000 mg | ORAL_CAPSULE | Freq: Every day | ORAL | Status: DC
  Administered 2017-02-12 – 2017-02-14 (×3): 60 mg via ORAL
  Filled 2017-02-12 (×3): qty 1

## 2017-02-12 MED ORDER — OXYCODONE-ACETAMINOPHEN 5-325 MG PO TABS
1.0000 | ORAL_TABLET | Freq: Once | ORAL | Status: AC
  Administered 2017-02-12: 1 via ORAL
  Filled 2017-02-12: qty 1

## 2017-02-12 MED ORDER — LABETALOL HCL 5 MG/ML IV SOLN
0.5000 mg/min | INTRAVENOUS | Status: DC
  Administered 2017-02-12 – 2017-02-14 (×2): 0.5 mg/min via INTRAVENOUS
  Filled 2017-02-12 (×3): qty 100

## 2017-02-12 MED ORDER — SODIUM CHLORIDE 0.9% FLUSH: 10.0000 mL | INTRAVENOUS | Status: DC | PRN

## 2017-02-12 MED ORDER — OXYCODONE-ACETAMINOPHEN 5-325 MG PO TABS
1.0000 | ORAL_TABLET | ORAL | Status: DC | PRN
  Administered 2017-02-12 – 2017-02-15 (×12): 1 via ORAL
  Filled 2017-02-12 (×12): qty 1

## 2017-02-12 MED ORDER — ATORVASTATIN CALCIUM 20 MG PO TABS
20.0000 mg | ORAL_TABLET | Freq: Every day | ORAL | Status: DC
  Administered 2017-02-12 – 2017-02-14 (×3): 20 mg via ORAL
  Filled 2017-02-12 (×3): qty 1

## 2017-02-12 MED ORDER — SODIUM CHLORIDE 0.9 % IV SOLN
INTRAVENOUS | Status: DC | PRN
  Administered 2017-02-12: 06:00:00 via INTRA_ARTERIAL

## 2017-02-12 MED ORDER — CHLORHEXIDINE GLUCONATE CLOTH 2 % EX PADS: 6.0000 | MEDICATED_PAD | Freq: Every day | CUTANEOUS | Status: DC

## 2017-02-12 MED ORDER — SODIUM CHLORIDE 0.9% FLUSH: 10.0000 mL | Freq: Two times a day (BID) | INTRAVENOUS | Status: DC

## 2017-02-12 NOTE — Progress Notes (Addendum)
[address]       [place] [address]             [phone]        CARDIOTHORACIC SURGERY PROGRESS NOTE  Subjective: Complains of pain in her hip and neck  Objective: Vital signs: BP Readings from Last 1 Encounters:  02/12/17 (!) 85/61   Pulse Readings from Last 1 Encounters:  02/12/17 74   Resp Readings from Last 1 Encounters:  02/12/17 17   Temp Readings from Last 1 Encounters:  02/12/17 98.4 F (36.9 C) (Oral)    Hemodynamics:  Physical Exam:  Rhythm:   Afib  Breath sounds: clear  Heart sounds:  Irregular, no murmur  Incisions:  n/a  Abdomen:  Soft, non-distended, non-tender  Extremities:  Warm, well-perfused, palpable pulses   Intake/Output from previous day: 05/12 0701 - 05/13 0700 In: 40.2 [I.V.:40.2] Out: 250 [Urine:250] Intake/Output this shift: No intake/output data recorded.  Lab Results:  CBC: Recent Labs  02/11/17 1931 02/12/17 0606  WBC  --  10.2  HGB 10.2* 8.9*  HCT 30.0* 27.1*  PLT  --  390    BMET:  Recent Labs  02/11/17 1931 02/12/17 0606  NA 133* 131*  K 5.0 4.1  CL 98* 98*  CO2  --  25  GLUCOSE 106* 113*  BUN 20 14  CREATININE 0.80 0.81  CALCIUM  --  8.7*     PT/INR:  No results for input(s): LABPROT, INR in the last 72 hours.  CBG (last 3)  No results for input(s): GLUCAP in the last 72 hours.  ABG    Component Value Date/Time   TCO2 26 02/11/2017 1931    CXR: n/a  Assessment/Plan:   The patient remains stable and asymptomatic with respect to her aortic dissection, with blood pressure that has been easily brought under control and no signs of congestive heart failure.  I have personally reviewed the transthoracic echocardiogram performed earlier this morning.  The ascending aorta is exactly the same diameter as it was one month ago.  The aortic dissection is easily seen on her recent exam.  There is mild to moderate central aortic regurgitation and normal LV systolic function.  There  is a moderate sized free flowing pericardial effusion without any signs of developing tamponade.  I suspect the effusion is more reactive and does not represent contained hemopericardium.  I spent in excess of 30 minutes again reviewing the patient's situation with the patient and her family at the bedside this morning.  We discussed options including high risk surgical repair versus continued medical therapy.  We discussed the natural history of aortic dissection involving the ascending aorta, and the fact that the presence of a pericardial effusion is somewhat concerning.  We discussed the risks associated with surgery at length including expectations for her convalescence both with and without any significant complications.  We discussed the timing of surgical intervention and the impact of anticoagulation using Eliquis on this type of a surgical procedure.  The patient is currently interested in proceeding with surgery in a few days once the effects of Eliquis have likely completely dissipated.  We tentatively plan for surgery on Wednesday 5/16.  In the meanwhile she needs to be evaluated by Orthopedic Surgery with regards to her pubic ramus fracture so that we know what physical limitations she will have during her recovery.  I favor getting her up and out of bed as soon as possible.  The  patient and her family understand that there is a chance that her condition might deteriorate suddenly between now and Wednesday, and as such she might not be considered a candidate for surgery in the setting of cardiogenic shock and/or circulatory collapse.   I spent in excess of 30 minutes during the conduct of this hospital encounter and >50% of this time involved direct face-to-face encounter with the patient for counseling and/or coordination of their care.    [name], MD 02/12/2017 8:49 AM

## 2017-02-12 NOTE — Consult Note (Signed)
R rami fractures on CT.   Tentative plan is for non-operativ e management, WBAT, mobilize with PT when appropriate. No orthopedic indication for anticoagulation so would defer to primary team   Formal consult to follow   Jalan Fariss D

## 2017-02-12 NOTE — Consult Note (Signed)
ORTHOPAEDIC CONSULTATION  REQUESTING PHYSICIAN: Bensimhon, Shaune Pascal, MD  Chief Complaint: R pelvic fractures  HPI: Lydia Jordan is a 83 y.o. female who complains of a fall onto the R side. She has been walking but with some groin pain  Past Medical History:  Diagnosis Date  . Acid reflux   . Arthritis   . Ascending aortic aneurysm ([place]) 01/11/2017  . Ascending aortic dissection ([place]) 02/11/2017  . CAD (coronary artery disease)    Cath 01/11/17 showed 50% ost RCA and 50% mLAD --> medical therapy   . Cervical disc disease   . Chronic pain    Secondary to degenerative disc disease and arthritis  . Degenerative arthritis   . Hydronephrosis of right kidney   . Hypertension   . Hypothyroidism   . Hypothyroidism   . Longstanding persistent atrial fibrillation ([place])    originally diagnosed 2007  . NSTEMI (non-ST elevated myocardial infarction) ([place]) 01/10/2017  . Pubic ramus fracture, right, closed, initial encounter ([place]) 02/12/2017   Past Surgical History:  Procedure Laterality Date  . ESOPHAGEAL MANOMETRY N/A 07/31/2015   Procedure: ESOPHAGEAL MANOMETRY (EM);  Surgeon: Arta Silence, MD;  Location: [facility];  Service: Endoscopy;  Laterality: N/A;  . ESOPHAGOGASTRODUODENOSCOPY (EGD) WITH PROPOFOL N/A 09/02/2015   Procedure: ESOPHAGOGASTRODUODENOSCOPY (EGD) WITH PROPOFOL;  Surgeon: Arta Silence, MD;  Location: [facility];  Service: Endoscopy;  Laterality: N/A;  botulinum toxin injection (100 Units; 25 units in 4 quadrants 1-2 cm proximal to GE junction  . LEFT HEART CATH AND CORONARY ANGIOGRAPHY N/A 01/11/2017   Procedure: Left Heart Cath and Coronary Angiography;  Surgeon: Leonie Man, MD;  Location: [facility];  Service: Cardiovascular;  Laterality: N/A;  . skin cancer area removed      right leg healing well  . VAGINAL HYSTERECTOMY     Social History   Social History  . Marital status: Divorced    Spouse name: N/A  . Number of children: 1  .  Years of education: N/A   Occupational History  . retired    Social History Main Topics  . Smoking status: Former Research scientist (life sciences)  . Smokeless tobacco: Never Used     Comment: Former occasional smoker x 3 yrs.  . Alcohol use No  . Drug use: No  . Sexual activity: Not Asked   Other Topics Concern  . None   Social History Narrative   Lives alone but in house connected to her daughter's house   Limited mobility due to severe degenerative arthritis   Family History  Problem Relation Age of Onset  . Hypertension Mother   . Anuerysm Mother        died in her 90's  . Arthritis Father        died at age 63  . Heart attack Brother        died in 72's   Allergies  Allergen Reactions  . Demerol [Meperidine] Anaphylaxis    Reaction: "Heart stopped"   Prior to Admission medications   Medication Sig Start Date End Date Taking? Authorizing Provider  apixaban (ELIQUIS) 5 MG TABS tablet Take 1 tablet (5 mg total) by mouth 2 (two) times daily. 09/04/15  Yes Arta Silence, MD  atorvastatin (LIPITOR) 20 MG tablet Take 1 tablet (20 mg total) by mouth daily. 01/12/17  Yes Carlota Raspberry, Tiffany, PA-C  diclofenac (VOLTAREN) 50 MG EC tablet Take 1 tablet (50 mg total) by mouth 2 (two) times daily. 01/26/17  Yes Bhagat, Crista Luria, PA  digoxin (LANOXIN) 0.125 MG tablet Take 0.125 mg by mouth every morning.   Yes [provider]  DULoxetine (CYMBALTA) 30 MG capsule Take 30 mg by mouth at bedtime.    Yes [provider]  DULoxetine (CYMBALTA) 60 MG capsule Take 60 mg by mouth daily. 01/02/17  Yes [provider]  HYDROcodone-acetaminophen (NORCO/VICODIN) 5-325 MG tablet Take 1 tablet by mouth 2 (two) times daily. Pain   Yes [provider]  Liniments (SALONPAS PAIN RELIEF PATCH EX) Apply 1 patch topically daily as needed. For muscle pain   Yes [provider]  nebivolol (BYSTOLIC) 5 MG tablet Take 5 mg by mouth every morning.   Yes [provider]  nitroGLYCERIN  (NITROSTAT) 0.4 MG SL tablet Place 1 tablet (0.4 mg total) under the tongue every 5 (five) minutes x 3 doses as needed for chest pain. 01/12/17  Yes Carlota Raspberry, Tiffany, PA-C  omeprazole (PRILOSEC) 40 MG capsule Take 40 mg by mouth at bedtime.   Yes [provider]  SYNTHROID 100 MCG tablet Take 100 mcg by mouth daily before breakfast. 06/26/15  Yes [provider]   No results found.  Positive ROS: All other systems have been reviewed and were otherwise negative with the exception of those mentioned in the HPI and as above.  Labs cbc  Recent Labs  02/13/17 0227 02/15/17 0533  WBC 10.0 8.6  HGB 8.9* 8.8*  HCT 27.3* 27.8*  PLT 440* 414*    Labs inflam No results for input(s): CRP in the last 72 hours.  Invalid input(s): ESR  Labs coag  Recent Labs  02/13/17 0227 02/14/17 1041  INR 1.49 1.37     Recent Labs  02/13/17 0227 02/15/17 0533  NA 132* 131*  K 4.3 4.4  CL 99* 97*  CO2 25 26  GLUCOSE 111* 92  BUN 14 12  CREATININE 0.88 0.76  CALCIUM 8.8* 8.8*    Physical Exam: Vitals:   02/15/17 0600 02/15/17 0700  BP: 122/75 122/82  Pulse: 84 79  Resp: 12 12  Temp:     General: Alert, no acute distress Cardiovascular: No pedal edema Respiratory: No cyanosis, no use of accessory musculature GI: No organomegaly, abdomen is soft and non-tender Skin: No lesions in the area of chief complaint other than those listed below in MSK exam.  Neurologic: Sensation intact distally save for the below mentioned MSK exam Psychiatric: Patient is competent for consent with normal mood and affect Lymphatic: No axillary or cervical lymphadenopathy  MUSCULOSKELETAL:  LLE: compartments soft, NVI Other extremities are atraumatic with painless ROM and NVI.  Assessment: R rami fx's  Plan: WBAT PT dvt px per primarty (no orthopedic contraindication or strong indication for prophylaxis)  F/u with me in 3-4wks   Renette Butters, MD Cell [phone]   02/15/2017 7:42 AM

## 2017-02-12 NOTE — Procedures (Signed)
Arterial Catheter Insertion Procedure Note Lydia Jordan [id number] [birth year]  Procedure: Insertion of Arterial Catheter  Indications: Blood pressure monitoring and Frequent blood sampling  Procedure Details Consent: Risks of procedure as well as the alternatives and risks of each were explained to the (patient/caregiver).  Consent for procedure obtained. Time Out: Verified patient identification, verified procedure, site/side was marked, verified correct patient position, special equipment/implants available, medications/allergies/relevent history reviewed, required imaging and test results available.  Performed  Maximum sterile technique was used including antiseptics, cap, gloves, gown, hand hygiene, mask and sheet. Skin prep: Chlorhexidine; local anesthetic administered 20 gauge catheter was inserted into left radial artery using the Seldinger technique.  Evaluation Blood flow good; BP tracing good. Complications: No apparent complications.   Lydia Jordan 02/12/2017

## 2017-02-12 NOTE — Progress Notes (Signed)
  Echocardiogram 2D Echocardiogram has been performed.  Lydia Jordan 02/12/2017, 1:18 AM

## 2017-02-12 NOTE — Progress Notes (Signed)
[facility] BRIEF SICU PROGRESS NOTE  Stable day.  Currently back on low dose labetalol for hypertension Input from Dr Percell Miller appreciated  Plan: Will ask for PT eval tomorrow  [name], MD 02/12/2017 5:30 PM

## 2017-02-12 NOTE — ED Notes (Signed)
ECHO at bedside.

## 2017-02-12 NOTE — Progress Notes (Signed)
Progress Note  Patient Name: Lydia Jordan Date of Encounter: 02/12/2017  Primary Cardiologist:   Dr. Acie Fredrickson  Subjective   She denies chest pain or SOB.  No distress  Inpatient Medications    Scheduled Meds: . atorvastatin  20 mg Oral Daily  . digoxin  0.125 mg Oral q morning - 10a  . DULoxetine  30 mg Oral QHS  . DULoxetine  60 mg Oral Daily  . levothyroxine  100 mcg Oral QAC breakfast  . pantoprazole  80 mg Oral Daily  . sodium chloride flush  3 mL Intravenous Q12H   Continuous Infusions: . sodium chloride 10 mL/hr at 02/12/17 0700  . labetalol (NORMODYNE) infusion Stopped (02/12/17 0707)   PRN Meds: Place/Maintain arterial line **AND** sodium chloride, oxyCODONE-acetaminophen   Vital Signs    Vitals:   02/12/17 0645 02/12/17 0700 02/12/17 0715 02/12/17 0800  BP:  (!) 85/61    Pulse: 76 (!) 59 74   Resp: 17 16 17    Temp:    98 F (36.7 C)  TempSrc:    Oral  SpO2: 94% 96% 97%   Weight:      Height:        Intake/Output Summary (Last 24 hours) at 02/12/17 0854 Last data filed at 02/12/17 0700  Gross per 24 hour  Intake             40.2 ml  Output              250 ml  Net           -209.8 ml   Filed Weights   02/12/17 0345 02/12/17 0400 02/12/17 0600  Weight: 132 lb 14.4 oz (60.3 kg) 132 lb 14.4 oz (60.3 kg) 134 lb 0.6 oz (60.8 kg)    Telemetry    Atrial fib - Personally Reviewed  ECG    NA - Personally Reviewed  Physical Exam   GEN: No acute distress.   Neck: No  JVD Cardiac: IrregularRR, no murmurs, rubs, or gallops.  Respiratory: Clear  to auscultation bilaterally. GI: Soft, nontender, non-distended  MS: No  edema; No deformity. Neuro:  Nonfocal  Psych: Normal affect   Labs    Chemistry Recent Labs Lab 02/11/17 1931 02/12/17 0606  NA 133* 131*  K 5.0 4.1  CL 98* 98*  CO2  --  25  GLUCOSE 106* 113*  BUN 20 14  CREATININE 0.80 0.81  CALCIUM  --  8.7*  GFRNONAA  --  >60  GFRAA  --  >60  ANIONGAP  --  8      Hematology Recent Labs Lab 02/11/17 1931 02/12/17 0606  WBC  --  10.2  RBC  --  2.87*  HGB 10.2* 8.9*  HCT 30.0* 27.1*  MCV  --  94.4  MCH  --  31.0  MCHC  --  32.8  RDW  --  15.0  PLT  --  390    Cardiac EnzymesNo results for input(s): TROPONINI in the last 168 hours.  Recent Labs Lab 02/11/17 2308  TROPIPOC 0.00     BNPNo results for input(s): BNP, PROBNP in the last 168 hours.   DDimer No results for input(s): DDIMER in the last 168 hours.   Radiology    Ct Head Wo Contrast  Result Date: 02/11/2017 CLINICAL DATA:  Patient fell Wednesday and hit the right side of her head and face on curb. Swelling and bruising on right side around orbit and frontal region. Patient has  previous history of loss of disk height in her cervical spine EXAM: CT HEAD WITHOUT CONTRAST CT MAXILLOFACIAL WITHOUT CONTRAST CT CERVICAL SPINE WITHOUT CONTRAST TECHNIQUE: Multidetector CT imaging of the head, cervical spine, and maxillofacial structures were performed using the standard protocol without intravenous contrast. Multiplanar CT image reconstructions of the cervical spine and maxillofacial structures were also generated. COMPARISON:  Head CT dated 02/10/2015. MRI of the cervical spine dated 07/20/2016. FINDINGS: CT HEAD FINDINGS Brain: There is mild generalized parenchymal atrophy with commensurate dilatation of the ventricles and sulci. Mild chronic small vessel ischemic changes noted within the periventricular and subcortical white matter. There is no mass, hemorrhage, edema or other evidence of acute parenchymal abnormality. No extra-axial hemorrhage. Vascular: There are chronic calcified atherosclerotic changes of the large vessels at the skull base. No unexpected hyperdense vessel. Skull: Chronic-appearing deformity of the right zygoma and right lateral orbital wall. No acute appearing fracture or displacement. Other: Soft tissue hematoma overlying the right frontal bone, measuring approximately  1.7 x 1 cm. No underlying fracture. CT MAXILLOFACIAL FINDINGS Osseous: Lower frontal bones are intact and normally aligned. Old mild deformity of the right lateral orbital wall, stable compared to the earlier head CT from 2016. Osseous structures about the orbits are otherwise intact and normally aligned bilaterally. No displaced nasal bone fracture. Old healed fracture of the right zygoma. Left zygoma is intact and normally aligned. Bilateral pterygoid plates appear intact and normally aligned. Walls of the maxillary sinuses are intact and normally aligned bilaterally. Temporal bones appear intact and normally aligned. Orbits: Negative. No traumatic or inflammatory finding. Sinuses: Clear. Soft tissues: Unremarkable. CT CERVICAL SPINE FINDINGS Alignment: There is reversal of the normal cervical spine lordosis, likely related to the underlying degenerative changes, stable compared to the earlier MRI given patient positioning. No evidence of acute vertebral body subluxation. Skull base and vertebrae: No fracture line or displaced fracture fragment identified. Soft tissues and spinal canal: No prevertebral fluid or swelling. No visible canal hematoma. Disc levels: Degenerative changes throughout the cervical spine, with associated disc space narrowings and osseous spurring. Most significant degenerative change at the C5-6 and C6-7 levels with complete loss of disc space height and associated osseous spurring, likely source for the overlying reversal of normal cervical spine lordosis. There is, however, no more than mild central canal stenosis at any level. Degenerative hypertrophy of the uncovertebral and facet joints causing severe bilateral neural foramen stenoses at C3-4, severe bilateral neural foramen stenoses at C4-5, severe right neural foramen stenosis at C5-6 and moderate to severe bilateral neural foramen stenoses at C6-7. Suspect associated nerve root impingement at 1 or more levels. Upper chest: Mass  versus consolidation at the left lung apex, incompletely imaged at the lower aspects of the chest CT. Other: Carotid atherosclerosis. IMPRESSION: 1. No acute intracranial abnormality. No intracranial mass, hemorrhage or edema. Chronic small vessel ischemic changes in the white matter. 2. Focal soft tissue hematoma overlying the right frontal bone. No underlying fracture. 3. No acute facial bone fracture or displacement. Old healed fractures of the right zygoma and right lateral orbital wall. 4. No fracture or acute subluxation within the cervical spine. Degenerative changes of the cervical spine, at least moderate in degree, as detailed above. 5. Masslike density at the left lung apex, incompletely imaged at the lower aspects of this chest CT. This likely represents the upper aspect of the ectatic thoracic aortic arch, but would consider chest CT to confirm benignity. These results were called by telephone at the time  of interpretation on 02/11/2017 at 6:15 pm to Dr. Maryan Rued, who verbally acknowledged these results. Electronically Signed   By: Franki Cabot M.D.   On: 02/11/2017 18:18   Ct Chest W Contrast  Result Date: 02/11/2017 CLINICAL DATA:  Question of mass or prominent aortic arch at the left lung apex on CT of the cervical spine. Further evaluation requested. EXAM: CT CHEST WITH CONTRAST TECHNIQUE: Multidetector CT imaging of the chest was performed during intravenous contrast administration. 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 The dissection extends along the ascending thoracic aorta, aortic arch and proximal descending thoracic aorta, and there appears to be partial thrombosis of the  false lumen. An apparent spontaneous fenestration is noted at the level of the proximal aortic arch. The great vessels appear to arise from the true lumen of the thoracic aorta. The dissection appears to resolve at the proximal descending thoracic aorta. This may reflect lower pressure than usual due to the combination of spontaneous fenestration and extravasation into the patient's apparent moderate hemopericardium. Mediastinum/Nodes: A moderate pericardial effusion is noted, with areas of mildly increased attenuation, suspicious for hemopericardium. There also appears to be a trace left-sided pleural effusion, with mildly increased attenuation, concerning for trace left-sided hemothorax. This likely reflects extension of hemorrhage across the aortic adventitia into the pericardial space and left pleural space. No definite acute contrast extravasation is identified. Lungs/Pleura: Minimal bilateral atelectasis is noted. No pneumothorax is seen. As described above, there is suspicion of trace left-sided hemothorax. No masses are seen. Upper Abdomen: The visualized abdominal aorta remains normal in caliber. There is no evidence of aortic dissection along the abdominal aorta at this time. Scattered calcification is seen along the proximal abdominal aorta. The visualized portions of the liver and spleen are grossly unremarkable. The visualized portions of the gallbladder, pancreas, adrenal glands and kidneys are within normal limits. A tiny hiatal hernia is noted. Musculoskeletal: No acute osseous abnormalities are identified. Degenerative joint space narrowing is noted at the right glenohumeral joint, with apparent scattered loose bodies and underlying diffuse joint space calcification. The visualized musculature is unremarkable in appearance. IMPRESSION: 1. 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 The  dissection extends along the ascending thoracic aorta, aortic arch and proximal descending thoracic aorta, with partial thrombosis of the false lumen. Apparent spontaneous fenestration noted at the level of the proximal aortic arch. 2. Moderate pericardial effusion demonstrates areas of mildly increased attenuation, suspicious for hemopericardium. Trace left-sided pleural effusion also demonstrates mildly increased attenuation, concerning for trace left-sided hemothorax. This likely reflects extension of hemorrhage across the aortic adventitia to the pericardial space and left pleural space. No definite acute contrast extravasation seen at this time. 3. The dissection appears to resolve at the proximal descending thoracic aorta, and the distal descending thoracic aorta and abdominal aorta are unremarkable in appearance, aside from scattered aortic atherosclerosis. This may reflect lower pressure than usual due to the combination of spontaneous fenestration and extravasation into the patient's apparent moderate hemopericardium. The great vessels arise from the true lumen. 4. Minimal bilateral atelectasis noted. 5. Degenerative change of the right glenohumeral joint, with scattered loose  bodies and underlying diffuse joint space calcification. Critical Value/emergent results were called by telephone at the time of interpretation on 02/11/2017 at 10:44 pm to Dr. Blanchie Dessert, who verbally acknowledged these results. Electronically Signed   By: Garald Balding M.D.   On: 02/11/2017 22:59   Ct Cervical Spine Wo Contrast  Result Date: 02/11/2017 CLINICAL DATA:  Patient fell Wednesday and hit the right side of her head and face on curb. Swelling and bruising on right side around orbit and frontal region. Patient has previous history of loss of disk height in her cervical spine EXAM: CT HEAD WITHOUT CONTRAST CT MAXILLOFACIAL WITHOUT CONTRAST CT CERVICAL SPINE WITHOUT CONTRAST TECHNIQUE: Multidetector CT imaging of the  head, cervical spine, and maxillofacial structures were performed using the standard protocol without intravenous contrast. Multiplanar CT image reconstructions of the cervical spine and maxillofacial structures were also generated. COMPARISON:  Head CT dated 02/10/2015. MRI of the cervical spine dated 07/20/2016. FINDINGS: CT HEAD FINDINGS Brain: There is mild generalized parenchymal atrophy with commensurate dilatation of the ventricles and sulci. Mild chronic small vessel ischemic changes noted within the periventricular and subcortical white matter. There is no mass, hemorrhage, edema or other evidence of acute parenchymal abnormality. No extra-axial hemorrhage. Vascular: There are chronic calcified atherosclerotic changes of the large vessels at the skull base. No unexpected hyperdense vessel. Skull: Chronic-appearing deformity of the right zygoma and right lateral orbital wall. No acute appearing fracture or displacement. Other: Soft tissue hematoma overlying the right frontal bone, measuring approximately 1.7 x 1 cm. No underlying fracture. CT MAXILLOFACIAL FINDINGS Osseous: Lower frontal bones are intact and normally aligned. Old mild deformity of the right lateral orbital wall, stable compared to the earlier head CT from 2016. Osseous structures about the orbits are otherwise intact and normally aligned bilaterally. No displaced nasal bone fracture. Old healed fracture of the right zygoma. Left zygoma is intact and normally aligned. Bilateral pterygoid plates appear intact and normally aligned. Walls of the maxillary sinuses are intact and normally aligned bilaterally. Temporal bones appear intact and normally aligned. Orbits: Negative. No traumatic or inflammatory finding. Sinuses: Clear. Soft tissues: Unremarkable. CT CERVICAL SPINE FINDINGS Alignment: There is reversal of the normal cervical spine lordosis, likely related to the underlying degenerative changes, stable compared to the earlier MRI given  patient positioning. No evidence of acute vertebral body subluxation. Skull base and vertebrae: No fracture line or displaced fracture fragment identified. Soft tissues and spinal canal: No prevertebral fluid or swelling. No visible canal hematoma. Disc levels: Degenerative changes throughout the cervical spine, with associated disc space narrowings and osseous spurring. Most significant degenerative change at the C5-6 and C6-7 levels with complete loss of disc space height and associated osseous spurring, likely source for the overlying reversal of normal cervical spine lordosis. There is, however, no more than mild central canal stenosis at any level. Degenerative hypertrophy of the uncovertebral and facet joints causing severe bilateral neural foramen stenoses at C3-4, severe bilateral neural foramen stenoses at C4-5, severe right neural foramen stenosis at C5-6 and moderate to severe bilateral neural foramen stenoses at C6-7. Suspect associated nerve root impingement at 1 or more levels. Upper chest: Mass versus consolidation at the left lung apex, incompletely imaged at the lower aspects of the chest CT. Other: Carotid atherosclerosis. IMPRESSION: 1. No acute intracranial abnormality. No intracranial mass, hemorrhage or edema. Chronic small vessel ischemic changes in the white matter. 2. Focal soft tissue hematoma overlying the right frontal bone. No underlying fracture. 3. No  acute facial bone fracture or displacement. Old healed fractures of the right zygoma and right lateral orbital wall. 4. No fracture or acute subluxation within the cervical spine. Degenerative changes of the cervical spine, at least moderate in degree, as detailed above. 5. Masslike density at the left lung apex, incompletely imaged at the lower aspects of this chest CT. This likely represents the upper aspect of the ectatic thoracic aortic arch, but would consider chest CT to confirm benignity. These results were called by telephone at  the time of interpretation on 02/11/2017 at 6:15 pm to Dr. Maryan Rued, who verbally acknowledged these results. Electronically Signed   By: Franki Cabot M.D.   On: 02/11/2017 18:18   Ct Hip Right Wo Contrast  Result Date: 02/11/2017 CLINICAL DATA:  83 year old female with acute right hip pain following fall three days ago. Initial encounter. EXAM: CT OF THE RIGHT HIP WITHOUT CONTRAST TECHNIQUE: Multidetector CT imaging of the right hip was performed according to the standard protocol. Multiplanar CT image reconstructions were also generated. COMPARISON:  07/10/2006 renogram and 06/06/2006 chest CT. FINDINGS: Bones/Joint/Cartilage Nondisplaced fractures of the superior and inferior right pubic rami are noted. No other fracture, subluxation or dislocation identified. Ligaments Suboptimally assessed by CT. Soft tissues Again noted is chronic right UPJ obstruction with moderate to severe hydronephrosis. IMPRESSION: Nondisplaced fractures of the superior and inferior right pubic rami. 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 Electronically Signed   By: Margarette Canada M.D.   On: 02/11/2017 18:36   Ct Maxillofacial Wo Contrast  Result Date: 02/11/2017 CLINICAL DATA:  Patient fell Wednesday and hit the right side of her head and face on curb. Swelling and bruising on right side around orbit and frontal region. Patient has previous history of loss of disk height in her cervical spine EXAM: CT HEAD WITHOUT CONTRAST CT MAXILLOFACIAL WITHOUT CONTRAST CT CERVICAL SPINE WITHOUT CONTRAST TECHNIQUE: Multidetector CT imaging of the head, cervical spine, and maxillofacial structures were performed using the standard protocol without intravenous contrast. Multiplanar CT image reconstructions of the cervical spine and maxillofacial structures were also generated. COMPARISON:  Head CT dated 02/10/2015. MRI of the cervical spine dated 07/20/2016. FINDINGS: CT HEAD FINDINGS Brain: There is mild generalized  parenchymal atrophy with commensurate dilatation of the ventricles and sulci. Mild chronic small vessel ischemic changes noted within the periventricular and subcortical white matter. There is no mass, hemorrhage, edema or other evidence of acute parenchymal abnormality. No extra-axial hemorrhage. Vascular: There are chronic calcified atherosclerotic changes of the large vessels at the skull base. No unexpected hyperdense vessel. Skull: Chronic-appearing deformity of the right zygoma and right lateral orbital wall. No acute appearing fracture or displacement. Other: Soft tissue hematoma overlying the right frontal bone, measuring approximately 1.7 x 1 cm. No underlying fracture. CT MAXILLOFACIAL FINDINGS Osseous: Lower frontal bones are intact and normally aligned. Old mild deformity of the right lateral orbital wall, stable compared to the earlier head CT from 2016. Osseous structures about the orbits are otherwise intact and normally aligned bilaterally. No displaced nasal bone fracture. Old healed fracture of the right zygoma. Left zygoma is intact and normally aligned. Bilateral pterygoid plates appear intact and normally aligned. Walls of the maxillary sinuses are intact and normally aligned bilaterally. Temporal bones appear intact and normally aligned. Orbits: Negative. No traumatic or inflammatory finding. Sinuses: Clear. Soft tissues: Unremarkable. CT CERVICAL SPINE FINDINGS Alignment: There is reversal of the normal cervical spine lordosis, likely related to the underlying degenerative changes, stable compared  to the earlier MRI given patient positioning. No evidence of acute vertebral body subluxation. Skull base and vertebrae: No fracture line or displaced fracture fragment identified. Soft tissues and spinal canal: No prevertebral fluid or swelling. No visible canal hematoma. Disc levels: Degenerative changes throughout the cervical spine, with associated disc space narrowings and osseous spurring. Most  significant degenerative change at the C5-6 and C6-7 levels with complete loss of disc space height and associated osseous spurring, likely source for the overlying reversal of normal cervical spine lordosis. There is, however, no more than mild central canal stenosis at any level. Degenerative hypertrophy of the uncovertebral and facet joints causing severe bilateral neural foramen stenoses at C3-4, severe bilateral neural foramen stenoses at C4-5, severe right neural foramen stenosis at C5-6 and moderate to severe bilateral neural foramen stenoses at C6-7. Suspect associated nerve root impingement at 1 or more levels. Upper chest: Mass versus consolidation at the left lung apex, incompletely imaged at the lower aspects of the chest CT. Other: Carotid atherosclerosis. IMPRESSION: 1. No acute intracranial abnormality. No intracranial mass, hemorrhage or edema. Chronic small vessel ischemic changes in the white matter. 2. Focal soft tissue hematoma overlying the right frontal bone. No underlying fracture. 3. No acute facial bone fracture or displacement. Old healed fractures of the right zygoma and right lateral orbital wall. 4. No fracture or acute subluxation within the cervical spine. Degenerative changes of the cervical spine, at least moderate in degree, as detailed above. 5. Masslike density at the left lung apex, incompletely imaged at the lower aspects of this chest CT. This likely represents the upper aspect of the ectatic thoracic aortic arch, but would consider chest CT to confirm benignity. These results were called by telephone at the time of interpretation on 02/11/2017 at 6:15 pm to Dr. Maryan Rued, who verbally acknowledged these results. Electronically Signed   By: Franki Cabot M.D.   On: 02/11/2017 18:18    Cardiac Studies   Echo:    Study Conclusions  - Left ventricle: The cavity size was normal. Wall thickness was   increased increased in a pattern of mild to moderate LVH.   Systolic  function was normal. The estimated ejection fraction was   in the range of 55% to 60%. - Aortic valve: Mildly calcified annulus. There was mild to   moderate regurgitation. - Aorta: There is a dissection flap with false lumen seen in the   ascending aorta with max dimension of 5.o cm at least. The aorta   was severely abnormal and moderately dilated. - Mitral valve: There was moderate regurgitation. - Left atrium: The atrium was moderately to severely dilated. - Right atrium: The atrium was moderately dilated. - Tricuspid valve: There was mild-moderate regurgitation. - Pulmonary arteries: Systolic pressure was moderately increased.   PA peak pressure: 53 mm Hg (S). - Pericardium, extracardiac: A moderate to large pericardial   effusion was identified circumferential to the heart. Features   were consistent with mild tamponade physiology.   Patient Profile     83 y.o. female 83 yo woman with PMH atrial fibrillation on eliquis, HTN, hypothyroidism, acid reflux, recent NSTEMI seen at the request of Dr. Maryan Rued for type A aortic dissection.   Assessment & Plan    ASCENDING AORTIC DISSECTION:  Images (CT and echo reviewed personally).  Discussed with Dr. Roxy Manns.  Plan for surgery on Wed. Discussed with the family.   PUBIC RAMUS FRACUTRE:  Will consult ortho.  We need to understand how mobile she will  be post op.    ATRIAL FIB:  Eliquis off.  Rate is well controlled  CAD:  Non obstructive.  HTN:  BP controlled on Labetalol.      Signed, Minus Breeding, MD  02/12/2017, 8:54 AM

## 2017-02-12 NOTE — H&P (Signed)
CARDIOLOGY HISTORY & PHYSICAL   Referring Physician: Dr. Maryan Rued, ER Primary Physician: Dr. Laurann Montana Primary Cardiologist: Dr. Acie Fredrickson Reason for Admission: Type A aortic dissection  HPI: [name] is an 83 yo woman with PMH atrial fibrillation on eliquis, HTN, hypothyroidism, acid reflux, recent NSTEMI seen at the request of Dr. Maryan Rued for type A aortic dissection.  Please see extensive note from Dr. Roxy Manns of [facility] regarding evaluation and decision making.  Briefly, patient had a mechanical fall on 5/9, during which time she bruised her face and hip. She had pain after the fall and felt she needed to be seen, therefore presenting 5/12. Given that she is on anticoagulation, she underwent CT of head and pelvis. Initial CT concerning for unclear mass vs. Consolidation at left lung apex, so repeat CT with contrast performed. CT showed type A dissection. CT surgery called, and cardiology consulted.  The patient has been asymptomatic from a cardiac standpoint since last month. She presented in April with a nosebleed, and when questioned during ER visit also noted chest pain that went to her neck and back. Troponins were elevated, consistent with NSTEMI. She had echo and cath at that time (results below), and no obstructive lesion was noted. She overall had done well post discharge. No further chest pain. Has noticed some fatigue and decreased energy but has been functional in her ADLs. No shortness of breath, lies flat comfortably. No other LOC, no focal neuro concerns.  Review of Systems:     Cardiac Review of Systems: {Y] = yes [ ]  = no  Chest Pain [    ]  Resting SOB [   ] Exertional SOB  [  ]  Orthopnea [  ]   Pedal Edema [   ]    Palpitations [  ] Syncope  [  ]   Presyncope [   ]  General Review of Systems: [Y] = yes [  ]=no Constitional: recent weight change [  ]; anorexia [  ]; fatigue [Y  ]; nausea [  ]; night sweats [  ]; fever [  ]; or chills [  ];                                                                      Eyes : blurred vision [  ]; diplopia [   ]; vision changes [  ];  Amaurosis fugax[  ]; Resp: cough [  ];  wheezing[  ];  hemoptysis[  ];  PND [  ];  GI:  gallstones[  ], vomiting[  ];  dysphagia[  ]; melena[  ];  hematochezia [  ]; heartburn[ Y ];   GU: kidney stones [  ]; hematuria[  ];   dysuria [  ];  nocturia[  ]; incontinence [  ];             Skin: rash, swelling[  ];, hair loss[  ];  peripheral edema[  ];  or itching[  ]; Musculosketetal: myalgias[  ];  joint swelling[  ];  joint erythema[  ];  joint pain[ Y ];  back pain[  Y];  Heme/Lymph: bruising[  ];  bleeding[  ];  anemia[  ];  Neuro: TIA[  ];  headaches[  Y];  stroke[  ];  vertigo[  ];  seizures[  ];   paresthesias[  ];  difficulty walking[  ];  Psych:depression[  ]; anxiety[  ];  Endocrine: diabetes[  ];  thyroid dysfunction[  ];  Other:  Past Medical History:  Diagnosis Date  . Acid reflux   . Arthritis   . Ascending aortic aneurysm ([place]) 01/11/2017  . CAD (coronary artery disease)    Cath 01/11/17 showed 50% ost RCA and 50% mLAD --> medical therapy   . Cervical disc disease   . Chronic pain    Secondary to degenerative disc disease and arthritis  . Degenerative arthritis   . Hypertension   . Hypothyroidism   . Longstanding persistent atrial fibrillation ([place])   . NSTEMI (non-ST elevated myocardial infarction) ([place]) 01/10/2017  . Pubic ramus fracture, right, closed, initial encounter ([place]) 02/12/2017  . Thyroid disease      (Not in a hospital admission)  Infusions:  Allergies  Allergen Reactions  . Demerol [Meperidine] Anaphylaxis    Reaction: "Heart stopped"    Social History   Social History  . Marital status: Divorced    Spouse name: N/A  . Number of children: 1  . Years of education: N/A   Occupational History  . retired    Social History Main Topics  . Smoking status: Former Research scientist (life sciences)  . Smokeless tobacco: Never Used     Comment: Former occasional smoker x 3  yrs.  . Alcohol use No  . Drug use: No  . Sexual activity: Not on file   Other Topics Concern  . Not on file   Social History Narrative   Lives alone but in house connected to her daughter's house   Limited mobility due to severe degenerative arthritis    Family History  Problem Relation Age of Onset  . Hypertension Mother   . Anuerysm Mother        died in her 11's  . Arthritis Father        died at age 73  . Heart attack Brother        died in 24's    PHYSICAL EXAM: Vitals:   02/11/17 2330 02/12/17 0030  BP: (!) 160/97 (!) 166/92  Pulse: 92 93  Resp: 20 17  Temp:      No intake or output data in the 24 hours ending 02/12/17 0151  General:  Resting comfortably in bed  HEENT: ecchymoses over R orbital region and R cheek Neck: supple. no JVD. Carotids 2+ bilat; no bruits. No lymphadenopathy or thryomegaly appreciated. Cor: PMI nondisplaced. irregular rate & rhythm. No rubs, gallops or murmurs. Lungs: clear Abdomen: soft, nontender, nondistended. No hepatosplenomegaly. No bruits or masses. Good bowel sounds. Extremities: no cyanosis, clubbing, rash, edema Neuro: alert & oriented x 3, cranial nerves grossly intact. moves all 4 extremities w/o difficulty. Affect pleasant.  ECG:  Results for orders placed or performed during the hospital encounter of 02/11/17 (from the past 24 hour(s))  I-stat chem 8, ed     Status: Abnormal   Collection Time: 02/11/17  7:31 PM  Result Value Ref Range   Sodium 133 (L) 135 - 145 mmol/L   Potassium 5.0 3.5 - 5.1 mmol/L   Chloride 98 (L) 101 - 111 mmol/L   BUN 20 6 - 20 mg/dL   Creatinine, Ser 0.80 0.44 - 1.00 mg/dL   Glucose, Bld 106 (H) 65 - 99 mg/dL   Calcium, Ion 1.11 (L) 1.15 - 1.40 mmol/L  TCO2 26 0 - 100 mmol/L   Hemoglobin 10.2 (L) 12.0 - 15.0 g/dL   HCT 30.0 (L) 36.0 - 46.0 %  I-stat troponin, ED     Status: None   Collection Time: 02/11/17 11:08 PM  Result Value Ref Range   Troponin i, poc 0.00 0.00 - 0.08 ng/mL    Comment 3           Ct Head Wo Contrast  Result Date: 02/11/2017 CLINICAL DATA:  Patient fell Wednesday and hit the right side of her head and face on curb. Swelling and bruising on right side around orbit and frontal region. Patient has previous history of loss of disk height in her cervical spine EXAM: CT HEAD WITHOUT CONTRAST CT MAXILLOFACIAL WITHOUT CONTRAST CT CERVICAL SPINE WITHOUT CONTRAST TECHNIQUE: Multidetector CT imaging of the head, cervical spine, and maxillofacial structures were performed using the standard protocol without intravenous contrast. Multiplanar CT image reconstructions of the cervical spine and maxillofacial structures were also generated. COMPARISON:  Head CT dated 02/10/2015. MRI of the cervical spine dated 07/20/2016. FINDINGS: CT HEAD FINDINGS Brain: There is mild generalized parenchymal atrophy with commensurate dilatation of the ventricles and sulci. Mild chronic small vessel ischemic changes noted within the periventricular and subcortical white matter. There is no mass, hemorrhage, edema or other evidence of acute parenchymal abnormality. No extra-axial hemorrhage. Vascular: There are chronic calcified atherosclerotic changes of the large vessels at the skull base. No unexpected hyperdense vessel. Skull: Chronic-appearing deformity of the right zygoma and right lateral orbital wall. No acute appearing fracture or displacement. Other: Soft tissue hematoma overlying the right frontal bone, measuring approximately 1.7 x 1 cm. No underlying fracture. CT MAXILLOFACIAL FINDINGS Osseous: Lower frontal bones are intact and normally aligned. Old mild deformity of the right lateral orbital wall, stable compared to the earlier head CT from 2016. Osseous structures about the orbits are otherwise intact and normally aligned bilaterally. No displaced nasal bone fracture. Old healed fracture of the right zygoma. Left zygoma is intact and normally aligned. Bilateral pterygoid plates appear  intact and normally aligned. Walls of the maxillary sinuses are intact and normally aligned bilaterally. Temporal bones appear intact and normally aligned. Orbits: Negative. No traumatic or inflammatory finding. Sinuses: Clear. Soft tissues: Unremarkable. CT CERVICAL SPINE FINDINGS Alignment: There is reversal of the normal cervical spine lordosis, likely related to the underlying degenerative changes, stable compared to the earlier MRI given patient positioning. No evidence of acute vertebral body subluxation. Skull base and vertebrae: No fracture line or displaced fracture fragment identified. Soft tissues and spinal canal: No prevertebral fluid or swelling. No visible canal hematoma. Disc levels: Degenerative changes throughout the cervical spine, with associated disc space narrowings and osseous spurring. Most significant degenerative change at the C5-6 and C6-7 levels with complete loss of disc space height and associated osseous spurring, likely source for the overlying reversal of normal cervical spine lordosis. There is, however, no more than mild central canal stenosis at any level. Degenerative hypertrophy of the uncovertebral and facet joints causing severe bilateral neural foramen stenoses at C3-4, severe bilateral neural foramen stenoses at C4-5, severe right neural foramen stenosis at C5-6 and moderate to severe bilateral neural foramen stenoses at C6-7. Suspect associated nerve root impingement at 1 or more levels. Upper chest: Mass versus consolidation at the left lung apex, incompletely imaged at the lower aspects of the chest CT. Other: Carotid atherosclerosis. IMPRESSION: 1. No acute intracranial abnormality. No intracranial mass, hemorrhage or edema. Chronic small vessel ischemic  changes in the white matter. 2. Focal soft tissue hematoma overlying the right frontal bone. No underlying fracture. 3. No acute facial bone fracture or displacement. Old healed fractures of the right zygoma and right  lateral orbital wall. 4. No fracture or acute subluxation within the cervical spine. Degenerative changes of the cervical spine, at least moderate in degree, as detailed above. 5. Masslike density at the left lung apex, incompletely imaged at the lower aspects of this chest CT. This likely represents the upper aspect of the ectatic thoracic aortic arch, but would consider chest CT to confirm benignity. These results were called by telephone at the time of interpretation on 02/11/2017 at 6:15 pm to Dr. Maryan Rued, who verbally acknowledged these results. Electronically Signed   By: Franki Cabot M.D.   On: 02/11/2017 18:18   Ct Chest W Contrast  Result Date: 02/11/2017 CLINICAL DATA:  Question of mass or prominent aortic arch at the left lung apex on CT of the cervical spine. Further evaluation requested. EXAM: CT CHEST WITH CONTRAST TECHNIQUE: Multidetector CT imaging of the chest was performed during intravenous contrast administration. 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 The dissection extends along the ascending thoracic aorta, aortic arch and proximal descending thoracic aorta, and there appears to be partial thrombosis of the false lumen. An apparent spontaneous fenestration is noted at the level of the proximal aortic arch. The great vessels appear to arise from the true lumen of the thoracic aorta. The dissection appears to resolve at the proximal descending thoracic aorta. This may reflect lower pressure than usual due to the combination of spontaneous fenestration and extravasation into the patient's apparent moderate hemopericardium. Mediastinum/Nodes: A moderate pericardial effusion is  noted, with areas of mildly increased attenuation, suspicious for hemopericardium. There also appears to be a trace left-sided pleural effusion, with mildly increased attenuation, concerning for trace left-sided hemothorax. This likely reflects extension of hemorrhage across the aortic adventitia into the pericardial space and left pleural space. No definite acute contrast extravasation is identified. Lungs/Pleura: Minimal bilateral atelectasis is noted. No pneumothorax is seen. As described above, there is suspicion of trace left-sided hemothorax. No masses are seen. Upper Abdomen: The visualized abdominal aorta remains normal in caliber. There is no evidence of aortic dissection along the abdominal aorta at this time. Scattered calcification is seen along the proximal abdominal aorta. The visualized portions of the liver and spleen are grossly unremarkable. The visualized portions of the gallbladder, pancreas, adrenal glands and kidneys are within normal limits. A tiny hiatal hernia is noted. Musculoskeletal: No acute osseous abnormalities are identified. Degenerative joint space narrowing is noted at the right glenohumeral joint, with apparent scattered loose bodies and underlying diffuse joint space calcification. The visualized musculature is unremarkable in appearance. IMPRESSION: 1. 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 The dissection extends along the ascending thoracic aorta, aortic arch and proximal descending thoracic aorta, with partial thrombosis of the false lumen. Apparent spontaneous fenestration noted at the level of the proximal aortic arch. 2. Moderate pericardial effusion demonstrates areas of mildly increased attenuation, suspicious for hemopericardium. Trace left-sided pleural effusion also demonstrates mildly increased  attenuation, concerning for trace left-sided hemothorax. This  likely reflects extension of hemorrhage across the aortic adventitia to the pericardial space and left pleural space. No definite acute contrast extravasation seen at this time. 3. The dissection appears to resolve at the proximal descending thoracic aorta, and the distal descending thoracic aorta and abdominal aorta are unremarkable in appearance, aside from scattered aortic atherosclerosis. This may reflect lower pressure than usual due to the combination of spontaneous fenestration and extravasation into the patient's apparent moderate hemopericardium. The great vessels arise from the true lumen. 4. Minimal bilateral atelectasis noted. 5. Degenerative change of the right glenohumeral joint, with scattered loose bodies and underlying diffuse joint space calcification. Critical Value/emergent results were called by telephone at the time of interpretation on 02/11/2017 at 10:44 pm to Dr. Blanchie Dessert, who verbally acknowledged these results. Electronically Signed   By: Garald Balding M.D.   On: 02/11/2017 22:59   Ct Cervical Spine Wo Contrast  Result Date: 02/11/2017 CLINICAL DATA:  Patient fell Wednesday and hit the right side of her head and face on curb. Swelling and bruising on right side around orbit and frontal region. Patient has previous history of loss of disk height in her cervical spine EXAM: CT HEAD WITHOUT CONTRAST CT MAXILLOFACIAL WITHOUT CONTRAST CT CERVICAL SPINE WITHOUT CONTRAST TECHNIQUE: Multidetector CT imaging of the head, cervical spine, and maxillofacial structures were performed using the standard protocol without intravenous contrast. Multiplanar CT image reconstructions of the cervical spine and maxillofacial structures were also generated. COMPARISON:  Head CT dated 02/10/2015. MRI of the cervical spine dated 07/20/2016. FINDINGS: CT HEAD FINDINGS Brain: There is mild generalized parenchymal atrophy with commensurate dilatation of the ventricles and sulci. Mild chronic small vessel  ischemic changes noted within the periventricular and subcortical white matter. There is no mass, hemorrhage, edema or other evidence of acute parenchymal abnormality. No extra-axial hemorrhage. Vascular: There are chronic calcified atherosclerotic changes of the large vessels at the skull base. No unexpected hyperdense vessel. Skull: Chronic-appearing deformity of the right zygoma and right lateral orbital wall. No acute appearing fracture or displacement. Other: Soft tissue hematoma overlying the right frontal bone, measuring approximately 1.7 x 1 cm. No underlying fracture. CT MAXILLOFACIAL FINDINGS Osseous: Lower frontal bones are intact and normally aligned. Old mild deformity of the right lateral orbital wall, stable compared to the earlier head CT from 2016. Osseous structures about the orbits are otherwise intact and normally aligned bilaterally. No displaced nasal bone fracture. Old healed fracture of the right zygoma. Left zygoma is intact and normally aligned. Bilateral pterygoid plates appear intact and normally aligned. Walls of the maxillary sinuses are intact and normally aligned bilaterally. Temporal bones appear intact and normally aligned. Orbits: Negative. No traumatic or inflammatory finding. Sinuses: Clear. Soft tissues: Unremarkable. CT CERVICAL SPINE FINDINGS Alignment: There is reversal of the normal cervical spine lordosis, likely related to the underlying degenerative changes, stable compared to the earlier MRI given patient positioning. No evidence of acute vertebral body subluxation. Skull base and vertebrae: No fracture line or displaced fracture fragment identified. Soft tissues and spinal canal: No prevertebral fluid or swelling. No visible canal hematoma. Disc levels: Degenerative changes throughout the cervical spine, with associated disc space narrowings and osseous spurring. Most significant degenerative change at the C5-6 and C6-7 levels with complete loss of disc space height and  associated osseous spurring, likely source for the overlying reversal of normal cervical spine lordosis. There is, however, no more than mild central canal stenosis at any  level. Degenerative hypertrophy of the uncovertebral and facet joints causing severe bilateral neural foramen stenoses at C3-4, severe bilateral neural foramen stenoses at C4-5, severe right neural foramen stenosis at C5-6 and moderate to severe bilateral neural foramen stenoses at C6-7. Suspect associated nerve root impingement at 1 or more levels. Upper chest: Mass versus consolidation at the left lung apex, incompletely imaged at the lower aspects of the chest CT. Other: Carotid atherosclerosis. IMPRESSION: 1. No acute intracranial abnormality. No intracranial mass, hemorrhage or edema. Chronic small vessel ischemic changes in the white matter. 2. Focal soft tissue hematoma overlying the right frontal bone. No underlying fracture. 3. No acute facial bone fracture or displacement. Old healed fractures of the right zygoma and right lateral orbital wall. 4. No fracture or acute subluxation within the cervical spine. Degenerative changes of the cervical spine, at least moderate in degree, as detailed above. 5. Masslike density at the left lung apex, incompletely imaged at the lower aspects of this chest CT. This likely represents the upper aspect of the ectatic thoracic aortic arch, but would consider chest CT to confirm benignity. These results were called by telephone at the time of interpretation on 02/11/2017 at 6:15 pm to Dr. Maryan Rued, who verbally acknowledged these results. Electronically Signed   By: Franki Cabot M.D.   On: 02/11/2017 18:18   Ct Hip Right Wo Contrast  Result Date: 02/11/2017 CLINICAL DATA:  83 year old female with acute right hip pain following fall three days ago. Initial encounter. EXAM: CT OF THE RIGHT HIP WITHOUT CONTRAST TECHNIQUE: Multidetector CT imaging of the right hip was performed according to the standard  protocol. Multiplanar CT image reconstructions were also generated. COMPARISON:  07/10/2006 renogram and 06/06/2006 chest CT. FINDINGS: Bones/Joint/Cartilage Nondisplaced fractures of the superior and inferior right pubic rami are noted. No other fracture, subluxation or dislocation identified. Ligaments Suboptimally assessed by CT. Soft tissues Again noted is chronic right UPJ obstruction with moderate to severe hydronephrosis. IMPRESSION: Nondisplaced fractures of the superior and inferior right pubic rami. 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 Electronically Signed   By: Margarette Canada M.D.   On: 02/11/2017 18:36   Ct Maxillofacial Wo Contrast  Result Date: 02/11/2017 CLINICAL DATA:  Patient fell Wednesday and hit the right side of her head and face on curb. Swelling and bruising on right side around orbit and frontal region. Patient has previous history of loss of disk height in her cervical spine EXAM: CT HEAD WITHOUT CONTRAST CT MAXILLOFACIAL WITHOUT CONTRAST CT CERVICAL SPINE WITHOUT CONTRAST TECHNIQUE: Multidetector CT imaging of the head, cervical spine, and maxillofacial structures were performed using the standard protocol without intravenous contrast. Multiplanar CT image reconstructions of the cervical spine and maxillofacial structures were also generated. COMPARISON:  Head CT dated 02/10/2015. MRI of the cervical spine dated 07/20/2016. FINDINGS: CT HEAD FINDINGS Brain: There is mild generalized parenchymal atrophy with commensurate dilatation of the ventricles and sulci. Mild chronic small vessel ischemic changes noted within the periventricular and subcortical white matter. There is no mass, hemorrhage, edema or other evidence of acute parenchymal abnormality. No extra-axial hemorrhage. Vascular: There are chronic calcified atherosclerotic changes of the large vessels at the skull base. No unexpected hyperdense vessel. Skull: Chronic-appearing deformity of the right  zygoma and right lateral orbital wall. No acute appearing fracture or displacement. Other: Soft tissue hematoma overlying the right frontal bone, measuring approximately 1.7 x 1 cm. No underlying fracture. CT MAXILLOFACIAL FINDINGS Osseous: Lower frontal bones are intact and normally aligned.  Old mild deformity of the right lateral orbital wall, stable compared to the earlier head CT from 2016. Osseous structures about the orbits are otherwise intact and normally aligned bilaterally. No displaced nasal bone fracture. Old healed fracture of the right zygoma. Left zygoma is intact and normally aligned. Bilateral pterygoid plates appear intact and normally aligned. Walls of the maxillary sinuses are intact and normally aligned bilaterally. Temporal bones appear intact and normally aligned. Orbits: Negative. No traumatic or inflammatory finding. Sinuses: Clear. Soft tissues: Unremarkable. CT CERVICAL SPINE FINDINGS Alignment: There is reversal of the normal cervical spine lordosis, likely related to the underlying degenerative changes, stable compared to the earlier MRI given patient positioning. No evidence of acute vertebral body subluxation. Skull base and vertebrae: No fracture line or displaced fracture fragment identified. Soft tissues and spinal canal: No prevertebral fluid or swelling. No visible canal hematoma. Disc levels: Degenerative changes throughout the cervical spine, with associated disc space narrowings and osseous spurring. Most significant degenerative change at the C5-6 and C6-7 levels with complete loss of disc space height and associated osseous spurring, likely source for the overlying reversal of normal cervical spine lordosis. There is, however, no more than mild central canal stenosis at any level. Degenerative hypertrophy of the uncovertebral and facet joints causing severe bilateral neural foramen stenoses at C3-4, severe bilateral neural foramen stenoses at C4-5, severe right neural foramen  stenosis at C5-6 and moderate to severe bilateral neural foramen stenoses at C6-7. Suspect associated nerve root impingement at 1 or more levels. Upper chest: Mass versus consolidation at the left lung apex, incompletely imaged at the lower aspects of the chest CT. Other: Carotid atherosclerosis. IMPRESSION: 1. No acute intracranial abnormality. No intracranial mass, hemorrhage or edema. Chronic small vessel ischemic changes in the white matter. 2. Focal soft tissue hematoma overlying the right frontal bone. No underlying fracture. 3. No acute facial bone fracture or displacement. Old healed fractures of the right zygoma and right lateral orbital wall. 4. No fracture or acute subluxation within the cervical spine. Degenerative changes of the cervical spine, at least moderate in degree, as detailed above. 5. Masslike density at the left lung apex, incompletely imaged at the lower aspects of this chest CT. This likely represents the upper aspect of the ectatic thoracic aortic arch, but would consider chest CT to confirm benignity. These results were called by telephone at the time of interpretation on 02/11/2017 at 6:15 pm to Dr. Maryan Rued, who verbally acknowledged these results. Electronically Signed   By: Franki Cabot M.D.   On: 02/11/2017 18:18    Echo 01/2017: Study Conclusions  - Left ventricle: The cavity size was normal. There was moderate focal basal hypertrophy of the septum. Systolic function was normal. The estimated ejection fraction was in the range of 60% to 65%. Wall motion was normal; there were no regional wall motion abnormalities. - Aortic valve: Transvalvular velocity was within the normal range. There was no stenosis. There was mild regurgitation. Regurgitation pressure half-time: 687 ms. - Aorta: Ascending aorta diameter: 50 mm (ED). - Aortic root: The aortic root was severely dilated. - Mitral valve: Transvalvular velocity was within the normal range. There was no  evidence for stenosis. There was trivial regurgitation. - Left atrium: The atrium was severely dilated. - Right ventricle: The cavity size was normal. Wall thickness was normal. Systolic function was normal. - Right atrium: The atrium was severely dilated. - Tricuspid valve: There was mild regurgitation, with multiple jets. - Pulmonary arteries: Systolic pressure was  within the normal range. PA peak pressure: 32 mm Hg (S).  Cath 01/2017: Conclusion     Ost RCA lesion, 50 %stenosed.  Mid LAD lesion, 50 %stenosed.  The left ventricular systolic function is normal. The left ventricular ejection fraction is 55-65% by visual estimate.  LV end diastolic pressure is normal.  Intragraft the mild to moderate disease but no flow-limiting lesions with preserved EF and normal LVEDP. Large-caliber almost ectatic vessels for patient's age.  No culprit lesion to explain the patient's positive troponins. Suspect could be related to A. fib RVR or stress.    ASSESSMENT/PLAN: MS. Calandro is an 83 yo woman with PMH atrial fibrillation on eliquis, HTN, hypothyroidism, acid reflux, recent NSTEMI seen at the request of Dr. Maryan Rued for type A aortic dissection.  The identification of her dissection was an incidental finding on the workup for a mechanical fall; without new symptoms it is difficult to know when this occurred. The size of the pericardial effusion suggests this happened gradually, as she has compensated.   Ct surgery was called after CT scan results. Please see Dr. Guy Sandifer thorough note. Decision at this time is to manage medically and not urgently repair in the OR. Patient's use of anticoagulation, pelvic fracture, and general wishes were considered, and decision was to not pursue surgery at this time.   -Admit to CCU, cardiology primary, CT surgery to follow -arterial line for goal BP control. First line beta blocker. IV labetalol to goal <120/80 -follow cbc,  electrolytes -hold eliquis. Discussed w/surgeon, will not reverse at this time. -stat echo done at bedside. Formal read tomorrow. 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 AI likely mild to moderate.  -patient hesitant about surgery. After I spoke to her and family, she seemed willing to reconsider if it is an option. She wishes to be DNR, family aware. -consider consulting ortho today for pelvic fracture activity recommendations  Lydia Jordan

## 2017-02-12 NOTE — Consult Note (Addendum)
[address]       [place] [address]             [phone]          CARDIOTHORACIC SURGERY CONSULTATION REPORT  PCP is Lavone Orn, MD Primary Cardiologist is Nahser, Wonda Cheng, MD Referring Physician is Blanchie Dessert, MD  Reason for consultation:  Type A aortic dissection  HPI:  Patient is an 83 year old female with history of hypertension, long-standing persistent atrial fibrillation on anticoagulation, severe degenerative arthritis and cervical disc disease with chronic pain who originally presented approximately one month ago with a prolonged episode of substernal chest pain in the setting of hypertension and epistaxis. Eliquis was held and epistaxis resolved.  She ruled in for an acute non-ST segment elevation myocardial infarction by serial cardiac enzymes.  Transthoracic echocardiogram revealed normal left ventricular systolic function with ejection fraction estimated 60-65%. There was moderate focal basal hypertrophy of the septum. The aortic valve was trileaflet with mild aortic insufficiency. There was severe left atrial enlargement with trivial mitral regurgitation. There was an aneurysm of the proximal ascending thoracic aorta measuring 5 cm in diameter. The patient underwent diagnostic cardiac catheterization revealing mild to moderate nonobstructive coronary artery disease with 50% stenosis in the left anterior descending coronary artery and 50% stenosis in the right coronary artery. Eliquis was resumed and the patient discharged home.  3 days ago the patient stumbled and fell while she was walking in the parking lot of her podiatrist office. She immediately developed pain in her right hip which has persisted. She hit her head and suffered multiple bruises. She ultimately presented to the emergency department where she underwent CT scan of the head, neck, and hip revealing nondisplaced fracture of the right pubic ramus. The aorta appeared abnormal on CT  scan of the neck, prompting CT scan of the chest with IV contrast which revealed type A aortic dissection. Cardiothoracic surgical consultation was requested.  The patient is divorced and lives locally in [place] in a house that is joined to her daughter's house. She has been retired for more than 15 years. She has suffered from severe degenerative arthritis and cervical disc disease which causes chronic pain for which she is followed in the pain clinic. Her mobility is fairly limited. She stopped driving an automobile several years ago. She still tends to most of her daily needs on her own and remains reasonably independent. She admits to progressive decreased energy with diminished exercise tolerance. She denies any recent chest pain other than the chest pain which occurred one day prior to her hospitalization in early April. On that day she experienced severe chest pain radiating across her chest which lasted the entire day. The pain had essentially resolved by the time she went to the hospital the following day for epistaxis. She has had no further chest pain or back pain since that time. She denies any exertional chest discomfort. She has not been short of breath. She denies any dizzy spells or syncope. He denies any transient visual disturbances or numbness or weaknesses involving either upper or lower extremity. She has been having some mild headaches since she fell and hit her head 3 days ago.  Past Medical History:  Diagnosis Date  . Acid reflux   . Arthritis   . Ascending aortic aneurysm ([place]) 01/11/2017  . CAD (coronary artery disease)    Cath 01/11/17 showed 50% ost RCA and 50% mLAD --> medical therapy   . Cervical  disc disease   . Chronic pain    Secondary to degenerative disc disease and arthritis  . Degenerative arthritis   . Hypertension   . Hypothyroidism   . Longstanding persistent atrial fibrillation ([place])   . NSTEMI (non-ST elevated myocardial infarction) ([place]) 01/10/2017  .  Pubic ramus fracture, right, closed, initial encounter ([place]) 02/12/2017  . Thyroid disease     Past Surgical History:  Procedure Laterality Date  . ESOPHAGEAL MANOMETRY N/A 07/31/2015   Procedure: ESOPHAGEAL MANOMETRY (EM);  Surgeon: Arta Silence, MD;  Location: [facility];  Service: Endoscopy;  Laterality: N/A;  . ESOPHAGOGASTRODUODENOSCOPY (EGD) WITH PROPOFOL N/A 09/02/2015   Procedure: ESOPHAGOGASTRODUODENOSCOPY (EGD) WITH PROPOFOL;  Surgeon: Arta Silence, MD;  Location: [facility];  Service: Endoscopy;  Laterality: N/A;  botulinum toxin injection (100 Units; 25 units in 4 quadrants 1-2 cm proximal to GE junction  . LEFT HEART CATH AND CORONARY ANGIOGRAPHY N/A 01/11/2017   Procedure: Left Heart Cath and Coronary Angiography;  Surgeon: Leonie Man, MD;  Location: [facility];  Service: Cardiovascular;  Laterality: N/A;  . skin cancer area removed      right leg healing well  . VAGINAL HYSTERECTOMY      Family History  Problem Relation Age of Onset  . Hypertension Mother   . Anuerysm Mother        died in her 62's  . Arthritis Father        died at age 55  . Heart attack Brother        died in 81's    Social History   Social History  . Marital status: Divorced    Spouse name: N/A  . Number of children: 1  . Years of education: N/A   Occupational History  . retired    Social History Main Topics  . Smoking status: Former Research scientist (life sciences)  . Smokeless tobacco: Never Used     Comment: Former occasional smoker x 3 yrs.  . Alcohol use No  . Drug use: No  . Sexual activity: Not on file   Other Topics Concern  . Not on file   Social History Narrative   Lives alone but in house connected to her daughter's house   Limited mobility due to severe degenerative arthritis    Prior to Admission medications   Medication Sig Start Date End Date Taking? Authorizing Provider  apixaban (ELIQUIS) 5 MG TABS tablet Take 1 tablet (5 mg total) by mouth 2 (two) times daily.  09/04/15   Arta Silence, MD  atorvastatin (LIPITOR) 20 MG tablet Take 1 tablet (20 mg total) by mouth daily. 01/12/17   Delos Haring, PA-C  diclofenac (VOLTAREN) 50 MG EC tablet Take 1 tablet (50 mg total) by mouth 2 (two) times daily. 01/26/17   Bhagat, Crista Luria, PA  digoxin (LANOXIN) 0.125 MG tablet Take 0.125 mg by mouth every morning.    [provider]  DULoxetine (CYMBALTA) 30 MG capsule Take 30 mg by mouth at bedtime.     [provider]  DULoxetine (CYMBALTA) 60 MG capsule Take 60 mg by mouth daily. 01/02/17   [provider]  HYDROcodone-acetaminophen (NORCO/VICODIN) 5-325 MG tablet Take 1 tablet by mouth 2 (two) times daily. Pain    [provider]  Liniments (SALONPAS PAIN RELIEF PATCH EX) Apply 1 patch topically daily as needed. For muscle pain    [provider]  nebivolol (BYSTOLIC) 5 MG tablet Take 5 mg by mouth every morning.    [provider]  nitroGLYCERIN (NITROSTAT) 0.4 MG SL tablet Place 1 tablet (0.4 mg total) under the tongue every 5 (five) minutes x 3 doses as needed for chest pain. 01/12/17   Delos Haring, PA-C  omeprazole (PRILOSEC) 40 MG capsule Take 40 mg by mouth at bedtime.    [provider]  SYNTHROID 100 MCG tablet Take 100 mcg by mouth daily before breakfast. 06/26/15   [provider]    No current facility-administered medications for this encounter.    Current Outpatient Prescriptions  Medication Sig Dispense Refill  . apixaban (ELIQUIS) 5 MG TABS tablet Take 1 tablet (5 mg total) by mouth 2 (two) times daily.    Marland Kitchen atorvastatin (LIPITOR) 20 MG tablet Take 1 tablet (20 mg total) by mouth daily. 30 tablet 6  . diclofenac (VOLTAREN) 50 MG EC tablet Take 1 tablet (50 mg total) by mouth 2 (two) times daily. 180 tablet 3  . digoxin (LANOXIN) 0.125 MG tablet Take 0.125 mg by mouth every morning.    . DULoxetine (CYMBALTA) 30 MG capsule Take 30 mg by mouth at bedtime.     . DULoxetine  (CYMBALTA) 60 MG capsule Take 60 mg by mouth daily.  1  . HYDROcodone-acetaminophen (NORCO/VICODIN) 5-325 MG tablet Take 1 tablet by mouth 2 (two) times daily. Pain    . Liniments (SALONPAS PAIN RELIEF PATCH EX) Apply 1 patch topically daily as needed. For muscle pain    . nebivolol (BYSTOLIC) 5 MG tablet Take 5 mg by mouth every morning.    . nitroGLYCERIN (NITROSTAT) 0.4 MG SL tablet Place 1 tablet (0.4 mg total) under the tongue every 5 (five) minutes x 3 doses as needed for chest pain. 12 tablet 2  . omeprazole (PRILOSEC) 40 MG capsule Take 40 mg by mouth at bedtime.    Marland Kitchen SYNTHROID 100 MCG tablet Take 100 mcg by mouth daily before breakfast.  11    Allergies  Allergen Reactions  . Demerol [Meperidine] Anaphylaxis    Reaction: "Heart stopped"      Review of Systems:   General:  normal appetite, decreased energy, no weight gain, no weight loss, no fever  Cardiac:  no chest pain with exertion, no chest pain at rest, no SOB with exertion, no resting SOB, no PND, no orthopnea, no palpitations, + arrhythmia, + atrial fibrillation, no LE edema, no dizzy spells, no syncope  Respiratory:  no shortness of breath, no home oxygen, no productive cough, no dry cough, no bronchitis, no wheezing, no hemoptysis, no asthma, no pain with inspiration or cough, no sleep apnea, no CPAP at night  GI:   no difficulty swallowing, + reflux, no frequent heartburn, no hiatal hernia, no abdominal pain, + constipation, no diarrhea, occasional hematochezia, no hematemesis, no melena  GU:   no dysuria,  no frequency, no urinary tract infection, no hematuria, no kidney stones, no kidney disease  Vascular:  no pain suggestive of claudication, + chronic pain in feet, no leg cramps, no varicose veins, no DVT, no non-healing foot ulcer  Neuro:   no stroke, no TIA's, no seizures, + headaches, no temporary blindness one eye,  no slurred speech, no peripheral neuropathy, + chronic pain, + instability of gait, no  memory/cognitive dysfunction  Musculoskeletal: + arthritis - primarily involving the cervical spine, back, knees and feet, + joint swelling, no myalgias, + difficulty walking, limited mobility   Skin:   no rash, no itching, no skin infections, no pressure sores or ulcerations  Psych:   no anxiety, no depression,  no nervousness, no unusual recent stress  Eyes:   no blurry vision, no floaters, no recent vision changes, + wears glasses or contacts  ENT:   no hearing loss, no loose or painful teeth, + dentures, last saw dentist a few years ago  Hematologic:  + easy bruising, no abnormal bleeding, no clotting disorder, + frequent epistaxis  Endocrine:  no diabetes, does not check CBG's at home     Physical Exam:   BP (!) 160/97   Pulse 92   Temp 98.7 F (37.1 C) (Oral)   Resp 20   SpO2 95%   General:  Elderly, somewhat frail-appearing  HEENT:  Ecchymosis right side of head and periorbital region  Neck:   no JVD, no bruits, no adenopathy  Chest:   clear to auscultation, symmetrical breath sounds, no wheezes, no rhonchi   CV:   RRR, no murmur   Abdomen:  soft, non-tender, no masses   Extremities:  warm, well-perfused, pulses palpable, no lower extremity edema  Rectal/GU  Deferred  Neuro:   Grossly non-focal and symmetrical throughout  Skin:   Clean and dry, no rashes, no breakdown  Diagnostic Tests:  Transthoracic Echocardiography  Patient:    Leotta, Weingarten MR #:       [id number] Study Date: 01/11/2017 Gender:     F Age:        82 Height:     160 cm Weight:     56.3 kg BSA:        1.59 m^2 Pt. Status: Room:   ATTENDING    Mertie Moores, M.D.  ADMITTING    Loralie Champagne, M.D.  Alen Bleacher, Tiffany  REFERRING    Carlota Raspberry, [place], Inpatient  SONOGRAPHER  Cardell Peach, RDCS  cc:  ------------------------------------------------------------------- LV EF: 60% -    65%  ------------------------------------------------------------------- Indications:      Chest pain 786.51.  ------------------------------------------------------------------- History:   PMH:   Atrial fibrillation.  Risk factors: Hypertension.  ------------------------------------------------------------------- Study Conclusions  - Left ventricle: The cavity size was normal. There was moderate   focal basal hypertrophy of the septum. Systolic function was   normal. The estimated ejection fraction was in the range of 60%   to 65%. Wall motion was normal; there were no regional wall   motion abnormalities. - Aortic valve: Transvalvular velocity was within the normal range.   There was no stenosis. There was mild regurgitation.   Regurgitation pressure half-time: 687 ms. - Aorta: Ascending aorta diameter: 50 mm (ED). - Aortic root: The aortic root was severely dilated. - Mitral valve: Transvalvular velocity was within the normal range.   There was no evidence for stenosis. There was trivial   regurgitation. - Left atrium: The atrium was severely dilated. - Right ventricle: The cavity size was normal. Wall thickness was   normal. Systolic function was normal. - Right atrium: The atrium was severely dilated. - Tricuspid valve: There was mild regurgitation, with multiple   jets. - Pulmonary arteries: Systolic pressure was within the normal   range. PA peak pressure: 32 mm Hg (S).  ------------------------------------------------------------------- Study data:  No prior study was available for comparison.  Study status:  Routine.  Procedure:  Transthoracic echocardiography. Image quality was adequate.  Study completion:  There were no complications.          Transthoracic echocardiography.  M-mode, complete 2D, spectral Doppler, and color Doppler.  Birthdate: Patient birthdate: [birth year].  Age:  Patient is 83  yr old.  Sex: Gender: female.    BMI: 22 kg/m^2.  Blood pressure:      108/77 Patient status:  Inpatient.  Study date:  Study date: 01/11/2017. Study time: 09:03 AM.  Location:  Bedside.  -------------------------------------------------------------------  ------------------------------------------------------------------- Left ventricle:  The cavity size was normal. There was moderate focal basal hypertrophy of the septum. Systolic function was normal. The estimated ejection fraction was in the range of 60% to 65%. Wall motion was normal; there were no regional wall motion abnormalities. The study was not technically sufficient to allow evaluation of LV diastolic dysfunction due to atrial fibrillation.   ------------------------------------------------------------------- Aortic valve:   Trileaflet; normal thickness leaflets. Mobility was not restricted.  Doppler:  Transvalvular velocity was within the normal range. There was no stenosis. There was mild regurgitation.   ------------------------------------------------------------------- Aorta:  Aortic root: The aortic root was severely dilated.  ------------------------------------------------------------------- Mitral valve:   Structurally normal valve.   Mobility was not restricted.  Doppler:  Transvalvular velocity was within the normal range. There was no evidence for stenosis. There was trivial regurgitation.  ------------------------------------------------------------------- Left atrium:  The atrium was severely dilated.  ------------------------------------------------------------------- Right ventricle:  The cavity size was normal. Wall thickness was normal. Systolic function was normal.  ------------------------------------------------------------------- Pulmonic valve:    Structurally normal valve.   Cusp separation was normal.  Doppler:  Transvalvular velocity was within the normal range. There was no evidence for stenosis. There was  no regurgitation.  ------------------------------------------------------------------- Tricuspid valve:   Structurally normal valve.    Doppler: Transvalvular velocity was within the normal range. There was mild regurgitation, with multiple jets.  ------------------------------------------------------------------- Pulmonary artery:   The main pulmonary artery was normal-sized. Systolic pressure was within the normal range.  ------------------------------------------------------------------- Right atrium:  The atrium was severely dilated.  ------------------------------------------------------------------- Pericardium:  There was no pericardial effusion.  ------------------------------------------------------------------- Systemic veins: Inferior vena cava: The vessel was normal in size. The respirophasic diameter changes were blunted (< 50%), consistent with elevated central venous pressure.  ------------------------------------------------------------------- Measurements   Left ventricle                           Value        Reference  LV ID, ED, PLAX chordal          (L)     38    mm     43 - 52  LV ID, ES, PLAX chordal                  25    mm     23 - 38  LV fx shortening, PLAX chordal           34    %      >=29  LV PW thickness, ED                      10    mm     ---------  IVS/LV PW ratio, ED              (H)     1.64         <=1.3  Stroke volume, 2D                        49    ml     ---------  Stroke volume/bsa, 2D  31    ml/m^2 ---------  LV ejection fraction, 1-p A4C            71    %      ---------    Ventricular septum                       Value        Reference  IVS thickness, ED                        16.44 mm     ---------    LVOT                                     Value        Reference  LVOT ID, S                               19    mm     ---------  LVOT area                                2.84  cm^2   ---------  LVOT peak  velocity, S                    108   cm/s   ---------  LVOT mean velocity, S                    68.4  cm/s   ---------  LVOT VTI, S                              17.4  cm     ---------  LVOT peak gradient, S                    5     mm Hg  ---------    Aortic valve                             Value        Reference  Aortic regurg pressure half-time         687   ms     ---------    Aorta                                    Value        Reference  Aortic root ID, ED                       30    mm     ---------  Ascending aorta ID, A-P, ED              50    mm     ---------    Left atrium                              Value        Reference  LA ID, A-P, ES  34    mm     ---------  LA ID/bsa, A-P                           2.14  cm/m^2 <=2.2  LA volume, S                             75.9  ml     ---------  LA volume/bsa, S                         47.9  ml/m^2 ---------  LA volume, ES, 1-p A4C                   75.3  ml     ---------  LA volume/bsa, ES, 1-p A4C               47.5  ml/m^2 ---------  LA volume, ES, 1-p A2C                   71.4  ml     ---------  LA volume/bsa, ES, 1-p A2C               45    ml/m^2 ---------    Pulmonary arteries                       Value        Reference  PA pressure, S, DP               (H)     32    mm Hg  <=30    Tricuspid valve                          Value        Reference  Tricuspid regurg peak velocity           246   cm/s   ---------  Tricuspid peak RV-RA gradient            24    mm Hg  ---------    Systemic veins                           Value        Reference  Estimated CVP                            8     mm Hg  ---------    Right ventricle                          Value        Reference  RV ID, minor axis, ED, A4C base          28    mm     ---------  TAPSE                                    13.4  mm     ---------  RV pressure, S, DP               (H)     32  mm Hg  <=30  RV s&', lateral, S                         9.67  cm/s   ---------  Legend: (L)  and  (H)  mark values outside specified reference range.  ------------------------------------------------------------------- Prepared and Electronically Authenticated by  Skeet Latch, MD 2018-04-11T14:01:59   Left Heart Cath and Coronary Angiography  Conclusion     Ost RCA lesion, 50 %stenosed.  Mid LAD lesion, 50 %stenosed.  The left ventricular systolic function is normal. The left ventricular ejection fraction is 55-65% by visual estimate.  LV end diastolic pressure is normal.   Intragraft the mild to moderate disease but no flow-limiting lesions with preserved EF and normal LVEDP. Large-caliber almost ectatic vessels for patient's age.  No culprit lesion to explain the patient's positive troponins. Suspect could be related to A. fib RVR or stress.  Plan: Monitor tonight and restart ELIQUIS tomorrow. Medical management for mild to moderate CAD.  Anticipate discharge tomorrow per Dr. Fara Olden, M.D., M.S. Interventional Cardiologist   Pager # [phone] Phone # [phone] [address] [place], [place] [address]    Indications   Non-ST elevation (NSTEMI) myocardial infarction ([place]) [I21.4 (ICD-10-CM)]  Chronic atrial fibrillation ([place]) [Y10.1 (ICD-10-CM)]  Procedural Details/Technique   Technical Details PCP: Irven Shelling, MD CARDIOLOGIST: Acie Fredrickson, Jaime Dome is a 83 y.o. female with a history of atrial fibrillation (diagnosed approx 2007) on Eliquis, hypertension, hypothyroidism, acid reflux, chronic pain. She presented to the ER last night (4/9) for epistaxis. He noted having a strange atypical sensation in her chest and neck, and was found to have positive for possible non-STEMI. She is therefore referred for invasive evaluation after Eliquis is been held.  Time Out: Verified patient identification, verified procedure, site/side was marked, verified  correct patient position, special equipment/implants available, medications/allergies/relevent history reviewed, required imaging and test results available. Performed. Consent Signed.  Access:  RIGHT Radial Artery: 6 Fr sheath -- Seldinger technique using Angiocath Micropuncture Kit 10 mL radial cocktail IA; 3500 Units IV Heparin  Left Heart Catheterization: 5 Fr Catheters advanced or exchanged over a J-wire under direct fluoroscopic guidance into the ascending aorta; TIG 4.0 catheter advanced first.  Left & Right Coronary Artery Cineangiography: TIG 4.0 Catheter  LV Hemodynamics (LV Gram): TIG 4.0 Catheter   Angiographic images reviewed. No culprit lesions. Case complete. Catheter removed leaving the body over wire.  RADIAL Sheath(s) removed in the Cath Lab with TR band placed for hemostasis.   TR Band: 1340 Hours; 12 mL air  MEDICATIONS * SQ Lidocaine 35m * Radial Cocktail: 3 mg verapamil in 10 mL NS * Isovue Contrast: 80 mL * Heparin: 3500 Units   Estimated blood loss <50 mL.  During this procedure the patient was administered the following to achieve and maintain moderate conscious sedation: Versed 0.1 mg, Fentanyl 12.5 mcg, while the patient's heart rate, blood pressure, and oxygen saturation were continuously monitored. The period of conscious sedation was 17 minutes, of which I was present face-to-face 100% of this time.    Complications   Complications documented before study signed (01/11/2017 2:10 PM EDT)    No complications were associated with this study.  Documented by HLeonie Man MD - 01/11/2017 2:06 PM EDT    Coronary Findings   Dominance: Right  Left Main  Vessel is large.  Left Anterior Descending  Vessel is large. The vessel exhibits  minimal luminal irregularities.  Mid LAD lesion, 50% stenosed. The lesion is focal and discrete. Just after a short ectatic segment  First Diagonal Branch  Vessel is small in size.  First Septal Branch  Vessel is  small in size.  Second Diagonal Branch  Vessel is moderate in size.  Second Septal Branch  Vessel is small in size.  Third Septal Branch  Vessel is small in size.  Left Circumflex  Vessel is large. Very large, ectatic vessel with swirling flow right at the takeoff of the atrial branch which is a moderate caliber, extensive branch that covers a very large atrium. Mostly the circumflex courses as a lateral OM.  Lateral First Obtuse Marginal Branch  Vessel is moderate in size.  Right Coronary Artery  Vessel is large. Very large caliber vessel with mild ectasia in the mid vessel.  Ost RCA lesion, 50% stenosed. The lesion is located proximal to the major branch and eccentric.  Acute Marginal Branch  Vessel is moderate in size.  Right Posterior Descending Artery  Vessel is moderate in size.  Inferior Septal  Vessel is small in size.  Right Posterior Atrioventricular Branch  Vessel is moderate in size.  First Right Posterolateral  Vessel is small in size.  Wall Motion              Left Heart   Left Ventricle The left ventricular size is normal. The left ventricular systolic function is normal. LV end diastolic pressure is normal. The left ventricular ejection fraction is 55-65% by visual estimate. There is no evidence of mitral regurgitation.    Aortic Valve There is no aortic valve stenosis. There is normal aortic valve motion.    Coronary Diagrams   Diagnostic Diagram       Implants     No implant documentation for this case.  PACS Images   Show images for Cardiac catheterization   Link to Procedure Log   Procedure Log    Hemo Data    Most Recent Value  AO Systolic Pressure 643 mmHg  AO Diastolic Pressure 65 mmHg  AO Mean 85 mmHg  LV Systolic Pressure 95 mmHg  LV Diastolic Pressure 3 mmHg  LV EDP 11 mmHg  Arterial Occlusion Pressure Extended Systolic Pressure 97 mmHg  Arterial Occlusion Pressure Extended Diastolic Pressure 59 mmHg  Arterial Occlusion  Pressure Extended Mean Pressure 74 mmHg  Left Ventricular Apex Extended Systolic Pressure 329 mmHg  Left Ventricular Apex Extended Diastolic Pressure 6 mmHg  Left Ventricular Apex Extended EDP Pressure 12 mmHg     CT HEAD WITHOUT CONTRAST  CT MAXILLOFACIAL WITHOUT CONTRAST  CT CERVICAL SPINE WITHOUT CONTRAST  TECHNIQUE: Multidetector CT imaging of the head, cervical spine, and maxillofacial structures were performed using the standard protocol without intravenous contrast. Multiplanar CT image reconstructions of the cervical spine and maxillofacial structures were also generated.  COMPARISON:  Head CT dated 02/10/2015. MRI of the cervical spine dated 07/20/2016.  FINDINGS: CT HEAD FINDINGS  Brain: There is mild generalized parenchymal atrophy with commensurate dilatation of the ventricles and sulci. Mild chronic small vessel ischemic changes noted within the periventricular and subcortical white matter.  There is no mass, hemorrhage, edema or other evidence of acute parenchymal abnormality. No extra-axial hemorrhage.  Vascular: There are chronic calcified atherosclerotic changes of the large vessels at the skull base. No unexpected hyperdense vessel.  Skull: Chronic-appearing deformity of the right zygoma and right lateral orbital wall. No acute appearing fracture or displacement.  Other: Soft tissue hematoma  overlying the right frontal bone, measuring approximately 1.7 x 1 cm. No underlying fracture.  CT MAXILLOFACIAL FINDINGS  Osseous: Lower frontal bones are intact and normally aligned. Old mild deformity of the right lateral orbital wall, stable compared to the earlier head CT from 2016. Osseous structures about the orbits are otherwise intact and normally aligned bilaterally. No displaced nasal bone fracture. Old healed fracture of the right zygoma. Left zygoma is intact and normally aligned. Bilateral pterygoid plates appear intact and normally  aligned. Walls of the maxillary sinuses are intact and normally aligned bilaterally. Temporal bones appear intact and normally aligned.  Orbits: Negative. No traumatic or inflammatory finding.  Sinuses: Clear.  Soft tissues: Unremarkable.  CT CERVICAL SPINE FINDINGS  Alignment: There is reversal of the normal cervical spine lordosis, likely related to the underlying degenerative changes, stable compared to the earlier MRI given patient positioning. No evidence of acute vertebral body subluxation.  Skull base and vertebrae: No fracture line or displaced fracture fragment identified.  Soft tissues and spinal canal: No prevertebral fluid or swelling. No visible canal hematoma.  Disc levels: Degenerative changes throughout the cervical spine, with associated disc space narrowings and osseous spurring. Most significant degenerative change at the C5-6 and C6-7 levels with complete loss of disc space height and associated osseous spurring, likely source for the overlying reversal of normal cervical spine lordosis. There is, however, no more than mild central canal stenosis at any level.  Degenerative hypertrophy of the uncovertebral and facet joints causing severe bilateral neural foramen stenoses at C3-4, severe bilateral neural foramen stenoses at C4-5, severe right neural foramen stenosis at C5-6 and moderate to severe bilateral neural foramen stenoses at C6-7. Suspect associated nerve root impingement at 1 or more levels.  Upper chest: Mass versus consolidation at the left lung apex, incompletely imaged at the lower aspects of the chest CT.  Other: Carotid atherosclerosis.  IMPRESSION: 1. No acute intracranial abnormality. No intracranial mass, hemorrhage or edema. Chronic small vessel ischemic changes in the white matter. 2. Focal soft tissue hematoma overlying the right frontal bone. No underlying fracture. 3. No acute facial bone fracture or displacement.  Old healed fractures of the right zygoma and right lateral orbital wall. 4. No fracture or acute subluxation within the cervical spine. Degenerative changes of the cervical spine, at least moderate in degree, as detailed above. 5. Masslike density at the left lung apex, incompletely imaged at the lower aspects of this chest CT. This likely represents the upper aspect of the ectatic thoracic aortic arch, but would consider chest CT to confirm benignity. These results were called by telephone at the time of interpretation on 02/11/2017 at 6:15 pm to Dr. Maryan Rued, who verbally acknowledged these results.   Electronically Signed   By: Franki Cabot M.D.   On: 02/11/2017 18:18   CT OF THE RIGHT HIP WITHOUT CONTRAST  TECHNIQUE: Multidetector CT imaging of the right hip was performed according to the standard protocol. Multiplanar CT image reconstructions were also generated.  COMPARISON:  07/10/2006 renogram and 06/06/2006 chest CT.  FINDINGS: Bones/Joint/Cartilage  Nondisplaced fractures of the superior and inferior right pubic rami are noted.  No other fracture, subluxation or dislocation identified.  Ligaments  Suboptimally assessed by CT.  Soft tissues  Again noted is chronic right UPJ obstruction with moderate to severe hydronephrosis.  IMPRESSION: Nondisplaced fractures of the superior and inferior right pubic rami.  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   Electronically Signed   By: Dellis Filbert  Hu M.D.   On: 02/11/2017 18:36    CT CHEST WITH CONTRAST  TECHNIQUE: Multidetector CT imaging of the chest was performed during intravenous contrast administration.  CONTRAST:  75 mL ISOVUE-300 IOPAMIDOL (ISOVUE-300) INJECTION 61%  COMPARISON:  CT of the chest performed 06/06/2006, and chest radiograph performed 01/10/2017  FINDINGS: Cardiovascular: There appears to be an acute or subacute Stanford type A  dissection of the thoracic aorta, with dilatation of the ascending thoracic aorta to 5.8 cm in maximal AP dimension. The dissection extends along the ascending thoracic aorta, aortic arch and proximal descending thoracic aorta, and there appears to be partial thrombosis of the false lumen. An apparent spontaneous fenestration is noted at the level of the proximal aortic arch.  The great vessels appear to arise from the true lumen of the thoracic aorta. The dissection appears to resolve at the proximal descending thoracic aorta. This may reflect lower pressure than usual due to the combination of spontaneous fenestration and extravasation into the patient's apparent moderate hemopericardium.  Mediastinum/Nodes: A moderate pericardial effusion is noted, with areas of mildly increased attenuation, suspicious for hemopericardium. There also appears to be a trace left-sided pleural effusion, with mildly increased attenuation, concerning for trace left-sided hemothorax. This likely reflects extension of hemorrhage across the aortic adventitia into the pericardial space and left pleural space. No definite acute contrast extravasation is identified.  Lungs/Pleura: Minimal bilateral atelectasis is noted. No pneumothorax is seen. As described above, there is suspicion of trace left-sided hemothorax. No masses are seen.  Upper Abdomen: The visualized abdominal aorta remains normal in caliber. There is no evidence of aortic dissection along the abdominal aorta at this time. Scattered calcification is seen along the proximal abdominal aorta.  The visualized portions of the liver and spleen are grossly unremarkable. The visualized portions of the gallbladder, pancreas, adrenal glands and kidneys are within normal limits. A tiny hiatal hernia is noted.  Musculoskeletal: No acute osseous abnormalities are identified. Degenerative joint space narrowing is noted at the right glenohumeral  joint, with apparent scattered loose bodies and underlying diffuse joint space calcification. The visualized musculature is unremarkable in appearance.  IMPRESSION: 1. 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 The dissection extends along the ascending thoracic aorta, aortic arch and proximal descending thoracic aorta, with partial thrombosis of the false lumen. Apparent spontaneous fenestration noted at the level of the proximal aortic arch. 2. Moderate pericardial effusion demonstrates areas of mildly increased attenuation, suspicious for hemopericardium. Trace left-sided pleural effusion also demonstrates mildly increased attenuation, concerning for trace left-sided hemothorax. This likely reflects extension of hemorrhage across the aortic adventitia to the pericardial space and left pleural space. No definite acute contrast extravasation seen at this time. 3. The dissection appears to resolve at the proximal descending thoracic aorta, and the distal descending thoracic aorta and abdominal aorta are unremarkable in appearance, aside from scattered aortic atherosclerosis. This may reflect lower pressure than usual due to the combination of spontaneous fenestration and extravasation into the patient's apparent moderate hemopericardium. The great vessels arise from the true lumen. 4. Minimal bilateral atelectasis noted. 5. Degenerative change of the right glenohumeral joint, with scattered loose bodies and underlying diffuse joint space calcification.  Critical Value/emergent results were called by telephone at the time of interpretation on 02/11/2017 at 10:44 pm to Dr. Blanchie Dessert, who verbally acknowledged these results.   Electronically Signed   By: Jacqulynn Cadet  Chang M.D.   On: 02/11/2017 22:59      Impression:  I have personally reviewed the patient's CT  scan of chest performed this evening and the recent transthoracic echocardiogram and diagnostic cardiac catheterization performed last month. She has a likely subacute type A aortic dissection that was discovered serendipitously during CT imaging performed because of her recent fall.  In retrospect, she describes a prolonged episode of severe substernal chest pain occurring approximately one month ago immediately prior to her hospitalization for epistaxis and an acute non-ST segment elevation myocardial infarction. 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 I suspect that her aorta was dissected at that time.  She has not had any further symptoms of chest pain nor signs of congestive heart failure over the past month. She remains remarkably stable with normal or elevated blood pressure despite the fact that she has been chronically anticoagulated using Eliquis.  CT scan of the chest reveals an aortic dissection involving the ascending thoracic aorta, transverse aortic arch, and proximal descending thoracic aorta. The dissection does not extend into the cranial vessels nor the abdominal aorta.  There is a small pericardial effusion.   Plan:  I discussed the nature of the patient's aortic dissection at length with the patient, her sister, and her daughter at the bedside in the emergency department. We discussed the natural history of aortic dissection and treatment options at length. We discussed the fact that her aortic dissection probably occurred one month ago when she initially experienced a prolonged episode of severe substernal chest pain, although at this point is impossible to know for certain exactly when the dissection occurred. We discussed the fact that in the acute setting there is very high risk of mortality for acute type A aortic  dissection, but a small percentage of patients treated with medical therapy stabilize and can survive for a prolonged period time.  We discussed the indications, risks, and potential benefits of surgery for definitive management of the ascending and transverse thoracic aorta as well as expectations for the patient's postoperative convalescence in the setting of uncomplicated surgery. We discussed the impact of ongoing anticoagulation at length and the fact that risks associated with surgical intervention would be much higher if surgery were performed prior to reversal of the patient's anticoagulation.  At this point the patient seems disinclined to consider surgical intervention. I would not consider her a candidate for proceeding with emergency surgery prior to reversal of her anticoagulation. However, it would not be unreasonable to proceed with surgery in a few days after the effects of Eliquis have been completely dissipated if the patient were agreeable. I recommend performing transthoracic echocardiogram immediately to further assess the size of the patient's pericardial effusion, whether or not there are any signs of impending pericardial tamponade, and the degree of aortic insufficiency. I recommend admission to the intensive care unit for strict blood pressure monitoring and further attention to the patient's other ongoing medical problems including those related to her recent fall. In the meanwhile, the patient specifically requests that she would not be considered a candidate for resuscitation were she to suddenly develop cardiovascular collapse. I have answered all the questions of the patient and her family and will continue to follow closely.   I spent in excess of 120 minutes during the conduct of this hospital consultation and >50% of this time involved direct face-to-face encounter for counseling  and/or coordination of the patient's care.    Valentina Gu. Roxy Manns, MD 02/12/2017 12:40 AM

## 2017-02-13 ENCOUNTER — Inpatient Hospital Stay (HOSPITAL_COMMUNITY): Payer: Medicare Other

## 2017-02-13 DIAGNOSIS — Z01810 Encounter for preprocedural cardiovascular examination: Secondary | ICD-10-CM

## 2017-02-13 LAB — CBC
HEMATOCRIT: 27.3 % — AB (ref 36.0–46.0)
HEMOGLOBIN: 8.9 g/dL — AB (ref 12.0–15.0)
MCH: 30.7 pg (ref 26.0–34.0)
MCHC: 32.6 g/dL (ref 30.0–36.0)
MCV: 94.1 fL (ref 78.0–100.0)
PLATELETS: 440 10*3/uL — AB (ref 150–400)
RBC: 2.9 MIL/uL — AB (ref 3.87–5.11)
RDW: 15.1 % (ref 11.5–15.5)
WBC: 10 10*3/uL (ref 4.0–10.5)

## 2017-02-13 LAB — VAS US DOPPLER PRE CABG
LCCADDIAS: 18 cm/s
LEFT ECA DIAS: -5 cm/s
LEFT VERTEBRAL DIAS: 7 cm/s
LICADSYS: -83 cm/s
LICAPSYS: -79 cm/s
Left CCA dist sys: 64 cm/s
Left CCA prox dias: -13 cm/s
Left CCA prox sys: -75 cm/s
Left ICA dist dias: -38 cm/s
Left ICA prox dias: -24 cm/s
RIGHT ECA DIAS: -8 cm/s
RIGHT VERTEBRAL DIAS: 19 cm/s
Right CCA prox dias: -18 cm/s
Right CCA prox sys: -75 cm/s
Right cca dist sys: -59 cm/s

## 2017-02-13 LAB — COMPREHENSIVE METABOLIC PANEL
ALT: 13 U/L — ABNORMAL LOW (ref 14–54)
ANION GAP: 8 (ref 5–15)
AST: 16 U/L (ref 15–41)
Albumin: 2.9 g/dL — ABNORMAL LOW (ref 3.5–5.0)
Alkaline Phosphatase: 95 U/L (ref 38–126)
BUN: 14 mg/dL (ref 6–20)
CHLORIDE: 99 mmol/L — AB (ref 101–111)
CO2: 25 mmol/L (ref 22–32)
CREATININE: 0.88 mg/dL (ref 0.44–1.00)
Calcium: 8.8 mg/dL — ABNORMAL LOW (ref 8.9–10.3)
GFR, EST NON AFRICAN AMERICAN: 60 mL/min — AB (ref >60)
Glucose, Bld: 111 mg/dL — ABNORMAL HIGH (ref 65–99)
POTASSIUM: 4.3 mmol/L (ref 3.5–5.1)
SODIUM: 132 mmol/L — AB (ref 135–145)
Total Bilirubin: 0.8 mg/dL (ref 0.3–1.2)
Total Protein: 6.5 g/dL (ref 6.5–8.1)

## 2017-02-13 LAB — POCT I-STAT 3, ART BLOOD GAS (G3+)
Acid-Base Excess: 2 mmol/L (ref 0.0–2.0)
Bicarbonate: 25.9 mmol/L (ref 20.0–28.0)
O2 Saturation: 98 %
PCO2 ART: 37.4 mmHg (ref 32.0–48.0)
PH ART: 7.448 (ref 7.350–7.450)
TCO2: 27 mmol/L (ref 0–100)
pO2, Arterial: 93 mmHg (ref 83.0–108.0)

## 2017-02-13 LAB — PROTIME-INR
INR: 1.49
Prothrombin Time: 18.2 seconds — ABNORMAL HIGH (ref 11.4–15.2)

## 2017-02-13 LAB — BRAIN NATRIURETIC PEPTIDE: B Natriuretic Peptide: 276.7 pg/mL — ABNORMAL HIGH (ref 0.0–100.0)

## 2017-02-13 LAB — APTT: APTT: 72 s — AB (ref 24–36)

## 2017-02-13 LAB — PREALBUMIN: PREALBUMIN: 12.4 mg/dL — AB (ref 18–38)

## 2017-02-13 LAB — ABO/RH: ABO/RH(D): O POS

## 2017-02-13 NOTE — Progress Notes (Signed)
Pre-op Cardiac Surgery  Carotid Findings:   Findings are consistent with a 1-39 percent stenosis involving the right internal carotid artery and the left internal carotid artery. The vertebral arteries demonstrate antegrade flow.  Upper Extremity Right Left  Brachial Pressures 143  Triphasic 133  Triphasic  Radial Waveforms Triphasic Triphasic  Ulnar Waveforms Triphasic Triphasic  Palmar Arch (Allen's Test) Palmar waveforms remain within normal limits with radial compression and are diminished greater than fifty percent with ulnar compression. Palmar waveforms remain within normal limits with radial compression and are diminished greater than fifty percent with ulnar compression.   02/13/17 11:57 AM Lydia Jordan RVT

## 2017-02-13 NOTE — Progress Notes (Signed)
Progress Note  Patient Name: Lydia Jordan Date of Encounter: 02/13/2017  Primary Cardiologist: Recently became the patient for Dr. Acie Fredrickson  Patient Profile     83 y.o. female with a history of chronic atrial fibrillation on ELIQUIS, recent non-STEMI with minimal CAD as well as hypertension and hypothyroidism who presented after mechanical fall on May 19. She suffered a pelvic fracture. During this evaluation she had a CT scan which revealed a type a aortic dissection complicated by pericardial effusion.  Subjective   Relatively well this morning. More troubled by soreness in her hip. Denies any dyspnea or chest pain. No sensation of irregular heartbeat.  Inpatient Medications    Scheduled Meds: . atorvastatin  20 mg Oral Daily  . digoxin  0.125 mg Oral q morning - 10a  . DULoxetine  30 mg Oral QHS  . DULoxetine  60 mg Oral Daily  . levothyroxine  100 mcg Oral QAC breakfast  . pantoprazole  80 mg Oral Daily  . sodium chloride flush  3 mL Intravenous Q12H   Continuous Infusions: . sodium chloride 10 mL/hr at 02/13/17 0600  . labetalol (NORMODYNE) infusion Stopped (02/13/17 0300)   PRN Meds: Place/Maintain arterial line **AND** sodium chloride, oxyCODONE-acetaminophen   Vital Signs    Vitals:   02/13/17 0400 02/13/17 0500 02/13/17 0600 02/13/17 0758  BP: 101/66 126/79 120/79   Pulse: (!) 52 81 60   Resp: (!) 27 (!) 21 14   Temp:    97.5 F (36.4 C)  TempSrc:    Oral  SpO2: 100% 95% 96%   Weight:  131 lb 13.4 oz (59.8 kg)    Height:        Intake/Output Summary (Last 24 hours) at 02/13/17 0912 Last data filed at 02/13/17 0600  Gross per 24 hour  Intake           243.69 ml  Output              651 ml  Net          -407.31 ml   Filed Weights   02/12/17 0400 02/12/17 0600 02/13/17 0500  Weight: 132 lb 14.4 oz (60.3 kg) 134 lb 0.6 oz (60.8 kg) 131 lb 13.4 oz (59.8 kg)    Telemetry    Atrial fibrillation with ranging rates in the 70s to 80s - Personally  Reviewed  ECG    NA - Personally Reviewed  Physical Exam   GEN: No acute distress.  Resting comfortably; diffuse ecchymosis on her right side of her forehead and face from her fall. Neck: No JVD or carotid bruit Cardiac:  irregularly regular rate and rhythm. Soft 1-2/6 HSM at apex. Respiratory:  CTA B. Nonlabored. Good air movement. - Anterior fields GI: Soft, nontender, non-distended  MS: No edema; No deformity. - SCDs in place Neuro:  Nonfocal  Psych: Pleasant mood and affect  Labs    Chemistry  Recent Labs Lab 02/11/17 1931 02/12/17 0606 02/13/17 0227  NA 133* 131* 132*  K 5.0 4.1 4.3  CL 98* 98* 99*  CO2  --  25 25  GLUCOSE 106* 113* 111*  BUN 20 14 14   CREATININE 0.80 0.81 0.88  CALCIUM  --  8.7* 8.8*  PROT  --   --  6.5  ALBUMIN  --   --  2.9*  AST  --   --  16  ALT  --   --  13*  ALKPHOS  --   --  95  BILITOT  --   --  0.8  GFRNONAA  --  >60 60*  GFRAA  --  >60 >60  ANIONGAP  --  8 8     Hematology  Recent Labs Lab 02/11/17 1931 02/12/17 0606 02/13/17 0227  WBC  --  10.2 10.0  RBC  --  2.87* 2.90*  HGB 10.2* 8.9* 8.9*  HCT 30.0* 27.1* 27.3*  MCV  --  94.4 94.1  MCH  --  31.0 30.7  MCHC  --  32.8 32.6  RDW  --  15.0 15.1  PLT  --  390 440*    Cardiac EnzymesNo results for input(s): TROPONINI in the last 168 hours.   Recent Labs Lab 02/11/17 2308  TROPIPOC 0.00     BNP  Recent Labs Lab 02/13/17 0228  BNP 276.7*     DDimer No results for input(s): DDIMER in the last 168 hours.   Radiology    Ct Head Wo Contrast  Result Date: 02/11/2017 CLINICAL DATA:  Patient fell Wednesday and hit the right side of her head and face on curb. Swelling and bruising on right side around orbit and frontal region. Patient has previous history of loss of disk height in her cervical spine EXAM: CT HEAD WITHOUT CONTRAST CT MAXILLOFACIAL WITHOUT CONTRAST CT CERVICAL SPINE WITHOUT CONTRAST TECHNIQUE: Multidetector CT imaging of the head, cervical spine,  and maxillofacial structures were performed using the standard protocol without intravenous contrast. Multiplanar CT image reconstructions of the cervical spine and maxillofacial structures were also generated. COMPARISON:  Head CT dated 02/10/2015. MRI of the cervical spine dated 07/20/2016. FINDINGS: CT HEAD FINDINGS Brain: There is mild generalized parenchymal atrophy with commensurate dilatation of the ventricles and sulci. Mild chronic small Jordan ischemic changes noted within the periventricular and subcortical white matter. There is no mass, hemorrhage, edema or other evidence of acute parenchymal abnormality. No extra-axial hemorrhage. Vascular: There are chronic calcified atherosclerotic changes of the large vessels at the skull base. No unexpected hyperdense Jordan. Skull: Chronic-appearing deformity of the right zygoma and right lateral orbital wall. No acute appearing fracture or displacement. Other: Soft tissue hematoma overlying the right frontal bone, measuring approximately 1.7 x 1 cm. No underlying fracture. CT MAXILLOFACIAL FINDINGS Osseous: Lower frontal bones are intact and normally aligned. Old mild deformity of the right lateral orbital wall, stable compared to the earlier head CT from 2016. Osseous structures about the orbits are otherwise intact and normally aligned bilaterally. No displaced nasal bone fracture. Old healed fracture of the right zygoma. Left zygoma is intact and normally aligned. Bilateral pterygoid plates appear intact and normally aligned. Walls of the maxillary sinuses are intact and normally aligned bilaterally. Temporal bones appear intact and normally aligned. Orbits: Negative. No traumatic or inflammatory finding. Sinuses: Clear. Soft tissues: Unremarkable. CT CERVICAL SPINE FINDINGS Alignment: There is reversal of the normal cervical spine lordosis, likely related to the underlying degenerative changes, stable compared to the earlier MRI given patient positioning. No  evidence of acute vertebral body subluxation. Skull base and vertebrae: No fracture line or displaced fracture fragment identified. Soft tissues and spinal canal: No prevertebral fluid or swelling. No visible canal hematoma. Disc levels: Degenerative changes throughout the cervical spine, with associated disc space narrowings and osseous spurring. Most significant degenerative change at the C5-6 and C6-7 levels with complete loss of disc space height and associated osseous spurring, likely source for the overlying reversal of normal cervical spine lordosis. There is, however, no more than mild central canal stenosis  at any level. Degenerative hypertrophy of the uncovertebral and facet joints causing severe bilateral neural foramen stenoses at C3-4, severe bilateral neural foramen stenoses at C4-5, severe right neural foramen stenosis at C5-6 and moderate to severe bilateral neural foramen stenoses at C6-7. Suspect associated nerve root impingement at 1 or more levels. Upper chest: Mass versus consolidation at the left lung apex, incompletely imaged at the lower aspects of the chest CT. Other: Carotid atherosclerosis. IMPRESSION: 1. No acute intracranial abnormality. No intracranial mass, hemorrhage or edema. Chronic small Jordan ischemic changes in the white matter. 2. Focal soft tissue hematoma overlying the right frontal bone. No underlying fracture. 3. No acute facial bone fracture or displacement. Old healed fractures of the right zygoma and right lateral orbital wall. 4. No fracture or acute subluxation within the cervical spine. Degenerative changes of the cervical spine, at least moderate in degree, as detailed above. 5. Masslike density at the left lung apex, incompletely imaged at the lower aspects of this chest CT. This likely represents the upper aspect of the ectatic thoracic aortic arch, but would consider chest CT to confirm benignity. These results were called by telephone at the time of  interpretation on 02/11/2017 at 6:15 pm to Dr. Maryan Rued, who verbally acknowledged these results. Electronically Signed   By: Franki Cabot M.D.   On: 02/11/2017 18:18   Ct Chest W Contrast  Result Date: 02/11/2017 CLINICAL DATA:  Question of mass or prominent aortic arch at the left lung apex on CT of the cervical spine. Further evaluation requested. EXAM: CT CHEST WITH CONTRAST TECHNIQUE: Multidetector CT imaging of the chest was performed during intravenous contrast administration. 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 The dissection extends along the ascending thoracic aorta, aortic arch and proximal descending thoracic aorta, and there appears to be partial thrombosis of the false lumen. An apparent spontaneous fenestration is noted at the level of the proximal aortic arch. The great vessels appear to arise from the true lumen of the thoracic aorta. The dissection appears to resolve at the proximal descending thoracic aorta. This may reflect lower pressure than usual due to the combination of spontaneous fenestration and extravasation into the patient's apparent moderate hemopericardium. Mediastinum/Nodes: A moderate pericardial effusion is noted, with areas of mildly increased attenuation, suspicious for hemopericardium. There also appears to be a trace left-sided pleural effusion, with mildly increased attenuation, concerning for trace left-sided hemothorax. This likely reflects extension of hemorrhage across the aortic adventitia into the pericardial space and left pleural space. No definite acute contrast extravasation is identified. Lungs/Pleura: Minimal bilateral atelectasis is noted. No  pneumothorax is seen. As described above, there is suspicion of trace left-sided hemothorax. No masses are seen. Upper Abdomen: The visualized abdominal aorta remains normal in caliber. There is no evidence of aortic dissection along the abdominal aorta at this time. Scattered calcification is seen along the proximal abdominal aorta. The visualized portions of the liver and spleen are grossly unremarkable. The visualized portions of the gallbladder, pancreas, adrenal glands and kidneys are within normal limits. A tiny hiatal hernia is noted. Musculoskeletal: No acute osseous abnormalities are identified. Degenerative joint space narrowing is noted at the right glenohumeral joint, with apparent scattered loose bodies and underlying diffuse  joint space calcification. The visualized musculature is unremarkable in appearance. IMPRESSION: 1. 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 The dissection extends along the ascending thoracic aorta, aortic arch and proximal descending thoracic aorta, with partial thrombosis of the false lumen. Apparent spontaneous fenestration noted at the level of the proximal aortic arch. 2. Moderate pericardial effusion demonstrates areas of mildly increased attenuation, suspicious for hemopericardium. Trace left-sided pleural effusion also demonstrates mildly increased attenuation, concerning for trace left-sided hemothorax. This likely reflects extension of hemorrhage across the aortic adventitia to the pericardial space and left pleural space. No definite acute contrast extravasation seen at this time. 3. The dissection appears to resolve at the proximal descending thoracic aorta, and the distal descending thoracic aorta and abdominal aorta are unremarkable in appearance, aside from scattered aortic atherosclerosis. This may reflect lower pressure than usual due to the combination of  spontaneous fenestration and extravasation into the patient's apparent moderate hemopericardium. The great vessels arise from the true lumen. 4. Minimal bilateral atelectasis noted. 5. Degenerative change of the right glenohumeral joint, with scattered loose bodies and underlying diffuse joint space calcification. Critical Value/emergent results were called by telephone at the time of interpretation on 02/11/2017 at 10:44 pm to Dr. Blanchie Dessert, who verbally acknowledged these results. Electronically Signed   By: Garald Balding M.D.   On: 02/11/2017 22:59   Ct Cervical Spine Wo Contrast  Result Date: 02/11/2017 CLINICAL DATA:  Patient fell Wednesday and hit the right side of her head and face on curb. Swelling and bruising on right side around orbit and frontal region. Patient has previous history of loss of disk height in her cervical spine EXAM: CT HEAD WITHOUT CONTRAST CT MAXILLOFACIAL WITHOUT CONTRAST CT CERVICAL SPINE WITHOUT CONTRAST TECHNIQUE: Multidetector CT imaging of the head, cervical spine, and maxillofacial structures were performed using the standard protocol without intravenous contrast. Multiplanar CT image reconstructions of the cervical spine and maxillofacial structures were also generated. COMPARISON:  Head CT dated 02/10/2015. MRI of the cervical spine dated 07/20/2016. FINDINGS: CT HEAD FINDINGS Brain: There is mild generalized parenchymal atrophy with commensurate dilatation of the ventricles and sulci. Mild chronic small Jordan ischemic changes noted within the periventricular and subcortical white matter. There is no mass, hemorrhage, edema or other evidence of acute parenchymal abnormality. No extra-axial hemorrhage. Vascular: There are chronic calcified atherosclerotic changes of the large vessels at the skull base. No unexpected hyperdense Jordan. Skull: Chronic-appearing deformity of the right zygoma and right lateral orbital wall. No acute appearing fracture or displacement.  Other: Soft tissue hematoma overlying the right frontal bone, measuring approximately 1.7 x 1 cm. No underlying fracture. CT MAXILLOFACIAL FINDINGS Osseous: Lower frontal bones are intact and normally aligned. Old mild deformity of the right lateral orbital wall, stable compared to the earlier head CT from 2016. Osseous structures about the orbits are otherwise intact and normally aligned bilaterally. No displaced nasal bone fracture. Old healed fracture of the right zygoma. Left zygoma is intact and normally aligned. Bilateral pterygoid plates appear intact and normally aligned. Walls of the maxillary sinuses are intact and normally aligned bilaterally. Temporal bones appear intact and normally aligned. Orbits: Negative. No traumatic or inflammatory finding. Sinuses: Clear. Soft tissues: Unremarkable. CT CERVICAL SPINE FINDINGS Alignment: There is reversal of the normal cervical spine lordosis, likely related to the underlying degenerative changes, stable compared to the earlier MRI given patient positioning. No evidence  of acute vertebral body subluxation. Skull base and vertebrae: No fracture line or displaced fracture fragment identified. Soft tissues and spinal canal: No prevertebral fluid or swelling. No visible canal hematoma. Disc levels: Degenerative changes throughout the cervical spine, with associated disc space narrowings and osseous spurring. Most significant degenerative change at the C5-6 and C6-7 levels with complete loss of disc space height and associated osseous spurring, likely source for the overlying reversal of normal cervical spine lordosis. There is, however, no more than mild central canal stenosis at any level. Degenerative hypertrophy of the uncovertebral and facet joints causing severe bilateral neural foramen stenoses at C3-4, severe bilateral neural foramen stenoses at C4-5, severe right neural foramen stenosis at C5-6 and moderate to severe bilateral neural foramen stenoses at C6-7.  Suspect associated nerve root impingement at 1 or more levels. Upper chest: Mass versus consolidation at the left lung apex, incompletely imaged at the lower aspects of the chest CT. Other: Carotid atherosclerosis. IMPRESSION: 1. No acute intracranial abnormality. No intracranial mass, hemorrhage or edema. Chronic small Jordan ischemic changes in the white matter. 2. Focal soft tissue hematoma overlying the right frontal bone. No underlying fracture. 3. No acute facial bone fracture or displacement. Old healed fractures of the right zygoma and right lateral orbital wall. 4. No fracture or acute subluxation within the cervical spine. Degenerative changes of the cervical spine, at least moderate in degree, as detailed above. 5. Masslike density at the left lung apex, incompletely imaged at the lower aspects of this chest CT. This likely represents the upper aspect of the ectatic thoracic aortic arch, but would consider chest CT to confirm benignity. These results were called by telephone at the time of interpretation on 02/11/2017 at 6:15 pm to Dr. Maryan Rued, who verbally acknowledged these results. Electronically Signed   By: Franki Cabot M.D.   On: 02/11/2017 18:18   Ct Hip Right Wo Contrast  Result Date: 02/11/2017 CLINICAL DATA:  83 year old female with acute right hip pain following fall three days ago. Initial encounter. EXAM: CT OF THE RIGHT HIP WITHOUT CONTRAST TECHNIQUE: Multidetector CT imaging of the right hip was performed according to the standard protocol. Multiplanar CT image reconstructions were also generated. COMPARISON:  07/10/2006 renogram and 06/06/2006 chest CT. FINDINGS: Bones/Joint/Cartilage Nondisplaced fractures of the superior and inferior right pubic rami are noted. No other fracture, subluxation or dislocation identified. Ligaments Suboptimally assessed by CT. Soft tissues Again noted is chronic right UPJ obstruction with moderate to severe hydronephrosis. IMPRESSION: Nondisplaced  fractures of the superior and inferior right pubic rami. 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 Electronically Signed   By: Margarette Canada M.D.   On: 02/11/2017 18:36   Dg Chest Port 1 View  Result Date: 02/13/2017 CLINICAL DATA:  Chest pain EXAM: PORTABLE CHEST 1 VIEW COMPARISON:  Chest radiograph January 10, 2017 and chest CT Feb 11, 2017 FINDINGS: There is cardiac enlargement; recent chest for CT demonstrated pericardial effusion. Aorta is prominent; recent CT demonstrated aortic dissection. There is mild atelectasis in the right mid lung. Lungs elsewhere clear. No adenopathy. There is degenerative change in each shoulder with superior migration of each humeral head. IMPRESSION: Aortic prominence consistent with known thoracic aortic dissection. There is aortic atherosclerosis. There is cardiomegaly with known pericardial effusion. Mild right midlung atelectasis. No edema or consolidation. Chronic rotator cuff tears bilaterally. Electronically Signed   By: Lowella Grip III M.D.   On: 02/13/2017 07:14   Ct Maxillofacial Wo Contrast  Result Date:  02/11/2017 CLINICAL DATA:  Patient fell Wednesday and hit the right side of her head and face on curb. Swelling and bruising on right side around orbit and frontal region. Patient has previous history of loss of disk height in her cervical spine EXAM: CT HEAD WITHOUT CONTRAST CT MAXILLOFACIAL WITHOUT CONTRAST CT CERVICAL SPINE WITHOUT CONTRAST TECHNIQUE: Multidetector CT imaging of the head, cervical spine, and maxillofacial structures were performed using the standard protocol without intravenous contrast. Multiplanar CT image reconstructions of the cervical spine and maxillofacial structures were also generated. COMPARISON:  Head CT dated 02/10/2015. MRI of the cervical spine dated 07/20/2016. FINDINGS: CT HEAD FINDINGS Brain: There is mild generalized parenchymal atrophy with commensurate dilatation of the ventricles and sulci.  Mild chronic small Jordan ischemic changes noted within the periventricular and subcortical white matter. There is no mass, hemorrhage, edema or other evidence of acute parenchymal abnormality. No extra-axial hemorrhage. Vascular: There are chronic calcified atherosclerotic changes of the large vessels at the skull base. No unexpected hyperdense Jordan. Skull: Chronic-appearing deformity of the right zygoma and right lateral orbital wall. No acute appearing fracture or displacement. Other: Soft tissue hematoma overlying the right frontal bone, measuring approximately 1.7 x 1 cm. No underlying fracture. CT MAXILLOFACIAL FINDINGS Osseous: Lower frontal bones are intact and normally aligned. Old mild deformity of the right lateral orbital wall, stable compared to the earlier head CT from 2016. Osseous structures about the orbits are otherwise intact and normally aligned bilaterally. No displaced nasal bone fracture. Old healed fracture of the right zygoma. Left zygoma is intact and normally aligned. Bilateral pterygoid plates appear intact and normally aligned. Walls of the maxillary sinuses are intact and normally aligned bilaterally. Temporal bones appear intact and normally aligned. Orbits: Negative. No traumatic or inflammatory finding. Sinuses: Clear. Soft tissues: Unremarkable. CT CERVICAL SPINE FINDINGS Alignment: There is reversal of the normal cervical spine lordosis, likely related to the underlying degenerative changes, stable compared to the earlier MRI given patient positioning. No evidence of acute vertebral body subluxation. Skull base and vertebrae: No fracture line or displaced fracture fragment identified. Soft tissues and spinal canal: No prevertebral fluid or swelling. No visible canal hematoma. Disc levels: Degenerative changes throughout the cervical spine, with associated disc space narrowings and osseous spurring. Most significant degenerative change at the C5-6 and C6-7 levels with complete  loss of disc space height and associated osseous spurring, likely source for the overlying reversal of normal cervical spine lordosis. There is, however, no more than mild central canal stenosis at any level. Degenerative hypertrophy of the uncovertebral and facet joints causing severe bilateral neural foramen stenoses at C3-4, severe bilateral neural foramen stenoses at C4-5, severe right neural foramen stenosis at C5-6 and moderate to severe bilateral neural foramen stenoses at C6-7. Suspect associated nerve root impingement at 1 or more levels. Upper chest: Mass versus consolidation at the left lung apex, incompletely imaged at the lower aspects of the chest CT. Other: Carotid atherosclerosis. IMPRESSION: 1. No acute intracranial abnormality. No intracranial mass, hemorrhage or edema. Chronic small Jordan ischemic changes in the white matter. 2. Focal soft tissue hematoma overlying the right frontal bone. No underlying fracture. 3. No acute facial bone fracture or displacement. Old healed fractures of the right zygoma and right lateral orbital wall. 4. No fracture or acute subluxation within the cervical spine. Degenerative changes of the cervical spine, at least moderate in degree, as detailed above. 5. Masslike density at the left lung apex, incompletely imaged at the lower aspects of  this chest CT. This likely represents the upper aspect of the ectatic thoracic aortic arch, but would consider chest CT to confirm benignity. These results were called by telephone at the time of interpretation on 02/11/2017 at 6:15 pm to Dr. Maryan Rued, who verbally acknowledged these results. Electronically Signed   By: Franki Cabot M.D.   On: 02/11/2017 18:18    Cardiac Studies    2-D echocardiogram reviewed 02/10/2017: EF 55-60% with mild to moderate LVH. Dissection flap noted in the proximal ascending aorta false lumen seen. Max dimension 5.0 cm at least. There the abnormal moderately dilated aorta. Moderate MR.  Mild/moderate aortic regurgitation. Moderately increased pulmonary pressures symptomatically to 53 mmHg. Also moderate to large effusion with mild tamponade physiology.  Cardiac catheterization from 01/11/2017: Ostial RCA 50%, mid LAD 50%. EF 55-60%. - No culprit lesion to explain the patient's chest pain and positive troponins. Suspect A. fib RVR   Assessment & Plan    Principal Problem:   Ascending aortic dissection (HCC) Active Problems:   Ascending aortic aneurysm (HCC)   Pubic ramus fracture, right, closed, initial encounter ([place])   Hypertension   Longstanding persistent atrial fibrillation (HCC)   CAD (coronary artery disease): mild to moderate non-obstructive   Chronic pain   Physical deconditioning   Anemia   Pericardial effusion       Ascending aortic aneurysm (HCC) / Ascending aortic dissection (HCC) / Pericardial effusion  Patient has been seen in follow-up with Dr. Roxy Manns. Plan is continue whole ELIQUIS with plan to go to the OR on Wednesday for pericardial window with aortic root replacement. Possible aortic valve repair.  For now the plan is to continue blood pressure control - on low-dose labetalol with adequate blood pressure control. No more hypotension  Monitor for signs of tamponade. Currently asymptomatic.  Active Problems:    Pubic ramus fracture, right, closed, initial encounter [facility] - plan is for expectant/medical management. Seen by orthopedics with plans for physical therapy. Dr. Roxy Manns would like for her to have some rehabilitation done prior to surgery.    Hypertension - was on Bystolic at home. Currently now on labetalol drip maintaining stable pressures. Would convert back to Bystolic as an outpatient    Longstanding persistent atrial fibrillation ([place]) - previously managed by her PCP. She is on digoxin for rate control and on labetalol drip. Stable rate.  ELIQUIS on hold for planned surgery. Restart when safe postoperatively    CAD (coronary artery  disease): mild to moderate non-obstructive: Recent cath with nonobstructive disease is reassuring based on her need for urgent surgery  On atorvastatin, and beta blocker.    Physical deconditioning: She has had several events over the last month or so that has left her somewhat deconditioned. She also is comminuted by chronic pain syndrome. Will need rehabilitation prior to her surgery.  Will likely be difficult postop   Anemia - hemoglobin stable over 2 days now. No longer on anticoagulation. Follow perioperatively.   Signed, Glenetta Hew, MD  02/13/2017, 9:12 AM

## 2017-02-13 NOTE — Progress Notes (Signed)
      [address]       [place] [address]             [phone]        CARDIOTHORACIC SURGERY PROGRESS NOTE  Subjective: Feels well.  Had a good night.  Mild right hip pain.  Mild chronic pain in neck.  No chest pain or SOB.  Hungry for breakfast  Objective: Vital signs: BP Readings from Last 1 Encounters:  02/13/17 120/79   Pulse Readings from Last 1 Encounters:  02/13/17 60   Resp Readings from Last 1 Encounters:  02/13/17 14   Temp Readings from Last 1 Encounters:  02/13/17 97.5 F (36.4 C) (Oral)    Hemodynamics:    Physical Exam:  Rhythm:   sinus  Breath sounds: clear  Heart sounds:  RRR  Incisions:  n/a  Abdomen:  soft  Extremities:  warm   Intake/Output from previous day: 05/13 0701 - 05/14 0700 In: 243.7 [I.V.:243.7] Out: 651 [Urine:651] Intake/Output this shift: No intake/output data recorded.  Lab Results:  CBC: Recent Labs  02/12/17 0606 02/13/17 0227  WBC 10.2 10.0  HGB 8.9* 8.9*  HCT 27.1* 27.3*  PLT 390 440*    BMET:  Recent Labs  02/12/17 0606 02/13/17 0227  NA 131* 132*  K 4.1 4.3  CL 98* 99*  CO2 25 25  GLUCOSE 113* 111*  BUN 14 14  CREATININE 0.81 0.88  CALCIUM 8.7* 8.8*     PT/INR:   Recent Labs  02/13/17 0227  LABPROT 18.2*  INR 1.49    CBG (last 3)  No results for input(s): GLUCAP in the last 72 hours.  ABG    Component Value Date/Time   TCO2 26 02/11/2017 1931    CXR: PORTABLE CHEST 1 VIEW  COMPARISON:  Chest radiograph January 10, 2017 and chest CT Feb 11, 2017  FINDINGS: There is cardiac enlargement; recent chest for CT demonstrated pericardial effusion. Aorta is prominent; recent CT demonstrated aortic dissection. There is mild atelectasis in the right mid lung. Lungs elsewhere clear. No adenopathy. There is degenerative change in each shoulder with superior migration of each humeral head.  IMPRESSION: Aortic prominence consistent with known thoracic aortic  dissection. There is aortic atherosclerosis. There is cardiomegaly with known pericardial effusion. Mild right midlung atelectasis. No edema or consolidation. Chronic rotator cuff tears bilaterally.   Electronically Signed   By: Lowella Grip III M.D.   On: 02/13/2017 07:14  Assessment/Plan:  Clinically very stable.  Patient remains asymptomatic w/ respect to her aortic dissection and pericardial effusion.  BP well controlled.  Plan for OR on Wednesday.  Will ask for PT consult to assist with mobilization and education regarding management of pubic ramus fracture as well as anticipated needs following upcoming surgery.  Keep in ICU until surgery for close observation.  I spent in excess of 15 minutes during the conduct of this hospital encounter and >50% of this time involved direct face-to-face encounter with the patient for counseling and/or coordination of their care.    [name], MD 02/13/2017 8:34 AM

## 2017-02-13 NOTE — Evaluation (Signed)
Physical Therapy Evaluation Patient Details Name: Lydia Jordan MRN: [id number] DOB: 1934-01-30 Today's Date: 02/13/2017   History of Present Illness  83 y.o. female with a history of chronic atrial fibrillation on ELIQUIS, recent non-STEMI with minimal CAD as well as hypertension and hypothyroidism who presented after mechanical fall on May 19. She suffered a pelvic fracture. During this evaluation she had a CT scan which revealed a type a aortic dissection complicated by pericardial effusion.  Clinical Impression  Patient presents with decreased mobility due to pain, general weakness, limited activity tolerance, pain and planned surgery for dissecting aortic aneurysm planned for 5/16.  She currently needs min A for bed to chair mobility and previously was independent and enjoyed working in her yard.  She will benefit from skilled PT in the acute setting to allow return home following possible need for SNF level rehab.     Follow Up Recommendations SNF    Equipment Recommendations  None recommended by PT    Recommendations for Other Services       Precautions / Restrictions Precautions Precautions: Fall Restrictions Weight Bearing Restrictions: Yes RLE Weight Bearing: Weight bearing as tolerated      Mobility  Bed Mobility Overal bed mobility: Needs Assistance Bed Mobility: Supine to Sit     Supine to sit: HOB elevated;Min assist     General bed mobility comments: assist with R LE and to scoot to EOB  Transfers Overall transfer level: Needs assistance Equipment used: Rolling walker (2 wheeled) Transfers: Sit to/from [facility] Sit to Stand: Min assist;+2 safety/equipment Stand pivot transfers: Min assist       General transfer comment: assist for balance, cue for hand placement; used walker to take steps to chair with cues for WBAT  Ambulation/Gait                Stairs            Wheelchair Mobility    Modified Rankin (Stroke  Patients Only)       Balance Overall balance assessment: Needs assistance   Sitting balance-Leahy Scale: Fair     Standing balance support: Bilateral upper extremity supported Standing balance-Leahy Scale: Poor Standing balance comment: UE support and min A for balance                             Pertinent Vitals/Pain Pain Assessment: 0-10 Pain Score: 7  Pain Location: R hip and chronic neck pain Pain Descriptors / Indicators: Aching;Sore Pain Intervention(s): Monitored during session;Repositioned    Home Living Family/patient expects to be discharged to:: Private residence Living Arrangements: Children Available Help at Discharge: Family;Available 24 hours/day (daughter owns her own business) Type of Home: House Home Access: Level entry     Home Layout: One level Home Equipment: Environmental consultant - 4 wheels;Shower seat;Hand held shower head;Shower seat - built in;Grab bars - tub/shower      Prior Function Level of Independence: Independent               Hand Dominance        Extremity/Trunk Assessment   Upper Extremity Assessment Upper Extremity Assessment: Generalized weakness    Lower Extremity Assessment Lower Extremity Assessment: RLE deficits/detail;LLE deficits/detail RLE Deficits / Details: AAROM WFL, strength limited due to pain with lifting from hip, also educated to limit hip abduction to avoid increased pain LLE Deficits / Details: AROM WFL, strength hip flexion 4-/5, knee extension 4+/5    Cervical /  Trunk Assessment Cervical / Trunk Assessment: Kyphotic  Communication   Communication: No difficulties  Cognition Arousal/Alertness: Awake/alert Behavior During Therapy: WFL for tasks assessed/performed;Anxious Overall Cognitive Status: Within Functional Limits for tasks assessed                                        General Comments General comments (skin integrity, edema, etc.): bruising over R eye    Exercises  General Exercises - Lower Extremity Ankle Circles/Pumps: AROM;Both;5 reps;Supine Heel Slides: AAROM;Right;5 reps;Supine   Assessment/Plan    PT Assessment Patient needs continued PT services  PT Problem List Decreased strength;Decreased activity tolerance;Decreased balance;Pain;Decreased knowledge of use of DME;Cardiopulmonary status limiting activity;Decreased mobility;Decreased safety awareness       PT Treatment Interventions DME instruction;Gait training;Balance training;Therapeutic exercise;Patient/family education;Therapeutic activities;Stair training;Functional mobility training    PT Goals (Current goals can be found in the Care Plan section)  Acute Rehab PT Goals Patient Stated Goal: To return to independent PT Goal Formulation: With patient Time For Goal Achievement: 02/20/17 Potential to Achieve Goals: Fair    Frequency Min 3X/week   Barriers to discharge        Co-evaluation               AM-PAC PT "6 Clicks" Daily Activity  Outcome Measure Difficulty turning over in bed (including adjusting bedclothes, sheets and blankets)?: Total Difficulty moving from lying on back to sitting on the side of the bed? : Total Difficulty sitting down on and standing up from a chair with arms (e.g., wheelchair, bedside commode, etc,.)?: Total Help needed moving to and from a bed to chair (including a wheelchair)?: A Little Help needed walking in hospital room?: A Little Help needed climbing 3-5 steps with a railing? : A Lot 6 Click Score: 11    End of Session Equipment Utilized During Treatment: Gait belt Activity Tolerance: Patient limited by pain Patient left: in chair;with call bell/phone within reach;with family/visitor present   PT Visit Diagnosis: Other abnormalities of gait and mobility (R26.89);History of falling (Z91.81);Muscle weakness (generalized) (M62.81);Pain Pain - Right/Left: Right Pain - part of body: Hip    Time: 8527-7824 PT Time Calculation (min)  (ACUTE ONLY): 23 min   Charges:   PT Evaluation $PT Eval Moderate Complexity: 1 Procedure PT Treatments $Therapeutic Activity: 8-22 mins   PT G CodesMagda Kiel, [place] [phone] 02/13/2017   Reginia Naas 02/13/2017, 10:17 AM

## 2017-02-13 NOTE — Clinical Social Work Note (Signed)
Clinical Social Work Assessment  Patient Details  Name: Lydia Jordan MRN: [id number] Date of Birth: 1934-10-02  Date of referral:  02/13/17               Reason for consult:  Facility Placement                Permission sought to share information with:  Chartered certified accountant granted to share information::  Yes, Verbal Permission Granted  Name::     Holiday representative::  SNF  Relationship::  dtr  Contact Information:     Housing/Transportation Living arrangements for the past 2 months:  [place] of Information:  Patient, Adult Children Patient Interpreter Needed:  None Criminal Activity/Legal Involvement Pertinent to Current Situation/Hospitalization:  No - Comment as needed Significant Relationships:  None Lives with:  Self Do you feel safe going back to the place where you live?  No Need for family participation in patient care:  Yes (Comment) (intermitent supervision)  Care giving concerns:  Pt lives in apartment attached to her dtrs house- usually independent with ADL and day to day living but has supervision from dtr when needed and has pager for if she needs assistance at home.  After recent fall and upcoming surgery patient does not think she could return home successfully.   Social Worker assessment / plan:  CSW spoke with pt and pt dtr concerning PT recommendation for SNF.  Explained SNF and SNF referral process and provided list of available SNFs.  Employment status:  Retired Forensic scientist:  Medicare PT Recommendations:  [place] / Referral to community resources:  [place]  Patient/Family's Response to care:  Pt agreeable to SNF if needed and agreeable to speak with CSW again following surgery to discuss options and level of care needed.  Patient/Family's Understanding of and Emotional Response to Diagnosis, Current Treatment, and Prognosis:  Pt expresses good understanding of  current condition and upcoming surgery.  Emotional Assessment Appearance:  Appears stated age Attitude/Demeanor/Rapport:    Affect (typically observed):  Appropriate Orientation:  Oriented to Self, Oriented to Place, Oriented to  Time, Oriented to Situation Alcohol / Substance use:  Not Applicable Psych involvement (Current and /or in the community):  No (Comment)  Discharge Needs  Concerns to be addressed:  Care Coordination Readmission within the last 30 days:  No Current discharge risk:  Physical Impairment Barriers to Discharge:  Continued Medical Work up   Jorge Ny, LCSW 02/13/2017, 4:10 PM

## 2017-02-13 NOTE — Progress Notes (Signed)
Discussed sternal precautions, IS (inspiring 1100 mL), mobility post op and d/c planning. Pt and family voiced understanding.  Discussed the probable need for SNF at d/c. Gave materials to read and view preop video. Pt and family receptive. Pt not ambulating yet therefore we will await f/u after surgery. [place], ACSM 2:36 PM 02/13/2017

## 2017-02-13 NOTE — Care Management Note (Signed)
Case Management Note Marvetta Gibbons RN, BSN Unit 2W-Case Manager [phone]  Patient Details  Name: Lydia Jordan MRN: [id number] Date of Birth: Nov 27, 1933  Subjective/Objective:  Pt admitted with Type A Aortic dissection- pt had mechanical fall on 5/9- with pubic ramus fx- per ortho will tx non surgical with WBAT- plan for pt to go to OR on 5/16- for Aortic dissection repair.                    Action/Plan: PTA pt lived at home-house place that is joined to daughters home. Pt limited in mobility- per PT eval pre-op recommendation for SNF- CSW has been consulted for SNF needs.   Expected Discharge Date:                  Expected Discharge Plan:  Skilled Nursing Facility  In-House Referral:  Clinical Social Work  Discharge planning Services  CM Consult  Post Acute Care Choice:    Choice offered to:     DME Arranged:    DME Agency:     HH Arranged:    [place] Agency:     Status of Service:  In process, will continue to follow  If discussed at Long Length of Stay Meetings, dates discussed:    Discharge Disposition:   Additional Comments:  Dawayne Patricia, RN 02/13/2017, 10:23 AM

## 2017-02-13 NOTE — Progress Notes (Signed)
CT surgery p.m. Rounds Patient denies chest pain Patient breathing comfortably Patient was up taking a few steps in the room with rolling walker today Stable day

## 2017-02-14 DIAGNOSIS — I10 Essential (primary) hypertension: Secondary | ICD-10-CM

## 2017-02-14 DIAGNOSIS — I481 Persistent atrial fibrillation: Secondary | ICD-10-CM

## 2017-02-14 DIAGNOSIS — D508 Other iron deficiency anemias: Secondary | ICD-10-CM

## 2017-02-14 DIAGNOSIS — I712 Thoracic aortic aneurysm, without rupture: Secondary | ICD-10-CM

## 2017-02-14 DIAGNOSIS — I2510 Atherosclerotic heart disease of native coronary artery without angina pectoris: Secondary | ICD-10-CM

## 2017-02-14 DIAGNOSIS — I313 Pericardial effusion (noninflammatory): Secondary | ICD-10-CM

## 2017-02-14 LAB — APTT: aPTT: 69 seconds — ABNORMAL HIGH (ref 24–36)

## 2017-02-14 LAB — PROTIME-INR
INR: 1.37
Prothrombin Time: 16.9 seconds — ABNORMAL HIGH (ref 11.4–15.2)

## 2017-02-14 LAB — HEMOGLOBIN A1C
HEMOGLOBIN A1C: 5.5 % (ref 4.8–5.6)
MEAN PLASMA GLUCOSE: 111 mg/dL

## 2017-02-14 MED ORDER — SODIUM CHLORIDE 0.9 % IV SOLN
INTRAVENOUS | Status: AC
  Administered 2017-02-15: .7 [IU]/h via INTRAVENOUS
  Filled 2017-02-14 (×2): qty 1

## 2017-02-14 MED ORDER — POLYETHYLENE GLYCOL 3350 17 G PO PACK
17.0000 g | PACK | Freq: Every day | ORAL | Status: DC
  Administered 2017-02-14: 17 g via ORAL
  Filled 2017-02-14: qty 1

## 2017-02-14 MED ORDER — DOPAMINE-DEXTROSE 3.2-5 MG/ML-% IV SOLN
0.0000 ug/kg/min | INTRAVENOUS | Status: AC
  Administered 2017-02-15: 5 ug/kg/min via INTRAVENOUS
  Filled 2017-02-14: qty 250

## 2017-02-14 MED ORDER — DEXTROSE 5 % IV SOLN
1.5000 g | INTRAVENOUS | Status: AC
  Administered 2017-02-15: 1.5 g via INTRAVENOUS
  Administered 2017-02-15: .75 g via INTRAVENOUS
  Filled 2017-02-14 (×2): qty 1.5

## 2017-02-14 MED ORDER — VANCOMYCIN HCL 10 G IV SOLR
1250.0000 mg | INTRAVENOUS | Status: AC
  Administered 2017-02-15: 1250 mg via INTRAVENOUS
  Filled 2017-02-14 (×2): qty 1250

## 2017-02-14 MED ORDER — VANCOMYCIN HCL 1000 MG IV SOLR
INTRAVENOUS | Status: AC
  Administered 2017-02-15: 1000 mL
  Filled 2017-02-14: qty 1000

## 2017-02-14 MED ORDER — BISACODYL 5 MG PO TBEC
5.0000 mg | DELAYED_RELEASE_TABLET | Freq: Once | ORAL | Status: AC
  Administered 2017-02-14: 5 mg via ORAL
  Filled 2017-02-14: qty 1

## 2017-02-14 MED ORDER — MAGNESIUM SULFATE 50 % IJ SOLN
40.0000 meq | INTRAMUSCULAR | Status: DC
  Filled 2017-02-14 (×2): qty 10

## 2017-02-14 MED ORDER — CHLORHEXIDINE GLUCONATE 4 % EX LIQD
60.0000 mL | Freq: Once | CUTANEOUS | Status: AC
  Administered 2017-02-15: 4 via TOPICAL
  Filled 2017-02-14: qty 60

## 2017-02-14 MED ORDER — CHLORHEXIDINE GLUCONATE 0.12 % MT SOLN
15.0000 mL | Freq: Once | OROMUCOSAL | Status: AC
  Administered 2017-02-15: 15 mL via OROMUCOSAL
  Filled 2017-02-14: qty 15

## 2017-02-14 MED ORDER — TRANEXAMIC ACID 1000 MG/10ML IV SOLN
1.5000 mg/kg/h | INTRAVENOUS | Status: AC
  Administered 2017-02-15: 1.5 mg/kg/h via INTRAVENOUS
  Filled 2017-02-14 (×2): qty 25

## 2017-02-14 MED ORDER — POTASSIUM CHLORIDE 2 MEQ/ML IV SOLN
80.0000 meq | INTRAVENOUS | Status: DC
  Filled 2017-02-14 (×2): qty 40

## 2017-02-14 MED ORDER — LIDOCAINE HCL (CARDIAC) 20 MG/ML IV SOLN
260.0000 mg | INTRAVENOUS | Status: DC
  Filled 2017-02-14 (×2): qty 13

## 2017-02-14 MED ORDER — PHENYLEPHRINE HCL 10 MG/ML IJ SOLN
30.0000 ug/min | INTRAMUSCULAR | Status: DC
  Filled 2017-02-14 (×4): qty 2

## 2017-02-14 MED ORDER — MANNITOL 20 % IV SOLN
6.4000 g | INTRAVENOUS | Status: DC
  Filled 2017-02-14 (×5): qty 250

## 2017-02-14 MED ORDER — TRANEXAMIC ACID (OHS) PUMP PRIME SOLUTION
2.0000 mg/kg | INTRAVENOUS | Status: DC
  Filled 2017-02-14 (×2): qty 1.19

## 2017-02-14 MED ORDER — TEMAZEPAM 15 MG PO CAPS
15.0000 mg | ORAL_CAPSULE | Freq: Once | ORAL | Status: AC | PRN
  Administered 2017-02-14: 15 mg via ORAL
  Filled 2017-02-14: qty 1

## 2017-02-14 MED ORDER — DEXTROSE 5 % IV SOLN
750.0000 mg | INTRAVENOUS | Status: DC
  Filled 2017-02-14 (×2): qty 750

## 2017-02-14 MED ORDER — PLASMA-LYTE 148 IV SOLN
INTRAVENOUS | Status: AC
  Administered 2017-02-15: 500 mL
  Filled 2017-02-14 (×2): qty 2.5

## 2017-02-14 MED ORDER — METOPROLOL TARTRATE 12.5 MG HALF TABLET
12.5000 mg | ORAL_TABLET | Freq: Once | ORAL | Status: AC
  Administered 2017-02-15: 12.5 mg via ORAL
  Filled 2017-02-14: qty 1

## 2017-02-14 MED ORDER — DEXMEDETOMIDINE HCL IN NACL 400 MCG/100ML IV SOLN
0.1000 ug/kg/h | INTRAVENOUS | Status: AC
  Administered 2017-02-15: .2 ug/kg/h via INTRAVENOUS
  Filled 2017-02-14 (×2): qty 100

## 2017-02-14 MED ORDER — NITROGLYCERIN IN D5W 200-5 MCG/ML-% IV SOLN
2.0000 ug/min | INTRAVENOUS | Status: AC
  Administered 2017-02-15: 5 ug/min via INTRAVENOUS
  Filled 2017-02-14: qty 250

## 2017-02-14 MED ORDER — TRANEXAMIC ACID (OHS) BOLUS VIA INFUSION
15.0000 mg/kg | INTRAVENOUS | Status: AC
  Administered 2017-02-15: 895.5 mg via INTRAVENOUS
  Filled 2017-02-14 (×2): qty 896

## 2017-02-14 MED ORDER — SODIUM CHLORIDE 0.9 % IV SOLN
INTRAVENOUS | Status: DC
  Filled 2017-02-14 (×2): qty 30

## 2017-02-14 MED ORDER — EPINEPHRINE PF 1 MG/ML IJ SOLN
0.0000 ug/min | INTRAMUSCULAR | Status: DC
  Filled 2017-02-14 (×2): qty 4

## 2017-02-14 MED ORDER — ORAL CARE MOUTH RINSE: 15.0000 mL | Freq: Two times a day (BID) | OROMUCOSAL | Status: DC

## 2017-02-14 MED ORDER — CHLORHEXIDINE GLUCONATE 4 % EX LIQD
60.0000 mL | Freq: Once | CUTANEOUS | Status: AC
  Administered 2017-02-14: 4 via TOPICAL
  Filled 2017-02-14: qty 60

## 2017-02-14 NOTE — Progress Notes (Signed)
Physical Therapy Treatment Patient Details Name: Lydia Jordan MRN: [id number] DOB: 03-17-1934 Today's Date: 02/14/2017    History of Present Illness 83 y.o. female with a history of chronic atrial fibrillation on ELIQUIS, recent non-STEMI with minimal CAD as well as hypertension and hypothyroidism who presented after mechanical fall on May 19. She suffered a pelvic fracture. During this evaluation she had a CT scan which revealed a type a aortic dissection complicated by pericardial effusion.    PT Comments    Patient progressing with mobility, able to walk around bed in the room.  Still limited by pain and some SOB.  For aneurysm repair tomorrow.  Will continue skilled PT in the acute setting until d/c.    Follow Up Recommendations  SNF     Equipment Recommendations  None recommended by PT    Recommendations for Other Services       Precautions / Restrictions Precautions Precautions: Fall Restrictions Weight Bearing Restrictions: Yes RLE Weight Bearing: Weight bearing as tolerated    Mobility  Bed Mobility Overal bed mobility: Needs Assistance Bed Mobility: Sit to Supine       Sit to supine: Mod assist   General bed mobility comments: assist for legs into bed and positioning  Transfers Overall transfer level: Needs assistance Equipment used: Rolling walker (2 wheeled) Transfers: Sit to/from Stand Sit to Stand: Min assist         General transfer comment: cues for hand placement, assist to rise/for balance  Ambulation/Gait Ambulation/Gait assistance: Min assist;+2 safety/equipment Ambulation Distance (Feet): 12 Feet Assistive device: Rolling walker (2 wheeled) Gait Pattern/deviations: Step-to pattern;Decreased step length - left;Antalgic     General Gait Details: demonstrated technique for WBAT with pain on R, pt able to ambulate around bed with RW and assist for balance, safety, and equipment   Stairs            Wheelchair Mobility     Modified Rankin (Stroke Patients Only)       Balance Overall balance assessment: Needs assistance   Sitting balance-Leahy Scale: Fair     Standing balance support: Bilateral upper extremity supported Standing balance-Leahy Scale: Poor Standing balance comment: UE support and min A for balance                            Cognition Arousal/Alertness: Awake/alert Behavior During Therapy: WFL for tasks assessed/performed Overall Cognitive Status: Within Functional Limits for tasks assessed                                        Exercises General Exercises - Lower Extremity Ankle Circles/Pumps: AROM;Both;Supine;10 reps Heel Slides: AROM;Both;5 reps;Seated    General Comments General comments (skin integrity, edema, etc.): reports likely leaving early AM for surgery      Pertinent Vitals/Pain Pain Assessment: 0-10 Pain Score: 6  Pain Location: R hip and chronic neck pain Pain Descriptors / Indicators: Aching;Sore Pain Intervention(s): Monitored during session;Repositioned;Limited activity within patient's tolerance    Home Living                      Prior Function            PT Goals (current goals can now be found in the care plan section) Progress towards PT goals: Progressing toward goals    Frequency    Min 3X/week  PT Plan Current plan remains appropriate    Co-evaluation              AM-PAC PT "6 Clicks" Daily Activity  Outcome Measure  Difficulty turning over in bed (including adjusting bedclothes, sheets and blankets)?: A Lot Difficulty moving from lying on back to sitting on the side of the bed? : Total Difficulty sitting down on and standing up from a chair with arms (e.g., wheelchair, bedside commode, etc,.)?: Total Help needed moving to and from a bed to chair (including a wheelchair)?: A Little Help needed walking in hospital room?: A Little Help needed climbing 3-5 steps with a railing? : A  Lot 6 Click Score: 12    End of Session Equipment Utilized During Treatment: Gait belt;Oxygen Activity Tolerance: Patient limited by pain Patient left: with call bell/phone within reach;in bed;with bed alarm set;with family/visitor present   PT Visit Diagnosis: Other abnormalities of gait and mobility (R26.89);History of falling (Z91.81);Muscle weakness (generalized) (M62.81);Pain Pain - Right/Left: Right Pain - part of body: Hip     Time: 1120-1145 PT Time Calculation (min) (ACUTE ONLY): 25 min  Charges:  $Gait Training: 8-22 mins $Therapeutic Exercise: 8-22 mins                    G CodesMagda Jordan, [place] [phone] 02/14/2017    Lydia Jordan 02/14/2017, 3:19 PM

## 2017-02-14 NOTE — Progress Notes (Signed)
Progress Note  Patient Name: Lydia Jordan Date of Encounter: 02/14/2017  Primary Cardiologist: Recently became the patient for Dr. Acie Fredrickson  Patient Profile     83 y.o. female with a history of chronic atrial fibrillation on ELIQUIS, recent non-STEMI with minimal CAD as well as hypertension and hypothyroidism who presented after mechanical fall on May 19. She suffered a pelvic fracture. During this evaluation she had a CT scan which revealed a type a aortic dissection complicated by pericardial effusion.  Subjective   Feels little bit better today. Had a rough night because of discomfort sleeping with her bruising. Currently sitting up in a chair when I saw her in examination his morning. Just did not feel like eating much. She did walk some yesterday.  No symptoms of rapid irregular heartbeat/palpitations. No PND orthopnea or chest pain.  Inpatient Medications    Scheduled Meds: . atorvastatin  20 mg Oral Daily  . [START ON 02/15/2017] chlorhexidine  60 mL Topical Once  . [START ON 02/15/2017] chlorhexidine  15 mL Mouth/Throat Once  . digoxin  0.125 mg Oral q morning - 10a  . DULoxetine  30 mg Oral QHS  . DULoxetine  60 mg Oral Daily  . [START ON 02/15/2017] heparin-papaverine-plasmalyte irrigation   Irrigation To OR  . levothyroxine  100 mcg Oral QAC breakfast  . [START ON 02/15/2017] lidocaine (cardiac) 100 mg/89ml  260 mg Intravenous To OR  . [START ON 02/15/2017] magnesium sulfate  40 mEq Other To OR  . mouth rinse  15 mL Mouth Rinse BID  . [START ON 02/15/2017] metoprolol tartrate  12.5 mg Oral Once  . pantoprazole  80 mg Oral Daily  . polyethylene glycol  17 g Oral Daily  . [START ON 02/15/2017] potassium chloride  80 mEq Other To OR  . sodium chloride flush  3 mL Intravenous Q12H  . [START ON 02/15/2017] tranexamic acid  15 mg/kg Intravenous To OR  . [START ON 02/15/2017] tranexamic acid  2 mg/kg Intracatheter To OR  . [START ON 02/15/2017] vancomycin 1000 mg in NS (1000 ml)  irrigation for Dr. Roxy Manns case   Irrigation To OR   Continuous Infusions: . sodium chloride 10 mL/hr at 02/13/17 0800  . [START ON 02/15/2017] cefUROXime (ZINACEF)  IV    . [START ON 02/15/2017] cefUROXime (ZINACEF)  IV    . [START ON 02/15/2017] dexmedetomidine    . [START ON 02/15/2017] DOPamine    . [START ON 02/15/2017] epinephrine    . [START ON 02/15/2017] heparin 30,000 units/NS 1000 mL solution for CELLSAVER    . [START ON 02/15/2017] insulin (NOVOLIN-R) infusion    . labetalol (NORMODYNE) infusion 0.125 mg/min (02/14/17 0204)  . [START ON 02/15/2017] mannitol    . [START ON 02/15/2017] nitroGLYCERIN    . [START ON 02/15/2017] phenylephrine 20mg /269mL NS (0.08mg /ml) infusion    . [START ON 02/15/2017] tranexamic acid (CYKLOKAPRON) infusion ([facility])    . [START ON 02/15/2017] vancomycin     PRN Meds: Place/Maintain arterial line **AND** sodium chloride, oxyCODONE-acetaminophen   Vital Signs    Vitals:   02/14/17 2000 02/14/17 2100 02/14/17 2200 02/14/17 2300  BP: 140/84 119/60 131/76 111/77  Pulse:   72 60  Resp: 18 17 (!) 28 14  Temp:      TempSrc:      SpO2:   100% 98%  Weight:      Height:        Intake/Output Summary (Last 24 hours) at 02/14/17 2307 Last data filed  at 02/14/17 1800  Gross per 24 hour  Intake              480 ml  Output              745 ml  Net             -265 ml   Filed Weights   02/12/17 0600 02/13/17 0500 02/14/17 0400  Weight: 134 lb 0.6 oz (60.8 kg) 131 lb 13.4 oz (59.8 kg) 131 lb 9.8 oz (59.7 kg)    Telemetry    Atrial fibrillation with ranging rates in the 70s to 80s(Yesterday had some rates ranging in the 120s in the afternoon roughly 6:54 PM) - Personally Reviewed  ECG    NA - Personally Reviewed  Physical Exam   Physical Exam  Constitutional: She appears healthy.  Mildly ill-appearing, but nontoxic. Pleasant mood and affect  HENT:  Nose: Nose normal.  Mouth/Throat: Oropharynx is clear.  Large ecchymosis along the right forehead and  side of the face.  Eyes: Conjunctivae are normal. Pupils are equal, round, and reactive to light.  Neck: Normal range of motion and thyroid normal. Neck supple. No JVD present.  Cardiovascular: Normal rate, S1 normal, S2 normal and intact distal pulses.  An irregularly irregular rhythm present. PMI is not displaced.  Exam reveals distant heart sounds. Exam reveals no gallop, no friction rub and no midsystolic click.   Murmur heard.  Low-pitched rumbling holosystolic murmur is present with a grade of 1/6  at the apex Pulmonary/Chest: Breath sounds normal. Increased effort noted. She has no wheezes. She has no rales. She does have tenderness along the right rib cage and flank from her fall  Abdominal: Soft. Bowel sounds are normal. She exhibits no distension and no mass. There is no splenomegaly or hepatomegaly. There is no tenderness. No hernia.  Musculoskeletal: She exhibits tenderness (Along the right leg and pelvic area). She exhibits no edema or deformity.  Pelvic pain mostly on the right side in the pubic area as well as along the side of her leg. Large bruise noted.  Neurological: She is alert and oriented to person, place, and time.  Skin: Skin is warm and dry.    Labs    Chemistry  Recent Labs Lab 02/11/17 1931 02/12/17 0606 02/13/17 0227  NA 133* 131* 132*  K 5.0 4.1 4.3  CL 98* 98* 99*  CO2  --  25 25  GLUCOSE 106* 113* 111*  BUN 20 14 14   CREATININE 0.80 0.81 0.88  CALCIUM  --  8.7* 8.8*  PROT  --   --  6.5  ALBUMIN  --   --  2.9*  AST  --   --  16  ALT  --   --  13*  ALKPHOS  --   --  95  BILITOT  --   --  0.8  GFRNONAA  --  >60 60*  GFRAA  --  >60 >60  ANIONGAP  --  8 8     Hematology  Recent Labs Lab 02/11/17 1931 02/12/17 0606 02/13/17 0227  WBC  --  10.2 10.0  RBC  --  2.87* 2.90*  HGB 10.2* 8.9* 8.9*  HCT 30.0* 27.1* 27.3*  MCV  --  94.4 94.1  MCH  --  31.0 30.7  MCHC  --  32.8 32.6  RDW  --  15.0 15.1  PLT  --  390 440*    Cardiac EnzymesNo  results for input(s): TROPONINI  in the last 168 hours.   Recent Labs Lab 02/11/17 2308  TROPIPOC 0.00     BNP  Recent Labs Lab 02/13/17 0228  BNP 276.7*     DDimer No results for input(s): DDIMER in the last 168 hours.   Radiology    Dg Chest Port 1 View  Result Date: 02/13/2017 CLINICAL DATA:  Chest pain EXAM: PORTABLE CHEST 1 VIEW COMPARISON:  Chest radiograph January 10, 2017 and chest CT Feb 11, 2017 FINDINGS: There is cardiac enlargement; recent chest for CT demonstrated pericardial effusion. Aorta is prominent; recent CT demonstrated aortic dissection. There is mild atelectasis in the right mid lung. Lungs elsewhere clear. No adenopathy. There is degenerative change in each shoulder with superior migration of each humeral head. IMPRESSION: Aortic prominence consistent with known thoracic aortic dissection. There is aortic atherosclerosis. There is cardiomegaly with known pericardial effusion. Mild right midlung atelectasis. No edema or consolidation. Chronic rotator cuff tears bilaterally. Electronically Signed   By: Lowella Grip III M.D.   On: 02/13/2017 07:14    Cardiac Studies    2-D echocardiogram reviewed 02/12/2017: EF 55-60% with mild to moderate LVH. Dissection flap noted in the proximal ascending aorta false lumen seen. Max dimension 5.0 cm at least. There the abnormal moderately dilated aorta. Moderate MR. Mild/moderate aortic regurgitation. Moderately increased pulmonary pressures symptomatically to 53 mmHg. Also moderate to large effusion with mild tamponade physiology.  Cardiac catheterization from 01/11/2017: Ostial RCA 50%, mid LAD 50%. EF 55-60%. - No culprit lesion to explain the patient's chest pain and positive troponins. Suspect A. fib RVR   Assessment & Plan    Principal Problem:   Ascending aortic dissection (HCC) Active Problems:   Ascending aortic aneurysm (HCC)   Pubic ramus fracture, right, closed, initial encounter ([place])   Hypertension    Longstanding persistent atrial fibrillation (HCC)   CAD (coronary artery disease): mild to moderate non-obstructive   Chronic pain   Physical deconditioning   Anemia   Pericardial effusion       Ascending aortic aneurysm (HCC) / Ascending aortic dissection (HCC) / Pericardial effusion Patient has been seen in follow-up with Dr. Roxy Manns. Plan is continue whole ELIQUIS with plan to go to the OR on Wednesday for pericardial window with aortic root replacement. Possible aortic valve repair.  continue blood pressure (& rate) control w/ low-dose labetalol with adequate blood pressure control. No more hypotension  Monitor for signs of tamponade. Currently asymptomatic - no signs of hemodynamically compromised or heart failure  Active Problems:    Pubic ramus fracture, right, closed, initial encounter [facility]) - plan is for expectant/medical management. Seen by orthopedics with plans for physical therapy. Dr. Roxy Manns would like for her to have some rehabilitation done prior to surgery. -->   She did ambulate some in the hallway yesterday. Plan is to continues on today.    Hypertension - was on Bystolic at home - converted to labetalol drip here in the hospital for rate and rhythm control. Would simply go back to [place] on discharge.    Longstanding persistent atrial fibrillation ([place]) - previously managed by her PCP (had yet to follow-up with Dr. Acie Fredrickson from her last hospitalization).  She is on digoxin for rate control and on labetalol drip. Stable rate.  ELIQUIS on hold for planned surgery. Restart when safe postoperatively    CAD (coronary artery disease): mild to moderate non-obstructive: Recent cath with nonobstructive disease is reassuring based on her need for urgent surgery  On atorvastatin, and beta  blocker. - No ischemic Sx.    Physical deconditioning: She has had several events over the last month or so that has left her somewhat deconditioned. She also is comminuted by chronic pain  syndrome.  Plan is to continue gentle rehabilitation prior to her surgery.  Will likely be difficult postop -- will probably need short-term inpatient/facility rehabilitation discharge.    Anemia - hemoglobin stable over 2 days - not checked today.. No longer on anticoagulation. Follow perioperatively.  Plan is OR tomorrow. We will monitor postop and remained available if necessary.   Signed, Glenetta Hew, MD  02/14/2017, 11:07 PM

## 2017-02-14 NOTE — Anesthesia Preprocedure Evaluation (Addendum)
Anesthesia Evaluation  Patient identified by MRN, date of birth, ID band Patient awake    Reviewed: Allergy & Precautions, H&P , NPO status , Patient's Chart, lab work & pertinent test results, reviewed documented beta blocker date and time   Airway Mallampati: II  TM Distance: >3 FB Neck ROM: Full    Dental no notable dental hx. (+) Teeth Intact, Dental Advisory Given   Pulmonary neg pulmonary ROS, former smoker,    Pulmonary exam normal breath sounds clear to auscultation       Cardiovascular Exercise Tolerance: Good hypertension, Pt. on medications and Pt. on home beta blockers + Peripheral Vascular Disease  + dysrhythmias Atrial Fibrillation + Valvular Problems/Murmurs AI and MR  Rhythm:Irregular Rate:Normal     Neuro/Psych negative neurological ROS  negative psych ROS   GI/Hepatic Neg liver ROS, GERD  Medicated and Controlled,  Endo/Other  Hypothyroidism   Renal/GU negative Renal ROS  negative genitourinary   Musculoskeletal  (+) Arthritis , Osteoarthritis,    Abdominal   Peds  Hematology negative hematology ROS (+) anemia ,   Anesthesia Other Findings   Reproductive/Obstetrics negative OB ROS                            Anesthesia Physical Anesthesia Plan  ASA: IV  Anesthesia Plan: General   Post-op Pain Management:    Induction: Intravenous  Airway Management Planned: Oral ETT  Additional Equipment: Arterial line, CVP, PA Cath, TEE, Ultrasound Guidance Line Placement and 3D TEE  Intra-op Plan:   Post-operative Plan: Post-operative intubation/ventilation  Informed Consent: I have reviewed the patients History and Physical, chart, labs and discussed the procedure including the risks, benefits and alternatives for the proposed anesthesia with the patient or authorized representative who has indicated his/her understanding and acceptance.   Dental advisory given  Plan  Discussed with: CRNA  Anesthesia Plan Comments:         Anesthesia Quick Evaluation

## 2017-02-14 NOTE — Progress Notes (Signed)
      [address]       [place] [address]             [phone]        CARDIOTHORACIC SURGERY PROGRESS NOTE  Subjective: No complaints except for positional pain in her neck which is chronic.  Mild discomfort in right hip with standing but reportedly doing fairly well.  No BM since admission but does not feel bloated or constipated.  No chest pain or SOB  Objective: Vital signs: BP Readings from Last 1 Encounters:  02/14/17 118/76   Pulse Readings from Last 1 Encounters:  02/14/17 76   Resp Readings from Last 1 Encounters:  02/14/17 14   Temp Readings from Last 1 Encounters:  02/14/17 98.1 F (36.7 C) (Oral)    Hemodynamics:    Physical Exam:  Rhythm:   Afib  Breath sounds: clear  Heart sounds:  irregular  Incisions:  n/a  Abdomen:  Soft, non distended, non tender  Extremities:  Warm, well-perfused   Intake/Output from previous day: 05/14 0701 - 05/15 0700 In: 260 [P.O.:240; I.V.:20] Out: 1060 [Urine:1060] Intake/Output this shift: No intake/output data recorded.  Lab Results:  CBC: Recent Labs  02/12/17 0606 02/13/17 0227  WBC 10.2 10.0  HGB 8.9* 8.9*  HCT 27.1* 27.3*  PLT 390 440*    BMET:  Recent Labs  02/12/17 0606 02/13/17 0227  NA 131* 132*  K 4.1 4.3  CL 98* 99*  CO2 25 25  GLUCOSE 113* 111*  BUN 14 14  CREATININE 0.81 0.88  CALCIUM 8.7* 8.8*     PT/INR:   Recent Labs  02/13/17 0227  LABPROT 18.2*  INR 1.49    CBG (last 3)  No results for input(s): GLUCAP in the last 72 hours.  ABG    Component Value Date/Time   PHART 7.448 02/13/2017 0912   PCO2ART 37.4 02/13/2017 0912   PO2ART 93.0 02/13/2017 0912   HCO3 25.9 02/13/2017 0912   TCO2 27 02/13/2017 0912   O2SAT 98.0 02/13/2017 0912    CXR: n/a  Assessment/Plan:  I have again reviewed the indications, risks and potential benefits of repair of aortic dissection with possible aortic valve repair or aortic root replacement with the patient and  her daughter.  We also discussed plans for placement of a clip on the left atrial appendage at the time of surgery to decrease her risk of stroke related to atrial fibrillation.  The patient understands and accepts all potential associated risks of surgery including but not limited to risk of death, stroke or anoxic brain injury, myocardial infarction, congestive heart failure, respiratory failure, renal failure, bleeding requiring blood transfusion and/or reexploration, arrhythmia, heart block or bradycardia requiring permanent pacemaker, pneumonia, pleural effusion, wound infection, pulmonary embolus or other thromboembolic complication, chronic pain or other delayed complications.  All questions answered.  For OR tomorrow.  I spent in excess of 15 minutes during the conduct of this hospital encounter and >50% of this time involved direct face-to-face encounter with the patient for counseling and/or coordination of their care.    [name], MD 02/14/2017 10:14 AM

## 2017-02-14 NOTE — Progress Notes (Signed)
RN offered for pt and pt's daughter to watch Cardiac Surgery video and patient declined.  Pt daughter has booklet and says she has read it.

## 2017-02-15 ENCOUNTER — Inpatient Hospital Stay (HOSPITAL_COMMUNITY): Payer: Medicare Other

## 2017-02-15 ENCOUNTER — Inpatient Hospital Stay (HOSPITAL_COMMUNITY): Payer: Medicare Other | Admitting: Certified Registered"

## 2017-02-15 ENCOUNTER — Encounter (HOSPITAL_COMMUNITY): Payer: Self-pay | Admitting: Thoracic Surgery (Cardiothoracic Vascular Surgery)

## 2017-02-15 ENCOUNTER — Encounter (HOSPITAL_COMMUNITY)
Admission: EM | Disposition: A | Discharge status: Rehabilitation Facility | Payer: Self-pay | Source: Home / Self Care | Attending: Thoracic Surgery (Cardiothoracic Vascular Surgery)

## 2017-02-15 DIAGNOSIS — Z951 Presence of aortocoronary bypass graft: Secondary | ICD-10-CM

## 2017-02-15 DIAGNOSIS — I7101 Dissection of thoracic aorta: Secondary | ICD-10-CM

## 2017-02-15 DIAGNOSIS — Z953 Presence of xenogenic heart valve: Secondary | ICD-10-CM

## 2017-02-15 DIAGNOSIS — Z9889 Other specified postprocedural states: Secondary | ICD-10-CM

## 2017-02-15 DIAGNOSIS — Z8679 Personal history of other diseases of the circulatory system: Secondary | ICD-10-CM

## 2017-02-15 HISTORY — PX: TEE WITHOUT CARDIOVERSION: SHX5443

## 2017-02-15 HISTORY — PX: CORONARY ARTERY BYPASS GRAFT: SHX141

## 2017-02-15 HISTORY — PX: PERICARDIAL FLUID DRAINAGE: SHX5100

## 2017-02-15 HISTORY — PX: BENTALL PROCEDURE: SHX5058

## 2017-02-15 HISTORY — PX: CLIPPING OF ATRIAL APPENDAGE: SHX5773

## 2017-02-15 HISTORY — DX: Presence of xenogenic heart valve: Z95.3

## 2017-02-15 HISTORY — DX: Personal history of other diseases of the circulatory system: Z86.79

## 2017-02-15 HISTORY — PX: AORTIC VALVE REPLACEMENT: SHX41

## 2017-02-15 LAB — POCT I-STAT 3, ART BLOOD GAS (G3+)
ACID-BASE EXCESS: 3 mmol/L — AB (ref 0.0–2.0)
Acid-Base Excess: 8 mmol/L — ABNORMAL HIGH (ref 0.0–2.0)
Acid-base deficit: 8 mmol/L — ABNORMAL HIGH (ref 0.0–2.0)
Acid-base deficit: 2 mmol/L (ref 0.0–2.0)
Acid-base deficit: 1 mmol/L (ref 0.0–2.0)
BICARBONATE: 31.5 mmol/L — AB (ref 20.0–28.0)
BICARBONATE: 25.7 mmol/L (ref 20.0–28.0)
Bicarbonate: 28.8 mmol/L — ABNORMAL HIGH (ref 20.0–28.0)
Bicarbonate: 17.9 mmol/L — ABNORMAL LOW (ref 20.0–28.0)
Bicarbonate: 23.5 mmol/L (ref 20.0–28.0)
Bicarbonate: 26.2 mmol/L (ref 20.0–28.0)
O2 SAT: 100 %
O2 SAT: 93 %
O2 SAT: 91 %
O2 Saturation: 100 %
O2 Saturation: 100 %
O2 Saturation: 81 %
PCO2 ART: 35 mmHg (ref 32.0–48.0)
PCO2 ART: 47.7 mmHg (ref 32.0–48.0)
PH ART: 7.317 — AB (ref 7.350–7.450)
PH ART: 7.368 (ref 7.350–7.450)
PO2 ART: 413 mmHg — AB (ref 83.0–108.0)
PO2 ART: 68 mmHg — AB (ref 83.0–108.0)
PO2 ART: 47 mmHg — AB (ref 83.0–108.0)
Patient temperature: 36.5
TCO2: 30 mmol/L (ref 0–100)
TCO2: 33 mmol/L (ref 0–100)
TCO2: 19 mmol/L (ref 0–100)
TCO2: 25 mmol/L (ref 0–100)
TCO2: 27 mmol/L (ref 0–100)
TCO2: 28 mmol/L (ref 0–100)
pCO2 arterial: 47.6 mmHg (ref 32.0–48.0)
pCO2 arterial: 35.6 mmHg (ref 32.0–48.0)
pCO2 arterial: 40.7 mmHg (ref 32.0–48.0)
pCO2 arterial: 48.3 mmHg — ABNORMAL HIGH (ref 32.0–48.0)
pH, Arterial: 7.389 (ref 7.350–7.450)
pH, Arterial: 7.554 — ABNORMAL HIGH (ref 7.350–7.450)
pH, Arterial: 7.337 — ABNORMAL LOW (ref 7.350–7.450)
pH, Arterial: 7.34 — ABNORMAL LOW (ref 7.350–7.450)
pO2, Arterial: 477 mmHg — ABNORMAL HIGH (ref 83.0–108.0)
pO2, Arterial: 646 mmHg — ABNORMAL HIGH (ref 83.0–108.0)
pO2, Arterial: 62 mmHg — ABNORMAL LOW (ref 83.0–108.0)

## 2017-02-15 LAB — POCT I-STAT, CHEM 8
BUN: 12 mg/dL (ref 6–20)
BUN: 12 mg/dL (ref 6–20)
BUN: 9 mg/dL (ref 6–20)
BUN: 9 mg/dL (ref 6–20)
BUN: 8 mg/dL (ref 6–20)
BUN: 8 mg/dL (ref 6–20)
BUN: 9 mg/dL (ref 6–20)
BUN: 10 mg/dL (ref 6–20)
BUN: 9 mg/dL (ref 6–20)
BUN: 9 mg/dL (ref 6–20)
CALCIUM ION: 1.57 mmol/L — AB (ref 1.15–1.40)
CHLORIDE: 90 mmol/L — AB (ref 101–111)
CHLORIDE: 93 mmol/L — AB (ref 101–111)
CHLORIDE: 98 mmol/L — AB (ref 101–111)
CHLORIDE: 95 mmol/L — AB (ref 101–111)
CREATININE: 0.5 mg/dL (ref 0.44–1.00)
CREATININE: 0.6 mg/dL (ref 0.44–1.00)
CREATININE: 0.2 mg/dL — AB (ref 0.44–1.00)
Calcium, Ion: 1.2 mmol/L (ref 1.15–1.40)
Calcium, Ion: 0.95 mmol/L — ABNORMAL LOW (ref 1.15–1.40)
Calcium, Ion: 1.19 mmol/L (ref 1.15–1.40)
Calcium, Ion: 0.77 mmol/L — CL (ref 1.15–1.40)
Calcium, Ion: 0.58 mmol/L — CL (ref 1.15–1.40)
Calcium, Ion: 0.58 mmol/L — CL (ref 1.15–1.40)
Calcium, Ion: 0.67 mmol/L — CL (ref 1.15–1.40)
Calcium, Ion: 1.02 mmol/L — ABNORMAL LOW (ref 1.15–1.40)
Calcium, Ion: 0.85 mmol/L — CL (ref 1.15–1.40)
Chloride: 95 mmol/L — ABNORMAL LOW (ref 101–111)
Chloride: 96 mmol/L — ABNORMAL LOW (ref 101–111)
Chloride: 96 mmol/L — ABNORMAL LOW (ref 101–111)
Chloride: 91 mmol/L — ABNORMAL LOW (ref 101–111)
Chloride: 93 mmol/L — ABNORMAL LOW (ref 101–111)
Chloride: 95 mmol/L — ABNORMAL LOW (ref 101–111)
Creatinine, Ser: 0.3 mg/dL — ABNORMAL LOW (ref 0.44–1.00)
Creatinine, Ser: 0.3 mg/dL — ABNORMAL LOW (ref 0.44–1.00)
Creatinine, Ser: 0.2 mg/dL — ABNORMAL LOW (ref 0.44–1.00)
Creatinine, Ser: <0.2 mg/dL — ABNORMAL LOW (ref 0.44–1.00)
Creatinine, Ser: 0.3 mg/dL — ABNORMAL LOW (ref 0.44–1.00)
Creatinine, Ser: 0.5 mg/dL (ref 0.44–1.00)
Creatinine, Ser: 0.6 mg/dL (ref 0.44–1.00)
GLUCOSE: 94 mg/dL (ref 65–99)
GLUCOSE: 102 mg/dL — AB (ref 65–99)
GLUCOSE: 85 mg/dL (ref 65–99)
GLUCOSE: 90 mg/dL (ref 65–99)
Glucose, Bld: 97 mg/dL (ref 65–99)
Glucose, Bld: 108 mg/dL — ABNORMAL HIGH (ref 65–99)
Glucose, Bld: 109 mg/dL — ABNORMAL HIGH (ref 65–99)
Glucose, Bld: 109 mg/dL — ABNORMAL HIGH (ref 65–99)
Glucose, Bld: 161 mg/dL — ABNORMAL HIGH (ref 65–99)
Glucose, Bld: 174 mg/dL — ABNORMAL HIGH (ref 65–99)
HCT: 25 % — ABNORMAL LOW (ref 36.0–46.0)
HCT: 22 % — ABNORMAL LOW (ref 36.0–46.0)
HEMATOCRIT: 21 % — AB (ref 36.0–46.0)
HEMATOCRIT: 23 % — AB (ref 36.0–46.0)
HEMATOCRIT: 17 % — AB (ref 36.0–46.0)
HEMATOCRIT: 19 % — AB (ref 36.0–46.0)
HEMATOCRIT: 22 % — AB (ref 36.0–46.0)
HEMATOCRIT: 22 % — AB (ref 36.0–46.0)
HEMATOCRIT: 24 % — AB (ref 36.0–46.0)
HEMATOCRIT: 21 % — AB (ref 36.0–46.0)
HEMOGLOBIN: 8.5 g/dL — AB (ref 12.0–15.0)
HEMOGLOBIN: 7.1 g/dL — AB (ref 12.0–15.0)
HEMOGLOBIN: 7.8 g/dL — AB (ref 12.0–15.0)
HEMOGLOBIN: 5.8 g/dL — AB (ref 12.0–15.0)
HEMOGLOBIN: 6.5 g/dL — AB (ref 12.0–15.0)
HEMOGLOBIN: 7.5 g/dL — AB (ref 12.0–15.0)
Hemoglobin: 7.5 g/dL — ABNORMAL LOW (ref 12.0–15.0)
Hemoglobin: 7.5 g/dL — ABNORMAL LOW (ref 12.0–15.0)
Hemoglobin: 8.2 g/dL — ABNORMAL LOW (ref 12.0–15.0)
Hemoglobin: 7.1 g/dL — ABNORMAL LOW (ref 12.0–15.0)
POTASSIUM: 4.5 mmol/L (ref 3.5–5.1)
POTASSIUM: 3.8 mmol/L (ref 3.5–5.1)
POTASSIUM: 4.1 mmol/L (ref 3.5–5.1)
POTASSIUM: 4.8 mmol/L (ref 3.5–5.1)
POTASSIUM: 5 mmol/L (ref 3.5–5.1)
POTASSIUM: 4.4 mmol/L (ref 3.5–5.1)
Potassium: 4.4 mmol/L (ref 3.5–5.1)
Potassium: 4.1 mmol/L (ref 3.5–5.1)
Potassium: 3.8 mmol/L (ref 3.5–5.1)
Potassium: 3.9 mmol/L (ref 3.5–5.1)
SODIUM: 132 mmol/L — AB (ref 135–145)
SODIUM: 125 mmol/L — AB (ref 135–145)
SODIUM: 141 mmol/L (ref 135–145)
SODIUM: 130 mmol/L — AB (ref 135–145)
SODIUM: 137 mmol/L (ref 135–145)
SODIUM: 139 mmol/L (ref 135–145)
Sodium: 134 mmol/L — ABNORMAL LOW (ref 135–145)
Sodium: 136 mmol/L (ref 135–145)
Sodium: 134 mmol/L — ABNORMAL LOW (ref 135–145)
Sodium: 135 mmol/L (ref 135–145)
TCO2: 30 mmol/L (ref 0–100)
TCO2: 29 mmol/L (ref 0–100)
TCO2: 35 mmol/L (ref 0–100)
TCO2: 25 mmol/L (ref 0–100)
TCO2: 42 mmol/L (ref 0–100)
TCO2: 21 mmol/L (ref 0–100)
TCO2: 23 mmol/L (ref 0–100)
TCO2: 28 mmol/L (ref 0–100)
TCO2: 24 mmol/L (ref 0–100)
TCO2: 26 mmol/L (ref 0–100)

## 2017-02-15 LAB — CBC
HCT: 27.8 % — ABNORMAL LOW (ref 36.0–46.0)
HEMATOCRIT: 22.3 % — AB (ref 36.0–46.0)
HEMATOCRIT: 34.2 % — AB (ref 36.0–46.0)
HEMOGLOBIN: 7.5 g/dL — AB (ref 12.0–15.0)
HEMOGLOBIN: 11.1 g/dL — AB (ref 12.0–15.0)
Hemoglobin: 8.8 g/dL — ABNORMAL LOW (ref 12.0–15.0)
MCH: 30.3 pg (ref 26.0–34.0)
MCH: 30.2 pg (ref 26.0–34.0)
MCH: 28.8 pg (ref 26.0–34.0)
MCHC: 31.7 g/dL (ref 30.0–36.0)
MCHC: 33.6 g/dL (ref 30.0–36.0)
MCHC: 32.5 g/dL (ref 30.0–36.0)
MCV: 95.9 fL (ref 78.0–100.0)
MCV: 89.9 fL (ref 78.0–100.0)
MCV: 88.6 fL (ref 78.0–100.0)
Platelets: 414 10*3/uL — ABNORMAL HIGH (ref 150–400)
Platelets: 172 10*3/uL (ref 150–400)
Platelets: 192 10*3/uL (ref 150–400)
RBC: 2.9 MIL/uL — AB (ref 3.87–5.11)
RBC: 2.48 MIL/uL — ABNORMAL LOW (ref 3.87–5.11)
RBC: 3.86 MIL/uL — AB (ref 3.87–5.11)
RDW: 15.2 % (ref 11.5–15.5)
RDW: 14.9 % (ref 11.5–15.5)
RDW: 14.6 % (ref 11.5–15.5)
WBC: 8.6 10*3/uL (ref 4.0–10.5)
WBC: 23.6 10*3/uL — ABNORMAL HIGH (ref 4.0–10.5)
WBC: 18.2 10*3/uL — ABNORMAL HIGH (ref 4.0–10.5)

## 2017-02-15 LAB — HEMOGLOBIN AND HEMATOCRIT, BLOOD
HCT: 21.5 % — ABNORMAL LOW (ref 36.0–46.0)
HEMOGLOBIN: 7 g/dL — AB (ref 12.0–15.0)

## 2017-02-15 LAB — DIC (DISSEMINATED INTRAVASCULAR COAGULATION) PANEL
APTT: 47 s — AB (ref 24–36)
FIBRINOGEN: 328 mg/dL (ref 210–475)
PLATELETS: 178 10*3/uL (ref 150–400)
SMEAR REVIEW: NONE SEEN

## 2017-02-15 LAB — BASIC METABOLIC PANEL
ANION GAP: 8 (ref 5–15)
BUN: 12 mg/dL (ref 6–20)
CO2: 26 mmol/L (ref 22–32)
Calcium: 8.8 mg/dL — ABNORMAL LOW (ref 8.9–10.3)
Chloride: 97 mmol/L — ABNORMAL LOW (ref 101–111)
Creatinine, Ser: 0.76 mg/dL (ref 0.44–1.00)
Glucose, Bld: 92 mg/dL (ref 65–99)
POTASSIUM: 4.4 mmol/L (ref 3.5–5.1)
SODIUM: 131 mmol/L — AB (ref 135–145)

## 2017-02-15 LAB — POCT I-STAT 4, (NA,K, GLUC, HGB,HCT)
GLUCOSE: 183 mg/dL — AB (ref 65–99)
HCT: 32 % — ABNORMAL LOW (ref 36.0–46.0)
Hemoglobin: 10.9 g/dL — ABNORMAL LOW (ref 12.0–15.0)
POTASSIUM: 4 mmol/L (ref 3.5–5.1)
SODIUM: 138 mmol/L (ref 135–145)

## 2017-02-15 LAB — PREPARE RBC (CROSSMATCH)

## 2017-02-15 LAB — DIC (DISSEMINATED INTRAVASCULAR COAGULATION)PANEL
D-Dimer, Quant: 2.22 ug/mL-FEU — ABNORMAL HIGH (ref 0.00–0.50)
INR: 1.64
Prothrombin Time: 19.6 seconds — ABNORMAL HIGH (ref 11.4–15.2)

## 2017-02-15 LAB — PLATELET COUNT: Platelets: 57 10*3/uL — ABNORMAL LOW (ref 150–400)

## 2017-02-15 LAB — PROTIME-INR
INR: 1.55
Prothrombin Time: 18.7 seconds — ABNORMAL HIGH (ref 11.4–15.2)

## 2017-02-15 LAB — APTT: aPTT: 40 seconds — ABNORMAL HIGH (ref 24–36)

## 2017-02-15 LAB — GLUCOSE, CAPILLARY: GLUCOSE-CAPILLARY: 159 mg/dL — AB (ref 65–99)

## 2017-02-15 LAB — FIBRINOGEN: FIBRINOGEN: 157 mg/dL — AB (ref 210–475)

## 2017-02-15 SURGERY — DRAINAGE, PERICARDIAL EFFUSION
Anesthesia: General | Site: Chest

## 2017-02-15 MED ORDER — ACETAMINOPHEN 160 MG/5ML PO SOLN: 650.0000 mg | Freq: Once | ORAL | Status: AC

## 2017-02-15 MED ORDER — ASPIRIN EC 325 MG PO TBEC
325.0000 mg | DELAYED_RELEASE_TABLET | Freq: Every day | ORAL | Status: DC
  Administered 2017-02-17 – 2017-02-19 (×3): 325 mg via ORAL
  Filled 2017-02-15 (×3): qty 1

## 2017-02-15 MED ORDER — ACETAMINOPHEN 650 MG RE SUPP
650.0000 mg | Freq: Once | RECTAL | Status: AC
  Administered 2017-02-15: 650 mg via RECTAL

## 2017-02-15 MED ORDER — DEXTROSE 5 % IV SOLN
1.5000 g | Freq: Two times a day (BID) | INTRAVENOUS | Status: DC
  Administered 2017-02-16 – 2017-02-17 (×3): 1.5 g via INTRAVENOUS
  Filled 2017-02-15 (×4): qty 1.5

## 2017-02-15 MED ORDER — SODIUM CHLORIDE 0.9 % IV SOLN
0.0000 ug/kg/h | INTRAVENOUS | Status: DC
  Administered 2017-02-16: 0.5 ug/kg/h via INTRAVENOUS
  Filled 2017-02-15: qty 2

## 2017-02-15 MED ORDER — SODIUM CHLORIDE 0.9 % IV SOLN: Freq: Once | INTRAVENOUS | Status: DC

## 2017-02-15 MED ORDER — VASOPRESSIN 20 UNIT/ML IV SOLN
0.0400 [IU]/min | INTRAVENOUS | Status: DC
  Administered 2017-02-16 – 2017-02-17 (×2): 0.04 [IU]/min via INTRAVENOUS
  Filled 2017-02-15 (×2): qty 2

## 2017-02-15 MED ORDER — LACTATED RINGERS IV SOLN
INTRAVENOUS | Status: DC | PRN
  Administered 2017-02-15: 08:00:00 via INTRAVENOUS

## 2017-02-15 MED ORDER — MILRINONE LACTATE IN DEXTROSE 20-5 MG/100ML-% IV SOLN
0.1250 ug/kg/min | INTRAVENOUS | Status: DC
  Administered 2017-02-16: 0.5 ug/kg/min via INTRAVENOUS
  Administered 2017-02-16 – 2017-02-17 (×2): 0.375 ug/kg/min via INTRAVENOUS
  Administered 2017-02-17: 0.25 ug/kg/min via INTRAVENOUS
  Administered 2017-02-19 (×2): 0.125 ug/kg/min via INTRAVENOUS
  Filled 2017-02-15 (×6): qty 100

## 2017-02-15 MED ORDER — SODIUM CHLORIDE 0.9 % IV SOLN
0.0000 ug/min | INTRAVENOUS | Status: DC
  Filled 2017-02-15: qty 2

## 2017-02-15 MED ORDER — CHLORHEXIDINE GLUCONATE 0.12 % MT SOLN
15.0000 mL | OROMUCOSAL | Status: AC
  Administered 2017-02-15: 15 mL via OROMUCOSAL

## 2017-02-15 MED ORDER — SODIUM CHLORIDE 0.9 % IV SOLN
INTRAVENOUS | Status: DC
  Filled 2017-02-15: qty 1

## 2017-02-15 MED ORDER — METOPROLOL TARTRATE 5 MG/5ML IV SOLN
2.5000 mg | INTRAVENOUS | Status: DC | PRN
  Administered 2017-02-19: 5 mg via INTRAVENOUS
  Filled 2017-02-15 (×2): qty 5

## 2017-02-15 MED ORDER — INSULIN REGULAR BOLUS VIA INFUSION
0.0000 [IU] | Freq: Three times a day (TID) | INTRAVENOUS | Status: DC
  Filled 2017-02-15: qty 10

## 2017-02-15 MED ORDER — LACTATED RINGERS IV SOLN: INTRAVENOUS | Status: DC

## 2017-02-15 MED ORDER — HEMOSTATIC AGENTS (NO CHARGE) OPTIME
TOPICAL | Status: DC | PRN
  Administered 2017-02-15 (×2): 1 via TOPICAL

## 2017-02-15 MED ORDER — HEMOSTATIC AGENTS (NO CHARGE) OPTIME
TOPICAL | Status: DC | PRN
  Administered 2017-02-15: 1 via TOPICAL

## 2017-02-15 MED ORDER — FENTANYL CITRATE (PF) 250 MCG/5ML IJ SOLN
INTRAMUSCULAR | Status: AC
  Filled 2017-02-15: qty 25

## 2017-02-15 MED ORDER — ROCURONIUM BROMIDE 10 MG/ML (PF) SYRINGE
PREFILLED_SYRINGE | INTRAVENOUS | Status: AC
  Filled 2017-02-15: qty 5

## 2017-02-15 MED ORDER — PROPOFOL 10 MG/ML IV BOLUS
INTRAVENOUS | Status: AC
  Filled 2017-02-15: qty 20

## 2017-02-15 MED ORDER — ALBUMIN HUMAN 5 % IV SOLN
INTRAVENOUS | Status: DC | PRN
  Administered 2017-02-15: 17:00:00 via INTRAVENOUS

## 2017-02-15 MED ORDER — PANTOPRAZOLE SODIUM 40 MG PO TBEC: 40.0000 mg | DELAYED_RELEASE_TABLET | Freq: Every day | ORAL | Status: DC

## 2017-02-15 MED ORDER — DOCUSATE SODIUM 100 MG PO CAPS
200.0000 mg | ORAL_CAPSULE | Freq: Every day | ORAL | Status: DC
  Administered 2017-02-17 – 2017-02-21 (×5): 200 mg via ORAL
  Filled 2017-02-15 (×5): qty 2

## 2017-02-15 MED ORDER — MILRINONE LACTATE IN DEXTROSE 20-5 MG/100ML-% IV SOLN
0.1250 ug/kg/min | INTRAVENOUS | Status: DC
  Filled 2017-02-15: qty 100

## 2017-02-15 MED ORDER — ETOMIDATE 2 MG/ML IV SOLN
INTRAVENOUS | Status: DC | PRN
  Administered 2017-02-15: 20 mg via INTRAVENOUS

## 2017-02-15 MED ORDER — MORPHINE SULFATE (PF) 2 MG/ML IV SOLN: 1.0000 mg | INTRAVENOUS | Status: DC | PRN

## 2017-02-15 MED ORDER — MIDAZOLAM HCL 5 MG/5ML IJ SOLN
INTRAMUSCULAR | Status: DC | PRN
  Administered 2017-02-15: 3 mg via INTRAVENOUS
  Administered 2017-02-15: 1 mg via INTRAVENOUS
  Administered 2017-02-15 (×2): 2 mg via INTRAVENOUS

## 2017-02-15 MED ORDER — BISACODYL 10 MG RE SUPP: 10.0000 mg | Freq: Every day | RECTAL | Status: DC

## 2017-02-15 MED ORDER — DEXTROSE 5 % IV SOLN
INTRAVENOUS | Status: DC | PRN
  Administered 2017-02-15: 50 ug/min via INTRAVENOUS

## 2017-02-15 MED ORDER — SODIUM CHLORIDE 0.9 % IR SOLN
Status: DC | PRN
  Administered 2017-02-15: 5000 mL

## 2017-02-15 MED ORDER — NITROGLYCERIN IN D5W 200-5 MCG/ML-% IV SOLN: 0.0000 ug/min | INTRAVENOUS | Status: DC

## 2017-02-15 MED ORDER — PROPOFOL 10 MG/ML IV BOLUS
INTRAVENOUS | Status: DC | PRN
  Administered 2017-02-15: 50 mg via INTRAVENOUS
  Administered 2017-02-15: 30 mg via INTRAVENOUS

## 2017-02-15 MED ORDER — MAGNESIUM SULFATE 4 GM/100ML IV SOLN
4.0000 g | Freq: Once | INTRAVENOUS | Status: AC
  Administered 2017-02-15: 4 g via INTRAVENOUS
  Filled 2017-02-15: qty 100

## 2017-02-15 MED ORDER — FAMOTIDINE IN NACL 20-0.9 MG/50ML-% IV SOLN
20.0000 mg | Freq: Two times a day (BID) | INTRAVENOUS | Status: AC
  Administered 2017-02-15 – 2017-02-16 (×2): 20 mg via INTRAVENOUS
  Filled 2017-02-15: qty 50

## 2017-02-15 MED ORDER — CALCIUM CHLORIDE 10 % IV SOLN
INTRAVENOUS | Status: DC | PRN
  Administered 2017-02-15: 1 g via INTRAVENOUS

## 2017-02-15 MED ORDER — PROTAMINE SULFATE 10 MG/ML IV SOLN
INTRAVENOUS | Status: DC | PRN
  Administered 2017-02-15: 240 mg via INTRAVENOUS

## 2017-02-15 MED ORDER — BISACODYL 5 MG PO TBEC
10.0000 mg | DELAYED_RELEASE_TABLET | Freq: Every day | ORAL | Status: DC
  Administered 2017-02-17 – 2017-02-19 (×3): 10 mg via ORAL
  Filled 2017-02-15 (×4): qty 2

## 2017-02-15 MED ORDER — SODIUM CHLORIDE 0.9 % IV SOLN
250.0000 mL | INTRAVENOUS | Status: DC
  Administered 2017-02-16: 250 mL via INTRAVENOUS

## 2017-02-15 MED ORDER — ALBUMIN HUMAN 5 % IV SOLN: 250.0000 mL | INTRAVENOUS | Status: AC | PRN

## 2017-02-15 MED ORDER — TRAMADOL HCL 50 MG PO TABS
50.0000 mg | ORAL_TABLET | ORAL | Status: DC | PRN
  Administered 2017-02-16: 50 mg via ORAL
  Administered 2017-02-16: 100 mg via ORAL
  Administered 2017-02-16: 50 mg via ORAL
  Administered 2017-02-17 (×2): 100 mg via ORAL
  Administered 2017-02-18 – 2017-02-20 (×3): 50 mg via ORAL
  Administered 2017-02-20 (×2): 100 mg via ORAL
  Administered 2017-02-21: 50 mg via ORAL
  Administered 2017-02-21: 100 mg via ORAL
  Filled 2017-02-15 (×2): qty 1
  Filled 2017-02-15: qty 2
  Filled 2017-02-15 (×2): qty 1
  Filled 2017-02-15 (×3): qty 2
  Filled 2017-02-15 (×2): qty 1
  Filled 2017-02-15 (×2): qty 2

## 2017-02-15 MED ORDER — ONDANSETRON HCL 4 MG/2ML IJ SOLN: 4.0000 mg | Freq: Four times a day (QID) | INTRAMUSCULAR | Status: DC | PRN

## 2017-02-15 MED ORDER — ARTIFICIAL TEARS OPHTHALMIC OINT
TOPICAL_OINTMENT | OPHTHALMIC | Status: DC | PRN
  Administered 2017-02-15: 1 via OPHTHALMIC

## 2017-02-15 MED ORDER — NOREPINEPHRINE BITARTRATE 1 MG/ML IV SOLN
0.0000 ug/min | INTRAVENOUS | Status: DC
  Filled 2017-02-15: qty 4

## 2017-02-15 MED ORDER — MIDAZOLAM HCL 2 MG/2ML IJ SOLN: 2.0000 mg | INTRAMUSCULAR | Status: DC | PRN

## 2017-02-15 MED ORDER — FENTANYL CITRATE (PF) 250 MCG/5ML IJ SOLN
INTRAMUSCULAR | Status: DC | PRN
  Administered 2017-02-15 (×3): 100 ug via INTRAVENOUS
  Administered 2017-02-15: 50 ug via INTRAVENOUS
  Administered 2017-02-15: 150 ug via INTRAVENOUS
  Administered 2017-02-15: 200 ug via INTRAVENOUS
  Administered 2017-02-15: 150 ug via INTRAVENOUS
  Administered 2017-02-15 (×2): 100 ug via INTRAVENOUS
  Administered 2017-02-15: 50 ug via INTRAVENOUS

## 2017-02-15 MED ORDER — SODIUM CHLORIDE 0.45 % IV SOLN
INTRAVENOUS | Status: DC | PRN
  Administered 2017-02-15: 20:00:00 via INTRAVENOUS

## 2017-02-15 MED ORDER — SODIUM CHLORIDE 0.9 % IV SOLN: INTRAVENOUS | Status: DC

## 2017-02-15 MED ORDER — METHYLPREDNISOLONE SODIUM SUCC 125 MG IJ SOLR
INTRAMUSCULAR | Status: DC | PRN
  Administered 2017-02-15: 125 mg via INTRAVENOUS

## 2017-02-15 MED ORDER — MORPHINE SULFATE (PF) 4 MG/ML IV SOLN: 1.0000 mg | INTRAVENOUS | Status: DC | PRN

## 2017-02-15 MED ORDER — ROCURONIUM BROMIDE 10 MG/ML (PF) SYRINGE
PREFILLED_SYRINGE | INTRAVENOUS | Status: DC | PRN
Start: 2017-02-15 — End: 2017-02-15
  Administered 2017-02-15: 30 mg via INTRAVENOUS
  Administered 2017-02-15: 70 mg via INTRAVENOUS
  Administered 2017-02-15: 30 mg via INTRAVENOUS
  Administered 2017-02-15: 50 mg via INTRAVENOUS

## 2017-02-15 MED ORDER — PHENYLEPHRINE 40 MCG/ML (10ML) SYRINGE FOR IV PUSH (FOR BLOOD PRESSURE SUPPORT)
PREFILLED_SYRINGE | INTRAVENOUS | Status: AC
  Filled 2017-02-15: qty 10

## 2017-02-15 MED ORDER — LACTATED RINGERS IV SOLN: 500.0000 mL | Freq: Once | INTRAVENOUS | Status: DC | PRN

## 2017-02-15 MED ORDER — MILRINONE LACTATE IN DEXTROSE 20-5 MG/100ML-% IV SOLN
INTRAVENOUS | Status: DC | PRN
  Administered 2017-02-15: .75 ug/kg/min via INTRAVENOUS

## 2017-02-15 MED ORDER — MIDAZOLAM HCL 10 MG/2ML IJ SOLN
INTRAMUSCULAR | Status: AC
Start: 2017-02-15 — End: 2017-02-15
  Filled 2017-02-15: qty 2

## 2017-02-15 MED ORDER — LIDOCAINE 2% (20 MG/ML) 5 ML SYRINGE
INTRAMUSCULAR | Status: AC
  Filled 2017-02-15: qty 5

## 2017-02-15 MED ORDER — DEXMEDETOMIDINE HCL IN NACL 200 MCG/50ML IV SOLN
INTRAVENOUS | Status: AC
  Filled 2017-02-15: qty 50

## 2017-02-15 MED ORDER — ASPIRIN 81 MG PO CHEW: 324.0000 mg | CHEWABLE_TABLET | Freq: Every day | ORAL | Status: DC

## 2017-02-15 MED ORDER — NOREPINEPHRINE BITARTRATE 1 MG/ML IV SOLN
0.0000 ug/min | INTRAVENOUS | Status: DC
  Administered 2017-02-15: 2 ug/min via INTRAVENOUS
  Filled 2017-02-15: qty 16

## 2017-02-15 MED ORDER — PHENYLEPHRINE HCL 10 MG/ML IJ SOLN
INTRAMUSCULAR | Status: DC | PRN
  Administered 2017-02-15 (×2): 80 ug via INTRAVENOUS

## 2017-02-15 MED ORDER — VASOPRESSIN 20 UNIT/ML IV SOLN
INTRAVENOUS | Status: AC
  Filled 2017-02-15: qty 1

## 2017-02-15 MED ORDER — VANCOMYCIN HCL IN DEXTROSE 1-5 GM/200ML-% IV SOLN
1000.0000 mg | Freq: Once | INTRAVENOUS | Status: AC
  Administered 2017-02-16: 1000 mg via INTRAVENOUS
  Filled 2017-02-15: qty 200

## 2017-02-15 MED ORDER — OXYCODONE HCL 5 MG PO TABS: 5.0000 mg | ORAL_TABLET | ORAL | Status: DC | PRN

## 2017-02-15 MED ORDER — HEPARIN SODIUM (PORCINE) 1000 UNIT/ML IJ SOLN
INTRAMUSCULAR | Status: DC | PRN
  Administered 2017-02-15: 24000 [IU] via INTRAVENOUS

## 2017-02-15 MED ORDER — MORPHINE SULFATE (PF) 4 MG/ML IV SOLN
1.0000 mg | INTRAVENOUS | Status: DC | PRN
  Administered 2017-02-16: 1 mg via INTRAVENOUS
  Administered 2017-02-16 (×3): 2 mg via INTRAVENOUS
  Filled 2017-02-15 (×4): qty 1

## 2017-02-15 MED ORDER — ACETAMINOPHEN 160 MG/5ML PO SOLN
1000.0000 mg | Freq: Four times a day (QID) | ORAL | Status: DC
  Administered 2017-02-16 (×2): 1000 mg
  Filled 2017-02-15: qty 40.6

## 2017-02-15 MED ORDER — VASOPRESSIN 20 UNIT/ML IV SOLN
0.0300 [IU]/min | INTRAVENOUS | Status: AC
  Administered 2017-02-15: 0.03 [IU]/min via INTRAVENOUS
  Filled 2017-02-15: qty 2

## 2017-02-15 MED ORDER — DOPAMINE-DEXTROSE 3.2-5 MG/ML-% IV SOLN: 0.0000 ug/kg/min | INTRAVENOUS | Status: DC

## 2017-02-15 MED ORDER — SODIUM CHLORIDE 0.9% FLUSH
3.0000 mL | Freq: Two times a day (BID) | INTRAVENOUS | Status: DC
  Administered 2017-02-16 – 2017-02-21 (×7): 3 mL via INTRAVENOUS

## 2017-02-15 MED ORDER — LACTATED RINGERS IV SOLN
INTRAVENOUS | Status: DC | PRN
  Administered 2017-02-15 (×2): via INTRAVENOUS

## 2017-02-15 MED ORDER — SODIUM CHLORIDE 0.9% FLUSH: 3.0000 mL | INTRAVENOUS | Status: DC | PRN

## 2017-02-15 MED ORDER — SODIUM CHLORIDE 0.9 % IJ SOLN
OROMUCOSAL | Status: DC | PRN
  Administered 2017-02-15 (×5): 4 mL via TOPICAL

## 2017-02-15 MED ORDER — ARTIFICIAL TEARS OPHTHALMIC OINT
TOPICAL_OINTMENT | OPHTHALMIC | Status: AC
  Filled 2017-02-15: qty 3.5

## 2017-02-15 MED ORDER — VASOPRESSIN 20 UNIT/ML IV SOLN
INTRAVENOUS | Status: DC | PRN
  Administered 2017-02-15: 4 [IU] via INTRAVENOUS
  Administered 2017-02-15 (×2): 2 [IU] via INTRAVENOUS

## 2017-02-15 MED ORDER — POTASSIUM CHLORIDE 10 MEQ/50ML IV SOLN: 10.0000 meq | INTRAVENOUS | Status: AC

## 2017-02-15 MED ORDER — ACETAMINOPHEN 500 MG PO TABS
1000.0000 mg | ORAL_TABLET | Freq: Four times a day (QID) | ORAL | Status: AC
  Administered 2017-02-16 – 2017-02-20 (×14): 1000 mg via ORAL
  Filled 2017-02-15 (×12): qty 2

## 2017-02-15 MED ORDER — SODIUM CHLORIDE 0.9 % IV SOLN
INTRAVENOUS | Status: DC
  Administered 2017-02-15: 21:00:00 via INTRAVENOUS

## 2017-02-15 SURGICAL SUPPLY — 172 items
ADAPTER CARDIO PERF ANTE/RETRO (ADAPTER) ×5 IMPLANT
APPLICATOR TIP COSEAL (VASCULAR PRODUCTS) ×5 IMPLANT
APPLICATOR TIP STD SYR BGAT-SY (MISCELLANEOUS) IMPLANT
ARTICLIP LAA PROCLIP II 45 (Clip) ×5 IMPLANT
ATTRACTOMAT 16X20 MAGNETIC DRP (DRAPES) ×5 IMPLANT
BAG DECANTER FOR FLEXI CONT (MISCELLANEOUS) ×5 IMPLANT
BANDAGE ACE 4X5 VEL STRL LF (GAUZE/BANDAGES/DRESSINGS) ×5 IMPLANT
BANDAGE ACE 6X5 VEL STRL LF (GAUZE/BANDAGES/DRESSINGS) ×5 IMPLANT
BENZOIN TINCTURE PRP APPL 2/3 (GAUZE/BANDAGES/DRESSINGS) ×5 IMPLANT
BLADE NEEDLE 3 SS STRL (BLADE) ×10 IMPLANT
BLADE STERNUM SYSTEM 6 (BLADE) ×5 IMPLANT
BLADE SURG 11 STRL SS (BLADE) ×10 IMPLANT
BNDG GAUZE ELAST 4 BULKY (GAUZE/BANDAGES/DRESSINGS) ×5 IMPLANT
CANISTER SUCT 3000ML PPV (MISCELLANEOUS) ×10 IMPLANT
CANNULA AORTIC ROOT 9FR (CANNULA) ×5 IMPLANT
CANNULA FEM VENOUS REMOTE 22FR (CANNULA) IMPLANT
CANNULA GRAFT 8MMX50CM (Graft) ×5 IMPLANT
CANNULA GUNDRY RCSP 15FR (MISCELLANEOUS) ×10 IMPLANT
CANNULA MC2 2 STG 36/46 NON-V (CANNULA) ×4 IMPLANT
CANNULA OPTISITE PERFUSION 16F (CANNULA) ×5 IMPLANT
CANNULA OPTISITE PERFUSION 18F (CANNULA) IMPLANT
CANNULA SUMP PERICARDIAL (CANNULA) ×5 IMPLANT
CANNULA VENOUS 2 STG 34/46 (CANNULA) ×1
CANNULA VESSEL 3MM BLUNT TIP (CANNULA) ×5 IMPLANT
CATH CPB KIT OWEN (MISCELLANEOUS) ×5 IMPLANT
CATH HEART VENT LEFT (CATHETERS) ×8 IMPLANT
CATH ROBINSON RED A/P 18FR (CATHETERS) ×20 IMPLANT
CATH THORACIC 36FR (CATHETERS) IMPLANT
CATH THORACIC 36FR RT ANG (CATHETERS) ×5 IMPLANT
CAUTERY EYE LOW TEMP 1300F FIN (OPHTHALMIC RELATED) ×5 IMPLANT
CAUTERY SURG HI TEMP FINE TIP (MISCELLANEOUS) ×5 IMPLANT
CLIP FOGARTY SPRING 6M (CLIP) ×5 IMPLANT
CLIP TI MEDIUM 24 (CLIP) ×5 IMPLANT
CLIP TI WIDE RED SMALL 24 (CLIP) ×15 IMPLANT
CONN ST 1/4X3/8  BEN (MISCELLANEOUS) ×3
CONN ST 1/4X3/8 BEN (MISCELLANEOUS) ×12 IMPLANT
CONT SPEC 4OZ CLIKSEAL STRL BL (MISCELLANEOUS) ×10 IMPLANT
COUNTER NEEDLE 20 DBL MAG RED (NEEDLE) ×5 IMPLANT
COVER MAYO STAND STRL (DRAPES) ×5 IMPLANT
COVER SURGICAL LIGHT HANDLE (MISCELLANEOUS) ×10 IMPLANT
CRADLE DONUT ADULT HEAD (MISCELLANEOUS) ×5 IMPLANT
DERMABOND ADVANCED (GAUZE/BANDAGES/DRESSINGS) ×1
DERMABOND ADVANCED .7 DNX12 (GAUZE/BANDAGES/DRESSINGS) ×4 IMPLANT
DEVICE ATRICLIP LAA PRCLPII 45 (Clip) ×4 IMPLANT
DEVICE TROCAR PUNCTURE CLOSURE (ENDOMECHANICALS) ×5 IMPLANT
DRAIN CHANNEL 19F RND (DRAIN) ×5 IMPLANT
DRAIN CHANNEL 28F RND 3/8 FF (WOUND CARE) IMPLANT
DRAIN CHANNEL 32F RND 10.7 FF (WOUND CARE) ×15 IMPLANT
DRAPE BILATERAL SPLIT (DRAPES) ×5 IMPLANT
DRAPE C-ARM 42X72 X-RAY (DRAPES) ×5 IMPLANT
DRAPE CV SPLIT W-CLR ANES SCRN (DRAPES) ×5 IMPLANT
DRAPE INCISE IOBAN 66X45 STRL (DRAPES) ×10 IMPLANT
DRAPE SLUSH/WARMER DISC (DRAPES) ×5 IMPLANT
DRSG COVADERM 4X14 (GAUZE/BANDAGES/DRESSINGS) ×5 IMPLANT
DRSG COVADERM 4X8 (GAUZE/BANDAGES/DRESSINGS) ×5 IMPLANT
ELECT BLADE 6.5 EXT (BLADE) ×5 IMPLANT
ELECT REM PT RETURN 9FT ADLT (ELECTROSURGICAL) ×10
ELECTRODE REM PT RTRN 9FT ADLT (ELECTROSURGICAL) ×8 IMPLANT
EVACUATOR SILICONE 100CC (DRAIN) ×5 IMPLANT
FELT TEFLON 1X6 (MISCELLANEOUS) ×15 IMPLANT
GAUZE SPONGE 4X4 12PLY STRL (GAUZE/BANDAGES/DRESSINGS) ×5 IMPLANT
GAUZE SPONGE 4X4 12PLY STRL LF (GAUZE/BANDAGES/DRESSINGS) ×5 IMPLANT
GLOVE BIO SURGEON STRL SZ 6 (GLOVE) ×15 IMPLANT
GLOVE BIO SURGEON STRL SZ 6.5 (GLOVE) ×20 IMPLANT
GLOVE BIO SURGEON STRL SZ7 (GLOVE) IMPLANT
GLOVE BIO SURGEON STRL SZ7.5 (GLOVE) ×5 IMPLANT
GLOVE BIOGEL PI IND STRL 6 (GLOVE) ×4 IMPLANT
GLOVE BIOGEL PI IND STRL 6.5 (GLOVE) ×4 IMPLANT
GLOVE BIOGEL PI INDICATOR 6 (GLOVE) ×1
GLOVE BIOGEL PI INDICATOR 6.5 (GLOVE) ×1
GLOVE ORTHO TXT STRL SZ7.5 (GLOVE) ×15 IMPLANT
GOWN STRL REUS W/ TWL LRG LVL3 (GOWN DISPOSABLE) ×16 IMPLANT
GOWN STRL REUS W/TWL LRG LVL3 (GOWN DISPOSABLE) ×4
GRAFT GELWEAVE VALSALVA 24CM (Prosthesis & Implant Heart) ×5 IMPLANT
GRAFT HEMASHIELD 28X40 (Vascular Products) ×5 IMPLANT
GRAFT WOVEN D/V 24DX30L (Vascular Products) ×5 IMPLANT
HANDLE STAPLE ENDO GIA SHORT (STAPLE) ×1
HEMOSTAT POWDER SURGIFOAM 1G (HEMOSTASIS) ×25 IMPLANT
HEMOSTAT SNOW SURGICEL 2X4 (HEMOSTASIS) ×5 IMPLANT
HEMOSTAT SURGICEL 2X4 FIBR (HEMOSTASIS) ×10 IMPLANT
INSERT FOGARTY XLG (MISCELLANEOUS) ×5 IMPLANT
KIT BASIN OR (CUSTOM PROCEDURE TRAY) ×5 IMPLANT
KIT DILATOR VASC 18G NDL (KITS) ×10 IMPLANT
KIT DRAINAGE VACCUM ASSIST (KITS) ×5 IMPLANT
KIT ROOM TURNOVER OR (KITS) ×5 IMPLANT
KIT SUCTION CATH 14FR (SUCTIONS) ×15 IMPLANT
LEAD PACING MYOCARDI (MISCELLANEOUS) ×5 IMPLANT
LINE VENT (MISCELLANEOUS) ×5 IMPLANT
LOOP VESSEL MAXI BLUE (MISCELLANEOUS) ×5 IMPLANT
LOOP VESSEL SUPERMAXI WHITE (MISCELLANEOUS) ×5 IMPLANT
MARKER GRAFT CORONARY BYPASS (MISCELLANEOUS) ×5 IMPLANT
MARKER SKIN DUAL TIP RULER LAB (MISCELLANEOUS) ×5 IMPLANT
NS IRRIG 1000ML POUR BTL (IV SOLUTION) ×25 IMPLANT
PACK OPEN HEART (CUSTOM PROCEDURE TRAY) ×5 IMPLANT
PAD ARMBOARD 7.5X6 YLW CONV (MISCELLANEOUS) ×10 IMPLANT
PAD ELECT DEFIB RADIOL ZOLL (MISCELLANEOUS) ×5 IMPLANT
PENCIL BUTTON HOLSTER BLD 10FT (ELECTRODE) ×10 IMPLANT
POWDER SURGICEL 3.0 GRAM (HEMOSTASIS) ×10 IMPLANT
PUNCH AORTIC ROTATE  4.5MM 8IN (MISCELLANEOUS) ×5 IMPLANT
RELOAD ENDO GIA 30 2.5 (STAPLE) ×10 IMPLANT
SET CARDIOPLEGIA MPS 5001102 (MISCELLANEOUS) ×5 IMPLANT
SET IRRIG TUBING LAPAROSCOPIC (IRRIGATION / IRRIGATOR) ×5 IMPLANT
SOLUTION ANTI FOG 6CC (MISCELLANEOUS) ×5 IMPLANT
SPONGE LAP 18X18 X RAY DECT (DISPOSABLE) ×15 IMPLANT
SPONGE LAP 4X18 X RAY DECT (DISPOSABLE) ×10 IMPLANT
STAPLER ENDO GIA 12MM SHORT (STAPLE) ×4 IMPLANT
STOPCOCK 4 WAY LG BORE MALE ST (IV SETS) ×5 IMPLANT
SURGIFLO W/THROMBIN 8M KIT (HEMOSTASIS) ×5 IMPLANT
SUT BONE WAX W31G (SUTURE) ×5 IMPLANT
SUT ETHIBON 2 0 V 52N 30 (SUTURE) ×15 IMPLANT
SUT ETHIBON EXCEL 2-0 V-5 (SUTURE) IMPLANT
SUT ETHIBOND 2 0 SH (SUTURE)
SUT ETHIBOND 2 0 SH 36X2 (SUTURE) IMPLANT
SUT ETHIBOND 2 0 V4 (SUTURE) IMPLANT
SUT ETHIBOND 2 0V4 GREEN (SUTURE) IMPLANT
SUT ETHIBOND 4 0 RB 1 (SUTURE) IMPLANT
SUT ETHIBOND V-5 VALVE (SUTURE) IMPLANT
SUT ETHIBOND X763 2 0 SH 1 (SUTURE) ×30 IMPLANT
SUT ETHILON 3 0 PS 1 (SUTURE) ×5 IMPLANT
SUT GORETEX CV 4 TH 22 36 (SUTURE) ×5 IMPLANT
SUT GORETEX CV4 TH-18 (SUTURE) ×10 IMPLANT
SUT MNCRL AB 3-0 PS2 18 (SUTURE) ×20 IMPLANT
SUT MNCRL AB 4-0 PS2 18 (SUTURE) ×5 IMPLANT
SUT PDS AB 1 CTX 36 (SUTURE) ×10 IMPLANT
SUT PROLENE 3 0 SH DA (SUTURE) ×30 IMPLANT
SUT PROLENE 3 0 SH1 36 (SUTURE) ×60 IMPLANT
SUT PROLENE 4 0 RB 1 (SUTURE) ×11
SUT PROLENE 4 0 SH DA (SUTURE) ×10 IMPLANT
SUT PROLENE 4-0 RB1 .5 CRCL 36 (SUTURE) ×44 IMPLANT
SUT PROLENE 5 0 C 1 36 (SUTURE) ×35 IMPLANT
SUT PROLENE 5 0 C1 (SUTURE) ×5 IMPLANT
SUT PROLENE 6 0 C 1 30 (SUTURE) ×20 IMPLANT
SUT PROLENE 7.0 RB 3 (SUTURE) ×5 IMPLANT
SUT PROLENE POLY MONO (SUTURE) ×10 IMPLANT
SUT SILK  1 MH (SUTURE) ×6
SUT SILK 1 MH (SUTURE) ×24 IMPLANT
SUT SILK 2 0 SH CR/8 (SUTURE) IMPLANT
SUT SILK 2 0SH CR/8 30 (SUTURE) ×5 IMPLANT
SUT SILK 3 0 (SUTURE) ×1
SUT SILK 3 0 SH CR/8 (SUTURE) IMPLANT
SUT SILK 3 0SH CR/8 30 (SUTURE) ×5 IMPLANT
SUT SILK 3-0 18XBRD TIE 12 (SUTURE) ×4 IMPLANT
SUT SILK 4 0 TIE 10X30 (SUTURE) ×5 IMPLANT
SUT STEEL 6MS V (SUTURE) IMPLANT
SUT VIC AB 2-0 CT1 27 (SUTURE) ×2
SUT VIC AB 2-0 CT1 TAPERPNT 27 (SUTURE) ×8 IMPLANT
SUT VIC AB 2-0 CTX 27 (SUTURE) ×15 IMPLANT
SUT VIC AB 2-0 UR6 27 (SUTURE) ×10 IMPLANT
SUT VIC AB 3-0 SH 8-18 (SUTURE) ×25 IMPLANT
SUT VIC AB 3-0 X1 27 (SUTURE) ×10 IMPLANT
SUT VICRYL 2 TP 1 (SUTURE) ×5 IMPLANT
SUTURE E-PAK OPEN HEART (SUTURE) ×5 IMPLANT
SYR 10ML KIT SKIN ADHESIVE (MISCELLANEOUS) IMPLANT
SYR 10ML LL (SYRINGE) ×5 IMPLANT
SYSTEM SAHARA CHEST DRAIN ATS (WOUND CARE) ×10 IMPLANT
TAPE CLOTH SURG 4X10 WHT LF (GAUZE/BANDAGES/DRESSINGS) ×5 IMPLANT
TAPE PAPER 2X10 WHT MICROPORE (GAUZE/BANDAGES/DRESSINGS) ×5 IMPLANT
TOWEL GREEN STERILE (TOWEL DISPOSABLE) ×5 IMPLANT
TOWEL GREEN STERILE FF (TOWEL DISPOSABLE) ×5 IMPLANT
TOWEL OR 17X24 6PK STRL BLUE (TOWEL DISPOSABLE) ×5 IMPLANT
TOWEL OR 17X26 10 PK STRL BLUE (TOWEL DISPOSABLE) ×10 IMPLANT
TRAY FOLEY SILVER 14FR TEMP (SET/KITS/TRAYS/PACK) ×5 IMPLANT
TROCAR XCEL BLADELESS 5X75MML (TROCAR) ×10 IMPLANT
TUBE CONNECTING 20X1/4 (TUBING) ×5 IMPLANT
TUBE SUCT INTRACARD DLP 20F (MISCELLANEOUS) ×5 IMPLANT
TUBE SUCTION CARDIAC 10FR (CANNULA) ×5 IMPLANT
TUNNELER SHEATH ON-Q 11GX8 DSP (PAIN MANAGEMENT) IMPLANT
UNDERPAD 30X30 (UNDERPADS AND DIAPERS) ×5 IMPLANT
VALVE MAGNA EASE 21MM (Prosthesis & Implant Heart) ×5 IMPLANT
VENT LEFT HEART 12002 (CATHETERS) ×10
WATER STERILE IRR 1000ML POUR (IV SOLUTION) ×10 IMPLANT
YANKAUER SUCT BULB TIP NO VENT (SUCTIONS) ×5 IMPLANT

## 2017-02-15 NOTE — Brief Op Note (Addendum)
02/11/2017 - 02/15/2017  3:13 PM  PATIENT:  Lydia Jordan  83 y.o. female  PRE-OPERATIVE DIAGNOSIS:  TYPE A DISSECTION  POST-OPERATIVE DIAGNOSIS:  TYPE A DISSECTION  PROCEDURE:  Procedure(s): BENTALL AORTIC ROOT REPLACEMENT PROCEDURE (N/A) TRANSESOPHAGEAL ECHOCARDIOGRAM (TEE) (N/A) CLIPPING OF ATRIAL APPENDAGE (Left) DRAINAGE OF PERICARDIAL FLUID CORONARY ARTERY BYPASS GRAFTING (CABG) x  one, using left saphenous vein (N/A) AORTIC VALVE REPLACEMENT (AVR) (N/A)  SURGEON:    [name], MD  ASSISTANTS:  Nicholes Rough, PA-C and Jadene Pierini, PA-C  ANESTHESIA:   Lillia Abed, MD  CIRCULATORY ARREST TIME:  34'  CROSSCLAMP TIME:   208'  CARDIOPULMONARY BYPASS TIME: 316'  FINDINGS:  Type A aortic dissection with subacute inflammation consistent with likely acute event approximately 4-6 weeks previously  Thrombosis of false lumen with very large hematoma within ascending thoracic aortic aneurysm  Extension of false lumen into the right sinus of Valsalva, surrounding the origin of the right coronary artery  Moderate aortic insufficiency  Large serosanguinous pericardial effusion with no signs of pericardial tamponade  Normal LV systolic function  Severe coagulopathy after reversal of heparin  COMPLICATIONS: None  BASELINE WEIGHT: 60.6  PATIENT DISPOSITION:   TO SICU IN STABLE CONDITION  [name], MD 02/15/2017 6:41 PM

## 2017-02-15 NOTE — Anesthesia Postprocedure Evaluation (Signed)
Anesthesia Post Note  Patient: Lydia Jordan  Procedure(s) Performed: Procedure(s) (LRB): BENTALL AORTIC ROOT REPLACEMENT PROCEDURE, REPAIR ASCENDING AORTIC DISSECTION (N/A) TRANSESOPHAGEAL ECHOCARDIOGRAM (TEE) (N/A) CLIPPING OF ATRIAL APPENDAGE (Left) DRAINAGE OF PERICARDIAL FLUID CORONARY ARTERY BYPASS GRAFTING (CABG) x  one, using left saphenous vein (N/A) AORTIC VALVE REPLACEMENT (AVR) (N/A)  Patient location during evaluation: SICU Anesthesia Type: General Level of consciousness: sedated Pain management: pain level controlled Vital Signs Assessment: post-procedure vital signs reviewed and stable Respiratory status: patient remains intubated per anesthesia plan Cardiovascular status: stable Anesthetic complications: no       Last Vitals:  Vitals:   02/15/17 0841 02/15/17 0842  BP:    Pulse: 74 72  Resp: 19 (!) 21  Temp:      Last Pain:  Vitals:   02/15/17 0500  TempSrc:   PainSc: 5                  Lydia Jordan

## 2017-02-15 NOTE — Op Note (Signed)
CARDIOTHORACIC SURGERY OPERATIVE NOTE  Date of Procedure:  02/15/2017  Preoperative Diagnosis:   Type A Aortic Dissection  Moderate Aortic Insufficiency  Large Pericardial Effusion  Long-standing Persistent Atrial Fibrillation   Postoperative Diagnosis: Same   Procedure:    Repair of Ascending Thoracic Aortic Dissection  Right Axillary and Right Common Femoral Artery Cannulation  Deep Hypothermia and total circulatory arrest with continuous retrograde cerebral perfusion  Hemashield Platinum woven double velour straight graft replacement of ascending thoracic aortic aneurysm (size 28 mm)  Hemi-arch distal anastamosis   Biological Bentall Aortic Root Replacement  Edwards Magna Ease Pericardial Tissue Valve (size 21 mm, model # 3300TFX, serial # [id number])  Gelweave Valsalva woven double velour aortic root graft (size 24 mm, ref# [id number], serial #[id number])  Reimplantation of left main coronary artery   Coronary Artery Bypass Grafting x1  Reversed Greater Saphenous Vein Graft to Distal Right Coronary Artery  Open Saphenous Vein Harvest via Left Thigh   Clipping of Left Atrial Appendage  Atricure [id number] left atrial clip (size 30mm)   Drainage of Pericardial Effusion     Surgeon: Valentina Gu. Roxy Manns, MD  Assistant: Nicholes Rough, PA-C and Jadene Pierini, PA-C  Anesthesia: Arabella Merles, MD  Operative Findings:  Type A aortic dissection with subacute inflammation consistent with likely acute event approximately 4-6 weeks previously  Thrombosis of false lumen with very large hematoma within ascending thoracic aortic aneurysm  Extension of false lumen into the right sinus of Valsalva, surrounding the origin of the right coronary artery  Moderate aortic insufficiency  Large serosanguinous pericardial effusion with no signs of pericardial tamponade  Normal LV systolic function  Severe coagulopathy after reversal of  heparin                   BRIEF CLINICAL NOTE AND INDICATIONS FOR SURGERY  Patient is an 83 year old female with history of hypertension, long-standing persistent atrial fibrillation on anticoagulation, severe degenerative arthritis and cervical disc disease with chronic pain who originally presented approximately one month ago with a prolonged episode of substernal chest pain in the setting of hypertension and epistaxis. Eliquis was held and epistaxis resolved.  She ruled in for an acute non-ST segment elevation myocardial infarction by serial cardiac enzymes.  Transthoracic echocardiogram revealed normal left ventricular systolic function with ejection fraction estimated 60-65%. There was moderate focal basal hypertrophy of the septum. The aortic valve was trileaflet with mild aortic insufficiency. There was severe left atrial enlargement with trivial mitral regurgitation. There was an aneurysm of the proximal ascending thoracic aorta measuring 5 cm in diameter. The patient underwent diagnostic cardiac catheterization revealing mild to moderate nonobstructive coronary artery disease with 50% stenosis in the left anterior descending coronary artery and 50% stenosis in the right coronary artery. Eliquis was resumed and the patient discharged home.  3 days ago the patient stumbled and fell while she was walking in the parking lot of her podiatrist office. She immediately developed pain in her right hip which has persisted. She hit her head and suffered multiple bruises. She ultimately presented to the emergency department where she underwent CT scan of the head, neck, and hip revealing nondisplaced fracture of the right pubic ramus. The aorta appeared abnormal on CT scan of the neck, prompting CT scan of the chest with IV contrast which revealed type A aortic dissection. Cardiothoracic surgical consultation was requested.   DETAILS OF THE OPERATIVE PROCEDURE  Preparation:  The patient is  brought to the operating room on the above mentioned  date and central monitoring was established by the anesthesia team including placement of Swan-Ganz catheter and a left radial arterial line. The patient is placed in the supine position on the operating table.  Intravenous antibiotics are administered. General endotracheal anesthesia is induced uneventfully. A Foley catheter is placed.  Continuous transcutaneous cerebral oximetry is monitored throughout the case.  Baseline transesophageal echocardiogram was performed.  Findings were notable for a large pericardial effusion without evidence for pericardial tamponade.  The ascending aorta was dilated and a dissection flap could be seen extending into the aortic root. There was moderate aortic insufficiency. The aortic valve was trileaflet. There was normal left ventricular systolic function. There was mild mitral regurgitation.  The patient's chest, abdomen, both groins, and both lower extremities are prepared and draped in a sterile manner. A time out procedure is performed.   Surgical Approach and Drainage of Pericardial Effusion:  A median sternotomy incision was performed.  The pericardium was opened and there was a large free-flowing serosanguineous pericardial effusion without any clot or gross blood in the pericardial sac. The ascending aorta was dilated to 5.5 cm diameter, surrounded by subacute inflammation with early adhesions between aortic wall in the pericardial sac, and discolored by obvious hematoma within the dissected aortic aneurysm.   Extracorporeal Cardiopulmonary Bypass and Myocardial Protection:  A small oblique incision is made in the right deltopectoral groove. The incision is completed through the subcutaneous tissues tissues and pectoralis fascia with electrocautery. The pectoralis minor muscle was retracted laterally and the floor of the pectoralis fascia in size. The right axillary artery is dissected away from the  associated structures using sharp dissection and care to avoid any electrocautery or surgical manipulation near the brachial plexus. The axillary artery appeared normal in appearance.  The patient is heparinized systemically. An 8 mm Gore-Tex graft preattached to a perfusion tubing circuit was sewn to the axillary artery in end to side fashion.  Hemostasis around the anastomosis was ascertained. This graft was utilized initially for arterial inflow.  Adequate heparinization is verified.   A two-stage venous cannula is placed into the right atrium.  A retrograde cardioplegia cannula is placed through the right atrium into the coronary sinus.  The operative field was continuously flooded with carbon dioxide gas.  The entire pre-bypass portion of the operation was notable for stable hemodynamics.  Cardiopulmonary bypass was begun and the surface of the heart is inspected.  There is subacute inflammation surrounding the entire heart and the dissected aorta. Gentle dissection is utilized to dissect that aorta away from adjacent structures. A left ventricular vent is placed through the right superior pulmomary vein.  A temperature probe was placed in the interventricular septum.    After a period of time on cardiopulmonary bypass, line pressure in the arterial line rose dramatically. The axillary graft was inspected and remained intact with no sign of kinking of the graft or the anastomosis. However, there was increased bleeding from this anastomosis suggesting that there was high distal pressure. Subsequently a small oblique incision was rapidly made in the right groin in the anterior surface the right common femoral artery exposed. The right common femoral artery was cannulated with the Seldinger technique and a 16 French femoral arterial cannula placed. Cardiopulmonary bypass was temporarily discontinued and switched to the femoral cannula. Normal flow with low line pressure was immediately noted.  The  patient is cooled to 18C systemic temperature.  The aortic cross clamp is applied and cardioplegia is delivered initially in a retrograde  fashion through the coronary sinus catheter using modified [place] cold blood cardioplegia (KBC protocol).   The initial cardioplegic arrest is rapid with early diastolic arrest.  Repeat doses of cardioplegia are administered at 90 minutes and every 30 minutes thereafter through the coronary sinus catheter in order to maintain completely flat electrocardiogram.  Myocardial protection was felt to be excellent.   Clipping of Left Atrial Appendage:  The heart is gently retracted towards the surgeon's side and the left atrial appendage exposed. The left atrial appendage is obliterated using an epicardial clip (Atricure Pro245 left atrial appendage clip, size 45 mm) applied under direct vision.   Repair of Ascending Thoracic Aortic Dissection, Bentall Aortic Root Replacement, and Coronary Artery Bypass Grafting:  The ascending aorta was transected several centimeters below the aortic cross-clamp. Upon entry into the anterior surface of the aorta there was a large amount of clot within the false lumen which completely surrounded the true lumen at the level of the midportion of the ascending aorta. All of the clot was evacuated. The false lumen extended proximally below the level of the sinotubular junction and surrounded the ostium the right coronary artery.  Once all the clot had been evacuated the majority of the dissected ascending thoracic aortic aneurysm was excised sharply.  Initially it appeared that it might be possible to resuspend the native aortic valve. The aortic root tissues were therefore initially preserved.  Dissection was continued to mobilize the entire root to facilitate repair, but the dissected portion surrounding the ostium of the right coronary artery was fragile and could not be salvaged. Therefore the right coronary artery was oversewn and a plan  was made to proceed with Bentall root replacement.  Aortic valve leaflets were excised. Left main coronary artery was mobilized on a separate Carrel patch of aortic tissue.  The remainder of the tissues of the sinuses of Valsalva were excised. The aortic annulus was sized to accept a 21 mm stented bioprosthetic tissue valve.  The proximal suture line for Bentall aortic root replacement was performed using interrupted horizontal mattress 2-0 Ethibond pledgeted sutures with pledgets in the supraannular position.  An [facility] Ease pericardial tissue valve (size 21 mm, model # 3300TFX, serial # [id number]) was implanted inside the proximal end of a Gelweave Valsalve woven double velour aortic root graft (size 24 mm, ref# [id number] ADP, serial #[id number]).  The aortic valve and root graft for lowered into place uneventfully and all of the valve sutures secured. The left main coronary artery was reimplanted into the adjacent sinus of Valsalva portion of the aortic root graft after creating a circular defect in the graft using thermal cautery.  Simultaneously during the aortic root replacement procedure a portion of greater saphenous vein was removed from the patient's left thigh using open vein harvest technique by one of the surgical assistants. The greater saphenous vein was notably good quality conduit for grafting. A reversed greater saphenous vein graft was sewn to the distal right coronary artery in end-to-side fashion. At the site of distal bypass the right coronary artery measured greater than 2 mm in diameter and was normal in appearance.  After completion of the distal anastomosis 200 mL of cold blood cardioplegia was administered antegrade through the vein graft into the right coronary artery.  By this juncture the patient's core temperature had reached 18C. High-dose steroids and propofol were administered by the anesthesia team. The patient was placed in Trendelenburg's position.  A retrograde cannula  was placed directly into the superior  vena cava and a snare placed just below it. Cardiopulmonary bypass was discontinued and the patient's blood volume exsanguinated into the pump. Low-flow retrograde cerebral perfusion was immediately commenced.  The aortic cross-clamp was removed. The distal end of the ascending aorta was resected. The aortic arch was carefully inspected. There was a reentry tear on the anterior surface at the level of the innominate artery. There was no clot in the false lumen beyond the innominate artery and the remainder of the arch appeared normal. All arch vessels arose from the true lumen and there were no tears at the origin of the vessels. A decision was made to proceed with hemi-arch distal anastomosis.  The undersurface of the aortic arch was beveled and trimmed to facilitate repair. A 28 mm Hemashield platinum woven double-velour vascular graft with a single 10 mm SideArm was beveled and sewn directly and it end-to-end fashion to the undersurface of the aortic arch.  The distal anastomosis was performed using running 3-0 Prolene suture with Teflon felt strips to buttress the suture line. Several interrupted pledgeted sutures were placed circumferentially to buttress the repair. Retrograde cerebral perfusion was discontinued and normal cardioplegic bypass resumed after de-airing the graft placing a clamp on the proximal end of the 28 mm graft.  The total duration for circulatory arrest was 34 minutes.  Rewarming was begun.  The distal end of the aortic root graft and the proximal end of the ascending aortic graft were trimmed and beveled to an appropriate length and sewn directly to each other in an end-to-end fashion. The single proximal saphenous vein graft anastomosis was then performed directly to the ascending aortic graft after creating a small circular hole in the graft with thermal cautery.   Procedure Completion:  One final dose of warm retrograde "reanimation dose"  cardioplegia was administered retrograde through the coronary sinus catheter while all air was evacuated through the aortic root.  The aortic cross clamp was removed after a total cross clamp time of 208 minutes.  The aortic graft was carefully inspected for hemostasis. No significant repairs were necessary.  Epicardial pacing wires are fixed to the right ventricular outflow tract and to the right atrial appendage. The patient is rewarmed to 37C temperature, which took an extended period of time. During rewarming the arterial inflow cannula was moved to the side arm of the aortic graft. The femoral artery cannula was removed. The right axillary graft was transected using a vascular stapler close to the anastomosis with the axillary artery. The left ventricular vent was removed.  The patient is weaned and disconnected from cardiopulmonary bypass.  The patient's rhythm at separation from bypass was V-paced.  Initially the patient was weaned from cardiopulmonary bypass without any inotropic support. Although left ventricular function appeared normal the patient had significant right ventricular dysfunction. The patient was rested on cardiopulmonary bypass for several additional minutes and both dopamine and milrinone infusions commenced. Ultimately the patient was weaned and separated from pulmonary bypass without difficulty. Total cardiopulmonary bypass time for the operation was 316 minutes.  Followup transesophageal echocardiogram performed after separation from bypass revealed a well-seated aortic valve prosthesis that was functioning normally and without any sign of aortic insufficiency.  Left ventricular function was normal. Right ventricular function progressively improved as time went on.  Protamine was administered to reverse the anticoagulation. The venous cannula was removed. The SideArm aortic inflow to the aortic graft was transected using a vascular stapler. The mediastinum and pleural space were  inspected for hemostasis and  irrigated with saline solution. Initially there was severe coagulopathy.   The patient received a total of 2 packs adult platelets, 4 units fresh frozen plasma, and 2 packs of cryoprecipitate due to coagulopathy and thrombocytopenia after separation from cardiopulmonary bypass and reversal of heparin with protamine.  The patient received a total of 5 units packed red blood cells during the procedure due to anemia which was present preoperatively and exacerbated by acute blood loss and hemodilution during cardiopulmonary bypass.  The mediastinum and both pleural spaces were drained using 4 chest tubes placed through separate stab incisions inferiorly.  The soft tissues anterior to the aorta were reapproximated loosely. The sternum is closed with double strength sternal wire. The soft tissues anterior to the sternum were closed in multiple layers and the skin is closed with a running subcuticular skin closure.  The post-bypass portion of the operation was notable for stable rhythm and hemodynamics.    Disposition:  The patient tolerated the procedure well and is transported to the surgical intensive care in stable condition. There are no intraoperative complications. All sponge instrument and needle counts are verified correct at completion of the operation.    Valentina Gu. Roxy Manns MD 02/15/2017 6:46 PM

## 2017-02-15 NOTE — Anesthesia Procedure Notes (Signed)
Central Venous Catheter Insertion Performed by: Roderic Palau, anesthesiologist Start/End5/16/2018 7:40 AM, 02/15/2017 7:50 AM Patient location: Pre-op. Preanesthetic checklist: patient identified, IV checked, site marked, risks and benefits discussed, surgical consent, monitors and equipment checked, pre-op evaluation, timeout performed and anesthesia consent Position: Trendelenburg Lidocaine 1% used for infiltration and patient sedated Hand hygiene performed , maximum sterile barriers used  and Seldinger technique used Catheter size: 9 Fr Total catheter length 10. Central line and PA cath was placed.Sheath introducer Swan type:thermodilution PA Cath depth:50 Procedure performed using ultrasound guided technique. Ultrasound Notes:anatomy identified, needle tip was noted to be adjacent to the nerve/plexus identified, no ultrasound evidence of intravascular and/or intraneural injection and image(s) printed for medical record Attempts: 1 Following insertion, line sutured and dressing applied. Post procedure assessment: blood return through all ports, free fluid flow and no air  Patient tolerated the procedure well with no immediate complications.

## 2017-02-15 NOTE — Transfer of Care (Signed)
Immediate Anesthesia Transfer of Care Note  Patient: Lydia Jordan  Procedure(s) Performed: Procedure(s): BENTALL AORTIC ROOT REPLACEMENT PROCEDURE, REPAIR ASCENDING AORTIC DISSECTION (N/A) TRANSESOPHAGEAL ECHOCARDIOGRAM (TEE) (N/A) CLIPPING OF ATRIAL APPENDAGE (Left) DRAINAGE OF PERICARDIAL FLUID CORONARY ARTERY BYPASS GRAFTING (CABG) x  one, using left saphenous vein (N/A) AORTIC VALVE REPLACEMENT (AVR) (N/A)  Patient Location: SICU  Anesthesia Type:General  Level of Consciousness: Patient remains intubated per anesthesia plan  Airway & Oxygen Therapy: Patient remains intubated per anesthesia plan and Patient placed on Ventilator (see vital sign flow sheet for setting)  Post-op Assessment: Report given to RN and Post -op Vital signs reviewed and stable  Post vital signs: Reviewed and stable  Last Vitals:  Vitals:   02/15/17 0841 02/15/17 0842  BP:    Pulse: 74 72  Resp: 19 (!) 21  Temp:      Last Pain:  Vitals:   02/15/17 0500  TempSrc:   PainSc: 5       Patients Stated Pain Goal: 0 (43/60/16 5800)  Complications: No apparent anesthesia complications

## 2017-02-15 NOTE — Anesthesia Procedure Notes (Signed)
Procedure Name: Intubation Date/Time: 02/15/2017 9:15 AM Performed by: Laretta Alstrom Pre-anesthesia Checklist: Patient identified, Emergency Drugs available, Suction available and Patient being monitored Patient Re-evaluated:Patient Re-evaluated prior to inductionOxygen Delivery Method: Circle System Utilized Preoxygenation: Pre-oxygenation with 100% oxygen Intubation Type: IV induction Ventilation: Oral airway inserted - appropriate to patient size Laryngoscope Size: Mac and 4 Grade View: Grade I Tube type: Oral Tube size: 7.0 mm Number of attempts: 1 Airway Equipment and Method: Stylet and Oral airway Placement Confirmation: ETT inserted through vocal cords under direct vision,  positive ETCO2 and breath sounds checked- equal and bilateral Secured at: 22 cm Tube secured with: Tape Dental Injury: Teeth and Oropharynx as per pre-operative assessment  Comments: Lydia Jordan, [place]

## 2017-02-15 NOTE — Progress Notes (Signed)
[facility] BRIEF SICU PROGRESS NOTE  Day of Surgery  S/P Procedure(s) (LRB): BENTALL AORTIC ROOT REPLACEMENT PROCEDURE, REPAIR ASCENDING AORTIC DISSECTION (N/A) TRANSESOPHAGEAL ECHOCARDIOGRAM (TEE) (N/A) CLIPPING OF ATRIAL APPENDAGE (Left) DRAINAGE OF PERICARDIAL FLUID CORONARY ARTERY BYPASS GRAFTING (CABG) x  one, using left saphenous vein (N/A) AORTIC VALVE REPLACEMENT (AVR) (N/A)   Sedated on vent Afib w/ controlled rate, stable hemodynamics Chest tube output low UOP > 100 mL/hr Labs okay although coags still prolonged  Plan: Continue routine early postop.  Wean levophed as tolerated  [name], MD 02/15/2017 9:59 PM

## 2017-02-16 ENCOUNTER — Encounter (HOSPITAL_COMMUNITY): Payer: Self-pay | Admitting: Thoracic Surgery (Cardiothoracic Vascular Surgery)

## 2017-02-16 ENCOUNTER — Inpatient Hospital Stay (HOSPITAL_COMMUNITY): Payer: Medicare Other

## 2017-02-16 LAB — BPAM FFP
BLOOD PRODUCT EXPIRATION DATE: 201805202359
Blood Product Expiration Date: 201805172359
Blood Product Expiration Date: 201805172359
Blood Product Expiration Date: 201805172359
ISSUE DATE / TIME: 201805161445
ISSUE DATE / TIME: 201805161445
ISSUE DATE / TIME: 201805161445
ISSUE DATE / TIME: 201805161445
UNIT TYPE AND RH: 6200
UNIT TYPE AND RH: 6200
UNIT TYPE AND RH: 6200
Unit Type and Rh: 6200

## 2017-02-16 LAB — PREPARE CRYOPRECIPITATE
UNIT DIVISION: 0
Unit division: 0

## 2017-02-16 LAB — POCT I-STAT, CHEM 8
BUN: 15 mg/dL (ref 6–20)
CALCIUM ION: 1.13 mmol/L — AB (ref 1.15–1.40)
CHLORIDE: 102 mmol/L (ref 101–111)
Creatinine, Ser: 0.7 mg/dL (ref 0.44–1.00)
Glucose, Bld: 144 mg/dL — ABNORMAL HIGH (ref 65–99)
HEMATOCRIT: 29 % — AB (ref 36.0–46.0)
Hemoglobin: 9.9 g/dL — ABNORMAL LOW (ref 12.0–15.0)
POTASSIUM: 4.5 mmol/L (ref 3.5–5.1)
SODIUM: 137 mmol/L (ref 135–145)
TCO2: 26 mmol/L (ref 0–100)

## 2017-02-16 LAB — CBC
HCT: 32.7 % — ABNORMAL LOW (ref 36.0–46.0)
HEMATOCRIT: 30.7 % — AB (ref 36.0–46.0)
HEMOGLOBIN: 10.9 g/dL — AB (ref 12.0–15.0)
HEMOGLOBIN: 10.4 g/dL — AB (ref 12.0–15.0)
MCH: 29.3 pg (ref 26.0–34.0)
MCH: 29.8 pg (ref 26.0–34.0)
MCHC: 33.3 g/dL (ref 30.0–36.0)
MCHC: 33.9 g/dL (ref 30.0–36.0)
MCV: 87.9 fL (ref 78.0–100.0)
MCV: 88 fL (ref 78.0–100.0)
Platelets: 168 10*3/uL (ref 150–400)
Platelets: 160 10*3/uL (ref 150–400)
RBC: 3.72 MIL/uL — AB (ref 3.87–5.11)
RBC: 3.49 MIL/uL — ABNORMAL LOW (ref 3.87–5.11)
RDW: 15.3 % (ref 11.5–15.5)
RDW: 16 % — ABNORMAL HIGH (ref 11.5–15.5)
WBC: 12.1 10*3/uL — AB (ref 4.0–10.5)
WBC: 18.6 10*3/uL — AB (ref 4.0–10.5)

## 2017-02-16 LAB — MAGNESIUM
MAGNESIUM: 3.2 mg/dL — AB (ref 1.7–2.4)
MAGNESIUM: 2.7 mg/dL — AB (ref 1.7–2.4)

## 2017-02-16 LAB — PREPARE FRESH FROZEN PLASMA
UNIT DIVISION: 0
UNIT DIVISION: 0
Unit division: 0
Unit division: 0

## 2017-02-16 LAB — POCT I-STAT 3, ART BLOOD GAS (G3+)
Acid-Base Excess: 2 mmol/L (ref 0.0–2.0)
Acid-base deficit: 1 mmol/L (ref 0.0–2.0)
Acid-base deficit: 2 mmol/L (ref 0.0–2.0)
BICARBONATE: 28 mmol/L (ref 20.0–28.0)
BICARBONATE: 25 mmol/L (ref 20.0–28.0)
Bicarbonate: 24.1 mmol/L (ref 20.0–28.0)
O2 SAT: 92 %
O2 SAT: 95 %
O2 Saturation: 99 %
PCO2 ART: 46.7 mmHg (ref 32.0–48.0)
PCO2 ART: 44.9 mmHg (ref 32.0–48.0)
PCO2 ART: 44.3 mmHg (ref 32.0–48.0)
PO2 ART: 132 mmHg — AB (ref 83.0–108.0)
PO2 ART: 82 mmHg — AB (ref 83.0–108.0)
Patient temperature: 36.3
Patient temperature: 36.7
Patient temperature: 36.8
TCO2: 29 mmol/L (ref 0–100)
TCO2: 26 mmol/L (ref 0–100)
TCO2: 25 mmol/L (ref 0–100)
pH, Arterial: 7.383 (ref 7.350–7.450)
pH, Arterial: 7.352 (ref 7.350–7.450)
pH, Arterial: 7.343 — ABNORMAL LOW (ref 7.350–7.450)
pO2, Arterial: 68 mmHg — ABNORMAL LOW (ref 83.0–108.0)

## 2017-02-16 LAB — PREPARE PLATELET PHERESIS
UNIT DIVISION: 0
Unit division: 0

## 2017-02-16 LAB — BASIC METABOLIC PANEL
ANION GAP: 9 (ref 5–15)
BUN: 9 mg/dL (ref 6–20)
CHLORIDE: 102 mmol/L (ref 101–111)
CO2: 26 mmol/L (ref 22–32)
Calcium: 7.8 mg/dL — ABNORMAL LOW (ref 8.9–10.3)
Creatinine, Ser: 0.7 mg/dL (ref 0.44–1.00)
GFR calc Af Amer: >60 mL/min (ref >60)
GFR calc non Af Amer: >60 mL/min (ref >60)
Glucose, Bld: 91 mg/dL (ref 65–99)
POTASSIUM: 3.1 mmol/L — AB (ref 3.5–5.1)
Sodium: 137 mmol/L (ref 135–145)

## 2017-02-16 LAB — BPAM CRYOPRECIPITATE
BLOOD PRODUCT EXPIRATION DATE: 201805162155
Blood Product Expiration Date: 201805162155
ISSUE DATE / TIME: 201805161618
ISSUE DATE / TIME: 201805161621
UNIT TYPE AND RH: 5100
Unit Type and Rh: 5100

## 2017-02-16 LAB — BPAM PLATELET PHERESIS
BLOOD PRODUCT EXPIRATION DATE: 201805172359
BLOOD PRODUCT EXPIRATION DATE: 201805172359
ISSUE DATE / TIME: 201805161448
ISSUE DATE / TIME: 201805161448
UNIT TYPE AND RH: 5100
Unit Type and Rh: 9500

## 2017-02-16 LAB — GLUCOSE, CAPILLARY
GLUCOSE-CAPILLARY: 126 mg/dL — AB (ref 65–99)
GLUCOSE-CAPILLARY: 144 mg/dL — AB (ref 65–99)
GLUCOSE-CAPILLARY: 80 mg/dL (ref 65–99)
Glucose-Capillary: 100 mg/dL — ABNORMAL HIGH (ref 65–99)
Glucose-Capillary: 117 mg/dL — ABNORMAL HIGH (ref 65–99)
Glucose-Capillary: 126 mg/dL — ABNORMAL HIGH (ref 65–99)
Glucose-Capillary: 140 mg/dL — ABNORMAL HIGH (ref 65–99)
Glucose-Capillary: 147 mg/dL — ABNORMAL HIGH (ref 65–99)
Glucose-Capillary: 162 mg/dL — ABNORMAL HIGH (ref 65–99)
Glucose-Capillary: 82 mg/dL (ref 65–99)

## 2017-02-16 LAB — CREATININE, SERUM: CREATININE: 0.83 mg/dL (ref 0.44–1.00)

## 2017-02-16 LAB — COOXEMETRY PANEL
Carboxyhemoglobin: 0.9 % (ref 0.5–1.5)
METHEMOGLOBIN: 1.2 % (ref 0.0–1.5)
O2 SAT: 48.7 %
Total hemoglobin: 10.5 g/dL — ABNORMAL LOW (ref 12.0–16.0)

## 2017-02-16 MED ORDER — FUROSEMIDE 10 MG/ML IJ SOLN
20.0000 mg | Freq: Once | INTRAMUSCULAR | Status: AC
Start: 2017-02-16 — End: 2017-02-16

## 2017-02-16 MED ORDER — DEXTROSE 5 % IV SOLN
0.0000 ug/min | INTRAVENOUS | Status: DC
  Filled 2017-02-16: qty 16

## 2017-02-16 MED ORDER — POTASSIUM CHLORIDE 10 MEQ/50ML IV SOLN
10.0000 meq | INTRAVENOUS | Status: AC | PRN
  Administered 2017-02-16 (×3): 10 meq via INTRAVENOUS

## 2017-02-16 MED ORDER — PANTOPRAZOLE SODIUM 40 MG PO TBEC
80.0000 mg | DELAYED_RELEASE_TABLET | Freq: Every day | ORAL | Status: DC
  Administered 2017-02-16 – 2017-02-21 (×6): 80 mg via ORAL
  Filled 2017-02-16 (×6): qty 2

## 2017-02-16 MED ORDER — FUROSEMIDE 10 MG/ML IJ SOLN: 20.0000 mg | Freq: Two times a day (BID) | INTRAMUSCULAR | Status: DC

## 2017-02-16 MED ORDER — LEVOTHYROXINE SODIUM 100 MCG PO TABS
100.0000 ug | ORAL_TABLET | Freq: Every day | ORAL | Status: DC
  Administered 2017-02-17 – 2017-02-21 (×5): 100 ug via ORAL
  Filled 2017-02-16 (×5): qty 1

## 2017-02-16 MED ORDER — ORAL CARE MOUTH RINSE
15.0000 mL | Freq: Two times a day (BID) | OROMUCOSAL | Status: DC
  Administered 2017-02-16 – 2017-02-21 (×10): 15 mL via OROMUCOSAL

## 2017-02-16 MED ORDER — CHLORHEXIDINE GLUCONATE CLOTH 2 % EX PADS
6.0000 | MEDICATED_PAD | Freq: Every day | CUTANEOUS | Status: DC
  Administered 2017-02-16 – 2017-02-21 (×5): 6 via TOPICAL

## 2017-02-16 MED ORDER — INSULIN ASPART 100 UNIT/ML ~~LOC~~ SOLN
0.0000 [IU] | SUBCUTANEOUS | Status: DC
  Administered 2017-02-16 – 2017-02-17 (×6): 2 [IU] via SUBCUTANEOUS

## 2017-02-16 MED ORDER — POTASSIUM CHLORIDE 10 MEQ/50ML IV SOLN
10.0000 meq | INTRAVENOUS | Status: AC
  Administered 2017-02-16 (×3): 10 meq via INTRAVENOUS
  Filled 2017-02-16: qty 50

## 2017-02-16 MED FILL — Electrolyte-R (PH 7.4) Solution: INTRAVENOUS | Qty: 6000 | Status: AC

## 2017-02-16 MED FILL — Sodium Bicarbonate IV Soln 8.4%: INTRAVENOUS | Qty: 200 | Status: AC

## 2017-02-16 MED FILL — Sodium Chloride IV Soln 0.9%: INTRAVENOUS | Qty: 2000 | Status: AC

## 2017-02-16 MED FILL — Calcium Chloride Inj 10%: INTRAVENOUS | Qty: 10 | Status: AC

## 2017-02-16 MED FILL — Lidocaine HCl IV Inj 20 MG/ML: INTRAVENOUS | Qty: 25 | Status: AC

## 2017-02-16 NOTE — Progress Notes (Signed)
Patient ID: Eleonore Chiquito, female   DOB: [birth year], 83 y.o.   MRN: [id number] SICU Evening Rounds  Hemodynamically stable on milrinone 0.375, dop 3, vasopressin .04 CI 1.5 Co-ox 48.7  Extubated today. sats 96%.  Diuresing well  Chest tube output low  BMET    Component Value Date/Time   NA 137 02/16/2017 1658   K 4.5 02/16/2017 1658   CL 102 02/16/2017 1658   CO2 26 02/16/2017 0309   GLUCOSE 144 (H) 02/16/2017 1658   BUN 15 02/16/2017 1658   CREATININE 0.70 02/16/2017 1658   CALCIUM 7.8 (L) 02/16/2017 0309   GFRNONAA >60 02/16/2017 1647   GFRAA >60 02/16/2017 1647   CBC    Component Value Date/Time   WBC 18.6 (H) 02/16/2017 1647   RBC 3.49 (L) 02/16/2017 1647   HGB 9.9 (L) 02/16/2017 1658   HCT 29.0 (L) 02/16/2017 1658   PLT 160 02/16/2017 1647   MCV 88.0 02/16/2017 1647   MCH 29.8 02/16/2017 1647   MCHC 33.9 02/16/2017 1647   RDW 16.0 (H) 02/16/2017 1647   LYMPHSABS 2.0 01/10/2017 1825   MONOABS 1.8 (H) 01/10/2017 1825   EOSABS 0.2 01/10/2017 1825   BASOSABS 0.0 01/10/2017 1825     Continue present course.

## 2017-02-16 NOTE — Progress Notes (Signed)
[address]       [place] [address]             [phone]        CARDIOTHORACIC SURGERY PROGRESS NOTE   R1 Day Post-Op Procedure(s) (LRB): BENTALL AORTIC ROOT REPLACEMENT PROCEDURE, REPAIR ASCENDING AORTIC DISSECTION (N/A) TRANSESOPHAGEAL ECHOCARDIOGRAM (TEE) (N/A) CLIPPING OF ATRIAL APPENDAGE (Left) DRAINAGE OF PERICARDIAL FLUID CORONARY ARTERY BYPASS GRAFTING (CABG) x  one, using left saphenous vein (N/A) AORTIC VALVE REPLACEMENT (AVR) (N/A)  Subjective: Wakes up on vent.  Follows some simple commands.  Moves all 4 extremities  Objective: Vital signs: BP Readings from Last 1 Encounters:  02/16/17 93/68   Pulse Readings from Last 1 Encounters:  02/16/17 96   Resp Readings from Last 1 Encounters:  02/16/17 18   Temp Readings from Last 1 Encounters:  02/16/17 97.5 F (36.4 C)    Hemodynamics: PAP: (33-70)/(20-45) 62/38 CO:  [2.7 L/min-4 L/min] 3.6 L/min CI:  [1.6 L/min/m2-2.5 L/min/m2] 2.2 L/min/m2  Physical Exam:  Rhythm:   Afib  Breath sounds: clear  Heart sounds:  irregular  Incisions:  Dressings dry, intact  Abdomen:  Soft, non-distended  Extremities:  Warm, well-perfused, swollen  Chest tubes:  low volume thin serosanguinous output, no air leak    Intake/Output from previous day: 05/16 0701 - 05/17 0700 In: 8090.6 [I.V.:3924.4; Blood:3466.2; IV Piggyback:700] Out: 8538 [Urine:2505; Drains:183; Blood:4700; Chest Tube:1150] Intake/Output this shift: Total I/O In: 72.4 [I.V.:72.4] Out: -   Lab Results:  CBC: Recent Labs  02/15/17 1930 02/15/17 1939 02/16/17 0309  WBC 18.2*  --  12.1*  HGB 11.1* 10.9* 10.9*  HCT 34.2* 32.0* 32.7*  PLT 192  --  168    BMET:  Recent Labs  02/15/17 0533  02/15/17 1741 02/15/17 1939 02/16/17 0309  NA 131*  < > 139 138 137  K 4.4  < > 3.9 4.0 3.1*  CL 97*  < > 95*  --  102  CO2 26  --   --   --  26  GLUCOSE 92  < > 174* 183* 91  BUN 12  < > 9  --  9  CREATININE 0.76  < >  0.60  --  0.70  CALCIUM 8.8*  --   --   --  7.8*  < > = values in this interval not displayed.   PT/INR:   Recent Labs  02/15/17 1930  LABPROT 18.7*  INR 1.55    CBG (last 3)   Recent Labs  02/16/17 0152 02/16/17 0258 02/16/17 0322  GLUCAP 100* 80 82    ABG    Component Value Date/Time   PHART 7.383 02/16/2017 0305   PCO2ART 46.7 02/16/2017 0305   PO2ART 132.0 (H) 02/16/2017 0305   HCO3 28.0 02/16/2017 0305   TCO2 29 02/16/2017 0305   ACIDBASEDEF 1.0 02/15/2017 1814   O2SAT 99.0 02/16/2017 0305    CXR: PORTABLE CHEST 1 VIEW  COMPARISON:  02/15/2017 .  FINDINGS: Endotracheal tube, Swan-Ganz catheter, mediastinal drainage tube, bilateral chest tubes in stable position. NG tube is again noted within the esophagus. Prior CABG and cardiac valve replacement. Left atrial appendage clip. Stable mediastinal and cardiac prominence noted. Stable bilateral subsegmental atelectasis. No prominent pleural effusion or pneumothorax.  IMPRESSION: 1. Lines and tubes including bilateral chest tubes in stable position. NG tube is again noted within the esophagus. No pneumothorax.  2. Prior CABG and cardiac valve replacement. Left atrial appendage clip noted. Stable prominence  of the cardiomediastinal silhouette.  3.  Stable subsegmental atelectatic changes both lungs.   Electronically Signed   By: Marcello Moores  Register   On: 02/16/2017 07:41  EKG: Atrial fibrillation w/out acute ischemic changes   Assessment/Plan: S/P Procedure(s) (LRB): BENTALL AORTIC ROOT REPLACEMENT PROCEDURE, REPAIR ASCENDING AORTIC DISSECTION (N/A) TRANSESOPHAGEAL ECHOCARDIOGRAM (TEE) (N/A) CLIPPING OF ATRIAL APPENDAGE (Left) DRAINAGE OF PERICARDIAL FLUID CORONARY ARTERY BYPASS GRAFTING (CABG) x  one, using left saphenous vein (N/A) AORTIC VALVE REPLACEMENT (AVR) (N/A)  Overall stable POD1 Maintaining rate-controlled Afib w/ stable hemodynamics on milrinone, vasopressin, and low dose  dopamine + levophed O2 sats 100% and CXR looks clear Appears to be neurologically intact   Wean vent, possibly extubate  Wean drips slowly  Mobilize after extubation   [name], MD 02/16/2017 8:16 AM

## 2017-02-16 NOTE — Plan of Care (Signed)
Problem: Cardiac: Goal: Hemodynamic stability will improve Outcome: Progressing Hemodynamics maintained with milrinone, dopamine, and levophed gtts Goal: Ability to maintain an adequate cardiac output will improve Outcome: Progressing CI 1.8 to 2 Goal: Will show no signs and symptoms of excessive bleeding Outcome: Progressing Chest tubes with moderate sangous output   Problem: Physical Regulation: Goal: Diagnostic test results will improve Outcome: Progressing Am labs wnl  Problem: Respiratory: Goal: Levels of oxygenation will improve Outcome: Progressing PO2 > 100 on am ABG Goal: Ability to tolerate decreased levels of ventilator support will improve Outcome: Progressing Weaning FIO2 on vent

## 2017-02-16 NOTE — Procedures (Signed)
Extubation Procedure Note  Patient Details:   Name: Lydia Jordan DOB: 1933-11-01 MRN: [id number]   Airway Documentation:     Evaluation  O2 sats: stable throughout Complications: No apparent complications Patient did tolerate procedure well. Bilateral Breath Sounds: Diminished, Clear   Yes  Placed on 6l/min [place] sp02 92-94% Unable to perform IS instruction  Revonda Standard 02/16/2017, 8:41 AM

## 2017-02-17 ENCOUNTER — Inpatient Hospital Stay (HOSPITAL_COMMUNITY): Payer: Medicare Other

## 2017-02-17 LAB — TYPE AND SCREEN
ABO/RH(D): O POS
Antibody Screen: NEGATIVE
UNIT DIVISION: 0
UNIT DIVISION: 0
UNIT DIVISION: 0
UNIT DIVISION: 0
UNIT DIVISION: 0
Unit division: 0
Unit division: 0
Unit division: 0
Unit division: 0
Unit division: 0
Unit division: 0

## 2017-02-17 LAB — PREPARE FRESH FROZEN PLASMA
UNIT DIVISION: 0
Unit division: 0

## 2017-02-17 LAB — BPAM RBC
BLOOD PRODUCT EXPIRATION DATE: 201806122359
BLOOD PRODUCT EXPIRATION DATE: 201806132359
BLOOD PRODUCT EXPIRATION DATE: 201806132359
Blood Product Expiration Date: 201806122359
Blood Product Expiration Date: 201806122359
Blood Product Expiration Date: 201806122359
Blood Product Expiration Date: 201806132359
Blood Product Expiration Date: 201806132359
Blood Product Expiration Date: 201806132359
Blood Product Expiration Date: 201806132359
Blood Product Expiration Date: 201806132359
ISSUE DATE / TIME: 201805160837
ISSUE DATE / TIME: 201805160837
ISSUE DATE / TIME: 201805160837
ISSUE DATE / TIME: 201805160837
ISSUE DATE / TIME: 201805161441
ISSUE DATE / TIME: 201805161441
ISSUE DATE / TIME: 201805161759
UNIT TYPE AND RH: 5100
UNIT TYPE AND RH: 5100
UNIT TYPE AND RH: 5100
Unit Type and Rh: 5100
Unit Type and Rh: 5100
Unit Type and Rh: 5100
Unit Type and Rh: 5100
Unit Type and Rh: 5100
Unit Type and Rh: 5100
Unit Type and Rh: 5100
Unit Type and Rh: 5100

## 2017-02-17 LAB — BPAM FFP
Blood Product Expiration Date: 201805202359
Blood Product Expiration Date: 201805202359
ISSUE DATE / TIME: 201805162224
ISSUE DATE / TIME: 201805170027
Unit Type and Rh: 5100
Unit Type and Rh: 6200

## 2017-02-17 LAB — GLUCOSE, CAPILLARY
GLUCOSE-CAPILLARY: 114 mg/dL — AB (ref 65–99)
GLUCOSE-CAPILLARY: 110 mg/dL — AB (ref 65–99)
Glucose-Capillary: 154 mg/dL — ABNORMAL HIGH (ref 65–99)
Glucose-Capillary: 149 mg/dL — ABNORMAL HIGH (ref 65–99)
Glucose-Capillary: 83 mg/dL (ref 65–99)

## 2017-02-17 LAB — COOXEMETRY PANEL
CARBOXYHEMOGLOBIN: 1.3 % (ref 0.5–1.5)
Carboxyhemoglobin: 1.2 % (ref 0.5–1.5)
METHEMOGLOBIN: 1.3 % (ref 0.0–1.5)
Methemoglobin: 1 % (ref 0.0–1.5)
O2 SAT: 65.7 %
O2 SAT: 63.1 %
Total hemoglobin: 10.5 g/dL — ABNORMAL LOW (ref 12.0–16.0)
Total hemoglobin: 10.4 g/dL — ABNORMAL LOW (ref 12.0–16.0)

## 2017-02-17 LAB — CBC
HEMATOCRIT: 32 % — AB (ref 36.0–46.0)
Hemoglobin: 10.5 g/dL — ABNORMAL LOW (ref 12.0–15.0)
MCH: 29.2 pg (ref 26.0–34.0)
MCHC: 32.8 g/dL (ref 30.0–36.0)
MCV: 89.1 fL (ref 78.0–100.0)
PLATELETS: 191 10*3/uL (ref 150–400)
RBC: 3.59 MIL/uL — ABNORMAL LOW (ref 3.87–5.11)
RDW: 15.7 % — AB (ref 11.5–15.5)
WBC: 22.7 10*3/uL — AB (ref 4.0–10.5)

## 2017-02-17 LAB — BASIC METABOLIC PANEL
Anion gap: 9 (ref 5–15)
BUN: 16 mg/dL (ref 6–20)
CO2: 24 mmol/L (ref 22–32)
Calcium: 8.5 mg/dL — ABNORMAL LOW (ref 8.9–10.3)
Chloride: 104 mmol/L (ref 101–111)
Creatinine, Ser: 0.87 mg/dL (ref 0.44–1.00)
GFR calc Af Amer: >60 mL/min (ref >60)
Glucose, Bld: 130 mg/dL — ABNORMAL HIGH (ref 65–99)
Potassium: 4.3 mmol/L (ref 3.5–5.1)
SODIUM: 137 mmol/L (ref 135–145)

## 2017-02-17 MED ORDER — FUROSEMIDE 10 MG/ML IJ SOLN
20.0000 mg | Freq: Three times a day (TID) | INTRAMUSCULAR | Status: DC
  Administered 2017-02-17 – 2017-02-19 (×7): 20 mg via INTRAVENOUS
  Filled 2017-02-17 (×7): qty 2

## 2017-02-17 MED ORDER — DIGOXIN 125 MCG PO TABS
0.1250 mg | ORAL_TABLET | Freq: Every morning | ORAL | Status: DC
  Administered 2017-02-17 – 2017-02-21 (×5): 0.125 mg via ORAL
  Filled 2017-02-17 (×5): qty 1

## 2017-02-17 MED ORDER — LISINOPRIL 5 MG PO TABS
5.0000 mg | ORAL_TABLET | Freq: Every day | ORAL | Status: DC
  Administered 2017-02-17 – 2017-02-18 (×2): 5 mg via ORAL
  Filled 2017-02-17 (×2): qty 1

## 2017-02-17 MED ORDER — DULOXETINE HCL 30 MG PO CPEP
30.0000 mg | ORAL_CAPSULE | Freq: Every day | ORAL | Status: DC
  Administered 2017-02-17 – 2017-02-20 (×4): 30 mg via ORAL
  Filled 2017-02-17 (×5): qty 1

## 2017-02-17 MED ORDER — DULOXETINE HCL 60 MG PO CPEP
60.0000 mg | ORAL_CAPSULE | Freq: Every day | ORAL | Status: DC
  Administered 2017-02-17 – 2017-02-21 (×5): 60 mg via ORAL
  Filled 2017-02-17 (×5): qty 1

## 2017-02-17 MED ORDER — DOPAMINE-DEXTROSE 3.2-5 MG/ML-% IV SOLN
3.0000 ug/kg/min | INTRAVENOUS | Status: DC
  Administered 2017-02-18: 3 ug/kg/min via INTRAVENOUS
  Filled 2017-02-17: qty 250

## 2017-02-17 MED FILL — Magnesium Sulfate Inj 50%: INTRAMUSCULAR | Qty: 10 | Status: AC

## 2017-02-17 MED FILL — Potassium Chloride Inj 2 mEq/ML: INTRAVENOUS | Qty: 10 | Status: AC

## 2017-02-17 MED FILL — Heparin Sodium (Porcine) Inj 1000 Unit/ML: INTRAMUSCULAR | Qty: 30 | Status: AC

## 2017-02-17 NOTE — Plan of Care (Signed)
Problem: Respiratory: Goal: Respiratory status will improve Outcome: Progressing Pt extubated 5/17

## 2017-02-17 NOTE — Progress Notes (Signed)
CT surgery p.m. Rounds  Patient resting comfortably in bed Was up in chair today We'll have physical therapy help with her mobility strength and reconditioning Last for SLT evaluation because of some coughing with oral intake

## 2017-02-17 NOTE — Progress Notes (Addendum)
[facility] DAILY ICU PROGRESS NOTE                   [address]            [place] [address]          [phone]   2 Days Post-Op Procedure(s) (LRB): BENTALL AORTIC ROOT REPLACEMENT PROCEDURE, REPAIR ASCENDING AORTIC DISSECTION (N/A) TRANSESOPHAGEAL ECHOCARDIOGRAM (TEE) (N/A) CLIPPING OF ATRIAL APPENDAGE (Left) DRAINAGE OF PERICARDIAL FLUID CORONARY ARTERY BYPASS GRAFTING (CABG) x  one, using left saphenous vein (N/A) AORTIC VALVE REPLACEMENT (AVR) (N/A)  Total Length of Stay:  LOS: 5 days   Subjective:  Patient is confused this morning.  She thinks she is at the lab and working out.  She knows what year it is.  She has some pain.  Otherwise does not have many complaints.  Objective: Vital signs in last 24 hours: Temp:  [97.6 F (36.4 C)-98.4 F (36.9 C)] 98.3 F (36.8 C) (05/17 2000) Pulse Rate:  [34-132] 95 (05/18 0500) Cardiac Rhythm: Atrial fibrillation (05/17 2000) Resp:  [0-27] 9 (05/18 0500) BP: (79-140)/(57-122) 140/122 (05/18 0500) SpO2:  [83 %-97 %] 93 % (05/18 0500) Arterial Line BP: (94-159)/(51-97) 144/80 (05/18 0500) FiO2 (%):  [40 %] 40 % (05/17 0759) Weight:  [156 lb 12.8 oz (71.1 kg)] 156 lb 12.8 oz (71.1 kg) (05/18 0600)  Filed Weights   02/15/17 0500 02/16/17 0248 02/17/17 0600  Weight: 133 lb 9.6 oz (60.6 kg) 156 lb 15.5 oz (71.2 kg) 156 lb 12.8 oz (71.1 kg)    Weight change: -2.7 oz (-0.076 kg)   Hemodynamic parameters for last 24 hours: PAP: (36-53)/(16-32) 36/21 CO:  [2.8 L/min] 2.8 L/min CI:  [1.7 L/min/m2] 1.7 L/min/m2  Intake/Output from previous day: 05/17 0701 - 05/18 0700 In: 1011.9 [I.V.:811.9; IV Piggyback:200] Out: 9518 [Urine:2025; Emesis/NG output:400; Drains:332; Chest Tube:1060]  Intake/Output this shift: No intake/output data recorded.  Current Meds: Scheduled Meds: . acetaminophen  1,000 mg Oral Q6H  . aspirin EC  325 mg Oral Daily  . bisacodyl  10 mg Oral Daily   Or  . bisacodyl  10 mg Rectal Daily    . Chlorhexidine Gluconate Cloth  6 each Topical Q0600  . digoxin  0.125 mg Oral q morning - 10a  . docusate sodium  200 mg Oral Daily  . DULoxetine  30 mg Oral QHS  . DULoxetine  60 mg Oral Daily  . furosemide  20 mg Intravenous Q8H  . insulin aspart  0-24 Units Subcutaneous Q4H  . levothyroxine  100 mcg Oral QAC breakfast  . mouth rinse  15 mL Mouth Rinse BID  . pantoprazole  80 mg Oral Daily  . sodium chloride flush  3 mL Intravenous Q12H   Continuous Infusions: . sodium chloride 250 mL (02/17/17 0600)  . sodium chloride    . cefUROXime (ZINACEF)  IV Stopped (02/16/17 1700)  . DOPamine 2.992 mcg/kg/min (02/17/17 0600)  . lactated ringers    . lactated ringers    . milrinone 0.374 mcg/kg/min (02/17/17 0600)  . norepinephrine (LEVOPHED) Adult infusion 0 mcg/min (02/16/17 1900)  . norepinephrine (LEVOPHED) Adult infusion 3.013 mcg/min (02/16/17 0600)  . vasopressin (PITRESSIN) infusion - *FOR SHOCK* 0.04 Units/min (02/17/17 0600)   PRN Meds:.metoprolol tartrate, morphine injection, ondansetron (ZOFRAN) IV, sodium chloride flush, traMADol  General appearance: alert, cooperative and no distress Heart: regular rate and rhythm Lungs: clear to auscultation bilaterally Abdomen: soft, non-tender; bowel sounds normal; no masses,  no organomegaly Extremities: LLE  thigh is very edematous, continues to have blood tinged drainage, JP in place... no edema present in BLE Wound: clean, bloody drainage from Left Thigh  Lab Results: CBC: Recent Labs  02/16/17 1647 02/16/17 1658 02/17/17 0513  WBC 18.6*  --  22.7*  HGB 10.4* 9.9* 10.5*  HCT 30.7* 29.0* 32.0*  PLT 160  --  191   BMET:  Recent Labs  02/16/17 0309  02/16/17 1658 02/17/17 0513  NA 137  --  137 137  K 3.1*  --  4.5 4.3  CL 102  --  102 104  CO2 26  --   --  24  GLUCOSE 91  --  144* 130*  BUN 9  --  15 16  CREATININE 0.70  < > 0.70 0.87  CALCIUM 7.8*  --   --  8.5*  < > = values in this interval not displayed.   CMET: Lab Results  Component Value Date   WBC 22.7 (H) 02/17/2017   HGB 10.5 (L) 02/17/2017   HCT 32.0 (L) 02/17/2017   PLT 191 02/17/2017   GLUCOSE 130 (H) 02/17/2017   CHOL 168 01/11/2017   TRIG 111 01/11/2017   HDL 41 01/11/2017   LDLCALC 105 (H) 01/11/2017   ALT 13 (L) 02/13/2017   AST 16 02/13/2017   NA 137 02/17/2017   K 4.3 02/17/2017   CL 104 02/17/2017   CREATININE 0.87 02/17/2017   BUN 16 02/17/2017   CO2 24 02/17/2017   INR 1.55 02/15/2017   HGBA1C 5.5 02/13/2017      PT/INR:  Recent Labs  02/15/17 1930  LABPROT 18.7*  INR 1.55   Radiology: No results found.   Assessment/Plan: S/P Procedure(s) (LRB): BENTALL AORTIC ROOT REPLACEMENT PROCEDURE, REPAIR ASCENDING AORTIC DISSECTION (N/A) TRANSESOPHAGEAL ECHOCARDIOGRAM (TEE) (N/A) CLIPPING OF ATRIAL APPENDAGE (Left) DRAINAGE OF PERICARDIAL FLUID CORONARY ARTERY BYPASS GRAFTING (CABG) x  one, using left saphenous vein (N/A) AORTIC VALVE REPLACEMENT (AVR) (N/A)  1. CV- Chronic A. Fib, + HTN- weaning Milrinone, off all other drips- will start low dose Lisinopril for HTN 2. Pulm- wean oxygen as tolerated, CXR with minimal atelectasis, Chest tube output remains high leave both sets of chest tubes in place today 3. Renal- creatinine WNL, remains hypervolemic weight is up about 24 lbs... Continue IV Lasix, potassium supplementation 4. ID- Leukocytosis up to 22.7, remains afebrile..likely stress response due to high dose steroids and circulatory arrest, will monitor if doesn't improve may need to workup in a few days 5. Expected post operative blood loss anemia- Hgb at 10.5 6. CBGS controlled- not a diabetic, will continue to monitor for now 7. Dispo- patient weaning Milrinone as tolerated, off all other drips... Will add lisinopril for BP control, continue chest tubes for increased output.. Increasing Leukocytosis, afrebrile.. Watch for now will discuss need for workup with staff     Ellwood Handler 02/17/2017  7:43 AM   Patient currently asleep. Was confused earlier- not surprising given age, medical issues Milrinone decreased to 0.25 this AM, still on low dose dopamine as well Continue diuresis Leukocytosis likely reactive after long pump run- follow  Remo Lipps C. Roxan Hockey, MD [facility] [phone]

## 2017-02-18 ENCOUNTER — Inpatient Hospital Stay (HOSPITAL_COMMUNITY): Payer: Medicare Other

## 2017-02-18 DIAGNOSIS — Z9889 Other specified postprocedural states: Secondary | ICD-10-CM

## 2017-02-18 DIAGNOSIS — Z8679 Personal history of other diseases of the circulatory system: Secondary | ICD-10-CM

## 2017-02-18 LAB — COMPREHENSIVE METABOLIC PANEL
ALT: 22 U/L (ref 14–54)
AST: 36 U/L (ref 15–41)
Albumin: 2.7 g/dL — ABNORMAL LOW (ref 3.5–5.0)
Alkaline Phosphatase: 85 U/L (ref 38–126)
Anion gap: 8 (ref 5–15)
BILIRUBIN TOTAL: 1.2 mg/dL (ref 0.3–1.2)
BUN: 18 mg/dL (ref 6–20)
CALCIUM: 8.5 mg/dL — AB (ref 8.9–10.3)
CO2: 29 mmol/L (ref 22–32)
CREATININE: 0.81 mg/dL (ref 0.44–1.00)
Chloride: 100 mmol/L — ABNORMAL LOW (ref 101–111)
GFR calc Af Amer: >60 mL/min (ref >60)
Glucose, Bld: 96 mg/dL (ref 65–99)
Potassium: 3.4 mmol/L — ABNORMAL LOW (ref 3.5–5.1)
Sodium: 137 mmol/L (ref 135–145)
TOTAL PROTEIN: 5.7 g/dL — AB (ref 6.5–8.1)

## 2017-02-18 LAB — CBC
HEMATOCRIT: 32.3 % — AB (ref 36.0–46.0)
Hemoglobin: 10.3 g/dL — ABNORMAL LOW (ref 12.0–15.0)
MCH: 28.8 pg (ref 26.0–34.0)
MCHC: 31.9 g/dL (ref 30.0–36.0)
MCV: 90.2 fL (ref 78.0–100.0)
Platelets: 198 10*3/uL (ref 150–400)
RBC: 3.58 MIL/uL — AB (ref 3.87–5.11)
RDW: 15.2 % (ref 11.5–15.5)
WBC: 21.2 10*3/uL — AB (ref 4.0–10.5)

## 2017-02-18 LAB — GLUCOSE, CAPILLARY
GLUCOSE-CAPILLARY: 82 mg/dL (ref 65–99)
GLUCOSE-CAPILLARY: 109 mg/dL — AB (ref 65–99)
Glucose-Capillary: 117 mg/dL — ABNORMAL HIGH (ref 65–99)
Glucose-Capillary: 86 mg/dL (ref 65–99)
Glucose-Capillary: 119 mg/dL — ABNORMAL HIGH (ref 65–99)
Glucose-Capillary: 71 mg/dL (ref 65–99)

## 2017-02-18 LAB — COOXEMETRY PANEL
Carboxyhemoglobin: 1.6 % — ABNORMAL HIGH (ref 0.5–1.5)
Methemoglobin: 0.9 % (ref 0.0–1.5)
O2 Saturation: 67.7 %
TOTAL HEMOGLOBIN: 10.6 g/dL — AB (ref 12.0–16.0)

## 2017-02-18 LAB — POCT I-STAT, CHEM 8
BUN: 24 mg/dL — ABNORMAL HIGH (ref 6–20)
Calcium, Ion: 1.19 mmol/L (ref 1.15–1.40)
Chloride: 96 mmol/L — ABNORMAL LOW (ref 101–111)
Creatinine, Ser: 0.7 mg/dL (ref 0.44–1.00)
Glucose, Bld: 132 mg/dL — ABNORMAL HIGH (ref 65–99)
HEMATOCRIT: 33 % — AB (ref 36.0–46.0)
HEMOGLOBIN: 11.2 g/dL — AB (ref 12.0–15.0)
POTASSIUM: 4.3 mmol/L (ref 3.5–5.1)
SODIUM: 134 mmol/L — AB (ref 135–145)
TCO2: 28 mmol/L (ref 0–100)

## 2017-02-18 MED ORDER — RESOURCE THICKENUP CLEAR PO POWD
ORAL | Status: DC | PRN
  Filled 2017-02-18: qty 125

## 2017-02-18 MED ORDER — POTASSIUM CHLORIDE 10 MEQ/50ML IV SOLN
10.0000 meq | INTRAVENOUS | Status: AC | PRN
  Administered 2017-02-18 (×3): 10 meq via INTRAVENOUS
  Filled 2017-02-18 (×3): qty 50

## 2017-02-18 MED ORDER — SILDENAFIL CITRATE 20 MG PO TABS
20.0000 mg | ORAL_TABLET | Freq: Three times a day (TID) | ORAL | Status: DC
  Administered 2017-02-18: 20 mg via ORAL
  Filled 2017-02-18: qty 1

## 2017-02-18 MED ORDER — SODIUM CHLORIDE 0.9 % IV SOLN
0.0000 ug/min | INTRAVENOUS | Status: DC
  Administered 2017-02-18: 20 ug/min via INTRAVENOUS
  Filled 2017-02-18: qty 1

## 2017-02-18 MED ORDER — INSULIN ASPART 100 UNIT/ML ~~LOC~~ SOLN: 0.0000 [IU] | Freq: Every day | SUBCUTANEOUS | Status: DC

## 2017-02-18 MED ORDER — ENOXAPARIN SODIUM 30 MG/0.3ML ~~LOC~~ SOLN
30.0000 mg | SUBCUTANEOUS | Status: DC
  Administered 2017-02-18 – 2017-02-20 (×3): 30 mg via SUBCUTANEOUS
  Filled 2017-02-18 (×3): qty 0.3

## 2017-02-18 MED ORDER — INSULIN ASPART 100 UNIT/ML ~~LOC~~ SOLN: 0.0000 [IU] | Freq: Three times a day (TID) | SUBCUTANEOUS | Status: DC

## 2017-02-18 NOTE — Progress Notes (Signed)
Pt became hypotensive and lethargic. MD paged new orders to start neo. Will continue to assess and monitor the pt closely.

## 2017-02-18 NOTE — Progress Notes (Signed)
Peripherally Inserted Central Catheter/Midline Placement  The IV Nurse has discussed with the patient and/or persons authorized to consent for the patient, the purpose of this procedure and the potential benefits and risks involved with this procedure.  The benefits include less needle sticks, lab draws from the catheter, and the patient may be discharged home with the catheter. Risks include, but not limited to, infection, bleeding, blood clot (thrombus formation), and puncture of an artery; nerve damage and irregular heartbeat and possibility to perform a PICC exchange if needed/ordered by physician.  Alternatives to this procedure were also discussed.  Bard Power PICC patient education guide, fact sheet on infection prevention and patient information card has been provided to patient /or left at bedside.  Daughter at bedside gave consent.  Pt with intermittent confusion also verbally agreeable to PICC placement.  Pt and dtr aware of PICC placement after blood culture results back on 02-19-17  PICC/Midline Placement Documentation        Lydia Jordan 02/18/2017, 5:10 PM

## 2017-02-18 NOTE — Evaluation (Addendum)
Clinical/Bedside Swallow Evaluation Patient Details  Name: Lydia Jordan MRN: [id number] Date of Birth: December 16, 1933  Today's Date: 02/18/2017 Time: SLP Start Time (ACUTE ONLY): 0930 SLP Stop Time (ACUTE ONLY): 0950 SLP Time Calculation (min) (ACUTE ONLY): 20 min  Past Medical History:  Past Medical History:  Diagnosis Date  . Acid reflux   . Arthritis   . Ascending aortic aneurysm ([place]) 01/11/2017  . Ascending aortic dissection ([place]) 02/11/2017  . CAD (coronary artery disease)    Cath 01/11/17 showed 50% ost RCA and 50% mLAD --> medical therapy   . Cervical disc disease   . Chronic pain    Secondary to degenerative disc disease and arthritis  . Degenerative arthritis   . Hydronephrosis of right kidney   . Hypertension   . Hypothyroidism   . Hypothyroidism   . Longstanding persistent atrial fibrillation ([place])    originally diagnosed 2007  . NSTEMI (non-ST elevated myocardial infarction) ([place]) 01/10/2017  . Pubic ramus fracture, right, closed, initial encounter ([place]) 02/12/2017  . S/P ascending aortic dissection repair 02/15/2017   Straight graft replacement of sub-acute ascending aortic dissection with hemi-arch distal aortic reconstruction  . 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 AORTIC VALVE REPLACEMENT N/A 02/15/2017   Procedure: AORTIC VALVE REPLACEMENT (AVR);  Surgeon: [name], MD;  Location: [facility];  Service: Open Heart Surgery;  Laterality: N/A;  . BENTALL PROCEDURE N/A 02/15/2017   Procedure: BENTALL AORTIC ROOT REPLACEMENT PROCEDURE, REPAIR ASCENDING AORTIC DISSECTION;  Surgeon: [name], MD;  Location: [facility];  Service: Open Heart Surgery;   Laterality: N/A;  . CLIPPING OF ATRIAL APPENDAGE Left 02/15/2017   Procedure: CLIPPING OF ATRIAL APPENDAGE;  Surgeon: [name], MD;  Location: [facility];  Service: Open Heart Surgery;  Laterality: Left;  . CORONARY ARTERY BYPASS GRAFT N/A 02/15/2017   Procedure: CORONARY ARTERY BYPASS GRAFTING (CABG) x  one, using left saphenous vein;  Surgeon: [name], MD;  Location[facility];  Service: Vascular;  Laterality: N/A;  . ESOPHAGEAL MANOMETRY N/A 07/31/2015   Procedure: ESOPHAGEAL MANOMETRY (EM);  Surgeon: Arta Silence, MD;  Location: [facility];  Service: Endoscopy;  Laterality: N/A;  . ESOPHAGOGASTRODUODENOSCOPY (EGD) WITH PROPOFOL N/A 09/02/2015   Procedure: ESOPHAGOGASTRODUODENOSCOPY (EGD) WITH PROPOFOL;  Surgeon: Arta Silence, MD;  Location: [facility];  Service: Endoscopy;  Laterality: N/A;  botulinum toxin injection (100 Units; 25 units in 4 quadrants 1-2 cm proximal to GE junction  . LEFT HEART CATH AND CORONARY ANGIOGRAPHY N/A 01/11/2017   Procedure: Left Heart Cath and Coronary Angiography;  Surgeon: Leonie Man, MD;  Location: [facility];  Service: Cardiovascular;  Laterality: N/A;  . PERICARDIAL FLUID DRAINAGE  02/15/2017   Procedure: DRAINAGE OF PERICARDIAL FLUID;  Surgeon: [name], MD;  Location: [facility];  Service: Vascular;;  . skin cancer area removed      right leg healing well  . TEE WITHOUT CARDIOVERSION N/A 02/15/2017   Procedure: TRANSESOPHAGEAL ECHOCARDIOGRAM (TEE);  Surgeon: [name], MD;  Location: [facility];  Service: Open Heart Surgery;  Laterality: N/A;  . VAGINAL HYSTERECTOMY     HPI:  Pt is an 83 y.o. female with PMH of atrial fibrillation  on eliquis, HTN, hypothyroidism, acid reflux, recent NSTEMI seen at the request of Dr. Maryan Rued for type A aortic dissection. Pt had a mechanical fall on 5/9, during which time she bruised her face and hip. She had pain after the fall and felt she needed to be seen, therefore presenting 5/12. Given  that she is on anticoagulation, she underwent CT of head and pelvis. Initial CT concerning for unclear mass vs. Consolidation at left lung apex, so repeat CT with contrast performed. CT showed type A dissection. Underwent CT surgery on 5/16 and was extubated 5/17. CXR 5/19 showed mild bilateral atelectasis. Bedside swallow eval ordered due to coughing observed with oral intake per MD. Pt currently on dysphagia 1 diet/ nectar thick liquids.   Assessment / Plan / Recommendation Clinical Impression  Pt showed intermittent s/s of possible aspiration including occasional throat clear with liquid consistencies which was more frequent with thin liquids than nectar-thick liquids; cough following trial of regular solid consistency. Pt with hx of esophageal issues; an esophagram in 2016 showed "faint penetration of barium, significant tertiary contractions in the mid and distal esophagus, short segment stricture of the distal esophagus just above the small hiatal hernia. The barium pill lodges at the level of the short segment distal esophageal stricture. Prominent cricopharyngeus muscle." Pt reported having botox injection at the lower esophageal sphincter but stated it only helped "about 6 months". Given this history along with recent intubation for procedure, pt is at an increased risk of aspiration. Pt was put on a dysphagia 1/ nectar-thick liqiuids diet; recommend continuing this as pt showed fewer s/s of aspiration with nectar thick liquids; meds whole in puree. Ensure pt seated upright 30 minutes after meal, intermittent supervision. Also recommend proceeding with MBS to objectively evaluate swallow function- plan for MBS next date, 5/20. Pt may also benefit from further esophageal assessment. Will continue to follow.  SLP Visit Diagnosis: Dysphagia, unspecified (R13.10)    Aspiration Risk  Moderate aspiration risk    Diet Recommendation Dysphagia 1 (Puree);Nectar-thick liquid   Liquid Administration via:  Cup;Straw Medication Administration: Whole meds with puree Supervision: Patient able to self feed;Intermittent supervision to cue for compensatory strategies Compensations: Slow rate;Small sips/bites;Follow solids with liquid Postural Changes: Seated upright at 90 degrees;Remain upright for at least 30 minutes after po intake    Other  Recommendations Recommended Consults: Consider esophageal assessment Oral Care Recommendations: Oral care BID Other Recommendations: Order thickener from pharmacy   Follow up Recommendations Other (comment) (TBD)      Frequency and Duration min 1 x/week   (TBD)       Prognosis Prognosis for Safe Diet Advancement:  (pending MBS results)      Swallow Study   General HPI: Pt is an 83 y.o. female with PMH of atrial fibrillation on eliquis, HTN, hypothyroidism, acid reflux, recent NSTEMI seen at the request of Dr. Maryan Rued for type A aortic dissection. Pt had a mechanical fall on 5/9, during which time she bruised her face and hip. She had pain after the fall and felt she needed to be seen, therefore presenting 5/12. Given that she is on anticoagulation, she underwent CT of head and pelvis. Initial CT concerning for unclear mass vs. Consolidation at left lung apex, so repeat CT with contrast performed. CT showed type A dissection. Underwent CT surgery on 5/16 and was extubated 5/17. CXR 5/19 showed mild bilateral atelectasis. Bedside swallow eval ordered due to coughing observed with oral intake per MD. Pt currently on dysphagia 1 diet/  nectar thick liquids. Type of Study: Bedside Swallow Evaluation Previous Swallow Assessment:  (none in chart) Diet Prior to this Study: Dysphagia 1 (puree);Nectar-thick liquids Temperature Spikes Noted: No Respiratory Status: Nasal cannula History of Recent Intubation: Yes Length of Intubations (days):  (1) Date extubated: 02/16/17 Behavior/Cognition: Alert;Cooperative;Pleasant mood Oral Cavity Assessment: Within Functional  Limits Oral Care Completed by SLP: No Oral Cavity - Dentition: Adequate natural dentition Vision: Functional for self-feeding Self-Feeding Abilities: Able to feed self Patient Positioning: Upright in bed Baseline Vocal Quality: Normal Volitional Cough: Strong Volitional Swallow: Able to elicit    Oral/Motor/Sensory Function Overall Oral Motor/Sensory Function: Within functional limits   Ice Chips Ice chips: Not tested   Thin Liquid Thin Liquid: Impaired Presentation: Cup;Straw Pharyngeal  Phase Impairments: Throat Clearing - Immediate    Nectar Thick Nectar Thick Liquid: Impaired Presentation: Straw Pharyngeal Phase Impairments: Throat Clearing - Delayed   Honey Thick Honey Thick Liquid: Not tested   Puree Puree: Within functional limits Presentation: Self Fed;Spoon   Solid   GO   Solid: Impaired Pharyngeal Phase Impairments: Cough - Delayed        Amy K Oleksiak, MA, CCC-SLP 02/18/2017,9:58 AM

## 2017-02-18 NOTE — Progress Notes (Signed)
Spoke with RN regarding PICC line.  Blood Cultures pending.  RN spoke with doctor who states to wait until 11-22-16 after cultures are negative 48 hours.

## 2017-02-18 NOTE — Progress Notes (Addendum)
3 Days Post-Op Procedure(s) (LRB): BENTALL AORTIC ROOT REPLACEMENT PROCEDURE, REPAIR ASCENDING AORTIC DISSECTION (N/A) TRANSESOPHAGEAL ECHOCARDIOGRAM (TEE) (N/A) CLIPPING OF ATRIAL APPENDAGE (Left) DRAINAGE OF PERICARDIAL FLUID CORONARY ARTERY BYPASS GRAFTING (CABG) x  one, using left saphenous vein (N/A) AORTIC VALVE REPLACEMENT (AVR) (N/A) Subjective: OOB to chair SLT eval in progress Chronic afib Weaning inotropes Diuresing well CXR clear Chest tube output decreased  Objective: Vital signs in last 24 hours: Temp:  [97.6 F (36.4 C)-98.3 F (36.8 C)] 98.1 F (36.7 C) (05/19 0910) Pulse Rate:  [86-107] 94 (05/19 0800) Cardiac Rhythm: Atrial fibrillation (05/19 0800) Resp:  [11-24] 20 (05/19 0800) BP: (95-157)/(67-128) 113/86 (05/19 0800) SpO2:  [85 %-100 %] 100 % (05/19 0800) Arterial Line BP: (122-159)/(68-86) 143/79 (05/19 0800) Weight:  [154 lb 5.2 oz (70 kg)] 154 lb 5.2 oz (70 kg) (05/19 0600)  Hemodynamic parameters for last 24 hours:  stable  Intake/Output from previous day: 05/18 0701 - 05/19 0700 In: 147.9 [I.V.:147.9] Out: 2722 [Urine:1650; Drains:372; Chest Tube:700] Intake/Output this shift: Total I/O In: 230.5 [P.O.:120; I.V.:110.5] Out: 266 [Urine:150; Chest Tube:116]       Exam    General- alert and comfortable   Lungs- clear without rales, wheezes   Cor- regular rate and rhythm, no murmur , gallop   Abdomen- soft, non-tender   Extremities - some finger tip dusky chg from vassopressin   Neuro- oriented, appropriate, no focal weakness   Lab Results:  Recent Labs  02/17/17 0513 02/18/17 0441  WBC 22.7* 21.2*  HGB 10.5* 10.3*  HCT 32.0* 32.3*  PLT 191 198   BMET:  Recent Labs  02/17/17 0513 02/18/17 0441  NA 137 137  K 4.3 3.4*  CL 104 100*  CO2 24 29  GLUCOSE 130* 96  BUN 16 18  CREATININE 0.87 0.81  CALCIUM 8.5* 8.5*    PT/INR:  Recent Labs  02/15/17 1930  LABPROT 18.7*  INR 1.55   ABG    Component Value Date/Time   PHART 7.343 (L) 02/16/2017 1104   HCO3 24.1 02/16/2017 1104   TCO2 26 02/16/2017 1658   ACIDBASEDEF 2.0 02/16/2017 1104   O2SAT 67.7 02/18/2017 0449   CBG (last 3)   Recent Labs  02/17/17 2007 02/18/17 0204 02/18/17 0435  GLUCAP 83 117* 82    Assessment/Plan: S/P Procedure(s) (LRB): BENTALL AORTIC ROOT REPLACEMENT PROCEDURE, REPAIR ASCENDING AORTIC DISSECTION (N/A) TRANSESOPHAGEAL ECHOCARDIOGRAM (TEE) (N/A) CLIPPING OF ATRIAL APPENDAGE (Left) DRAINAGE OF PERICARDIAL FLUID CORONARY ARTERY BYPASS GRAFTING (CABG) x  one, using left saphenous vein (N/A) AORTIC VALVE REPLACEMENT (AVR) (N/A)  MBS by SLT PT- mobilization Low dose lovenox for afib Acrocyanosis of fingers, toes, RV dysfunction- start  revatio- doubt HIT with  plts 190k  LOS: 6 days    Lydia Jordan 02/18/2017

## 2017-02-18 NOTE — Progress Notes (Signed)
Patient examined and record reviewed.Hemodynamics stable,labs satisfactory.Patient had stable day.Continue current care. Tharon Aquas Trigt III 02/18/2017

## 2017-02-18 NOTE — Progress Notes (Signed)
Physical Therapy Treatment Patient Details Name: Lydia Jordan MRN: [id number] DOB: [birth year] Today's Date: 02/18/2017    History of Present Illness Pt is an 83 y.o. female with a history of chronic atrial fibrillation on ELIQUIS, recent non-STEMI with minimal CAD as well as hypertension and hypothyroidism who presented after mechanical fall on May 19. She suffered a pelvic fracture. Pt underwent a CT scan which revealed a type a aortic dissection complicated by pericardial effusion. Pt is now s/p CABG, aneurysm repair and drainage of pericardial effusion on 5/16.     PT Comments    Pt seen for mobility progression and as a re-evaluation following her surgery (CABG) on 5/16. Pt requiring two person assist with transfers this session and was limited secondary to fatigue and pain. PT reviewed sternal precautions with pt and family throughout session. Pt's BP maintained at 923-300 for systolic and low 762'U for diastolic before, during and after mobility. Pt would continue to benefit from skilled physical therapy services at this time while admitted and after d/c to address the below listed limitations in order to improve overall safety and independence with functional mobility.    Follow Up Recommendations  SNF     Equipment Recommendations  None recommended by PT    Recommendations for Other Services       Precautions / Restrictions Precautions Precautions: Fall;Sternal Restrictions Weight Bearing Restrictions: No    Mobility  Bed Mobility Overal bed mobility: Needs Assistance Bed Mobility: Supine to Sit     Supine to sit: HOB elevated;Min assist     General bed mobility comments: increased time, vc'ing for sequencing, min A with trunk elevation to achieve sitting EOB  Transfers Overall transfer level: Needs assistance Equipment used: 2 person hand held assist Transfers: Sit to/from [facility] Sit to Stand: Min assist;+2 physical assistance;+2  safety/equipment Stand pivot transfers: Mod assist;+2 physical assistance;+2 safety/equipment       General transfer comment: increased time, cues to maintain UEs holding cardiac pillow, min A x2 to steady with rise from bed and heavy mod A x2 for pivotal movements from bed to chair.  Ambulation/Gait                 Stairs            Wheelchair Mobility    Modified Rankin (Stroke Patients Only)       Balance Overall balance assessment: Needs assistance Sitting-balance support: Feet supported Sitting balance-Leahy Scale: Fair Sitting balance - Comments: close min guard to sit EOB   Standing balance support: Bilateral upper extremity supported Standing balance-Leahy Scale: Poor Standing balance comment: reliant on external supports                            Cognition Arousal/Alertness: Awake/alert Behavior During Therapy: WFL for tasks assessed/performed Overall Cognitive Status: Impaired/Different from baseline Area of Impairment: Memory;Following commands;Safety/judgement;Problem solving                     Memory: Decreased short-term memory;Decreased recall of precautions Following Commands: Follows one step commands with increased time;Follows one step commands inconsistently Safety/Judgement: Decreased awareness of safety;Decreased awareness of deficits   Problem Solving: Slow processing;Decreased initiation;Difficulty sequencing;Requires verbal cues;Requires tactile cues        Exercises      General Comments        Pertinent Vitals/Pain Pain Assessment: Faces Faces Pain Scale: Hurts little more Pain Location: chest Pain Descriptors /  Indicators: Aching;Sore Pain Intervention(s): Monitored during session;Repositioned    Home Living                      Prior Function            PT Goals (current goals can now be found in the care plan section) Acute Rehab PT Goals PT Goal Formulation: With patient Time  For Goal Achievement: 02/20/17 Potential to Achieve Goals: Fair Progress towards PT goals: Progressing toward goals    Frequency    Min 3X/week      PT Plan Current plan remains appropriate    Co-evaluation              AM-PAC PT "6 Clicks" Daily Activity  Outcome Measure  Difficulty turning over in bed (including adjusting bedclothes, sheets and blankets)?: A Lot Difficulty moving from lying on back to sitting on the side of the bed? : Total Difficulty sitting down on and standing up from a chair with arms (e.g., wheelchair, bedside commode, etc,.)?: Total Help needed moving to and from a bed to chair (including a wheelchair)?: A Lot Help needed walking in hospital room?: A Lot Help needed climbing 3-5 steps with a railing? : Total 6 Click Score: 9    End of Session Equipment Utilized During Treatment: Oxygen Activity Tolerance: Patient limited by pain;Patient limited by fatigue Patient left: in chair;with call bell/phone within reach;with chair alarm set;with family/visitor present Nurse Communication: Mobility status PT Visit Diagnosis: Other abnormalities of gait and mobility (R26.89)     Time: 1355-1420 PT Time Calculation (min) (ACUTE ONLY): 25 min  Charges:  $Therapeutic Activity: 8-22 mins                    G Codes:       Dean, PT, [place] 02/18/2017, 3:20 PM

## 2017-02-19 ENCOUNTER — Inpatient Hospital Stay (HOSPITAL_COMMUNITY): Payer: Medicare Other

## 2017-02-19 LAB — GLUCOSE, CAPILLARY
GLUCOSE-CAPILLARY: 122 mg/dL — AB (ref 65–99)
GLUCOSE-CAPILLARY: 69 mg/dL (ref 65–99)
Glucose-Capillary: 69 mg/dL (ref 65–99)
Glucose-Capillary: 135 mg/dL — ABNORMAL HIGH (ref 65–99)
Glucose-Capillary: 99 mg/dL (ref 65–99)
Glucose-Capillary: 101 mg/dL — ABNORMAL HIGH (ref 65–99)

## 2017-02-19 LAB — COOXEMETRY PANEL
CARBOXYHEMOGLOBIN: 1.4 % (ref 0.5–1.5)
Methemoglobin: 1.2 % (ref 0.0–1.5)
O2 Saturation: 55.8 %
Total hemoglobin: 10.8 g/dL — ABNORMAL LOW (ref 12.0–16.0)

## 2017-02-19 LAB — BASIC METABOLIC PANEL
Anion gap: 6 (ref 5–15)
BUN: 17 mg/dL (ref 6–20)
CO2: 30 mmol/L (ref 22–32)
Calcium: 8.2 mg/dL — ABNORMAL LOW (ref 8.9–10.3)
Chloride: 97 mmol/L — ABNORMAL LOW (ref 101–111)
Creatinine, Ser: 0.75 mg/dL (ref 0.44–1.00)
GFR calc Af Amer: >60 mL/min (ref >60)
GFR calc non Af Amer: >60 mL/min (ref >60)
Glucose, Bld: 94 mg/dL (ref 65–99)
Potassium: 3.2 mmol/L — ABNORMAL LOW (ref 3.5–5.1)
Sodium: 133 mmol/L — ABNORMAL LOW (ref 135–145)

## 2017-02-19 LAB — POCT I-STAT, CHEM 8
BUN: 19 mg/dL (ref 6–20)
CALCIUM ION: 1.13 mmol/L — AB (ref 1.15–1.40)
CREATININE: 0.7 mg/dL (ref 0.44–1.00)
Chloride: 92 mmol/L — ABNORMAL LOW (ref 101–111)
Glucose, Bld: 118 mg/dL — ABNORMAL HIGH (ref 65–99)
HCT: 30 % — ABNORMAL LOW (ref 36.0–46.0)
Hemoglobin: 10.2 g/dL — ABNORMAL LOW (ref 12.0–15.0)
POTASSIUM: 3.4 mmol/L — AB (ref 3.5–5.1)
Sodium: 134 mmol/L — ABNORMAL LOW (ref 135–145)
TCO2: 30 mmol/L (ref 0–100)

## 2017-02-19 LAB — CBC
HCT: 31.7 % — ABNORMAL LOW (ref 36.0–46.0)
Hemoglobin: 10.5 g/dL — ABNORMAL LOW (ref 12.0–15.0)
MCH: 29.7 pg (ref 26.0–34.0)
MCHC: 33.1 g/dL (ref 30.0–36.0)
MCV: 89.5 fL (ref 78.0–100.0)
Platelets: 217 10*3/uL (ref 150–400)
RBC: 3.54 MIL/uL — ABNORMAL LOW (ref 3.87–5.11)
RDW: 14.7 % (ref 11.5–15.5)
WBC: 16.9 10*3/uL — ABNORMAL HIGH (ref 4.0–10.5)

## 2017-02-19 MED ORDER — POTASSIUM CHLORIDE CRYS ER 20 MEQ PO TBCR
20.0000 meq | EXTENDED_RELEASE_TABLET | Freq: Every day | ORAL | Status: DC
  Administered 2017-02-19: 20 meq via ORAL
  Filled 2017-02-19: qty 1

## 2017-02-19 MED ORDER — ENSURE ENLIVE PO LIQD: 237.0000 mL | Freq: Two times a day (BID) | ORAL | Status: DC

## 2017-02-19 MED ORDER — CARVEDILOL 3.125 MG PO TABS
3.1250 mg | ORAL_TABLET | Freq: Two times a day (BID) | ORAL | Status: DC
  Administered 2017-02-19 – 2017-02-21 (×5): 3.125 mg via ORAL
  Filled 2017-02-19 (×4): qty 1

## 2017-02-19 MED ORDER — FUROSEMIDE 10 MG/ML IJ SOLN
20.0000 mg | Freq: Two times a day (BID) | INTRAMUSCULAR | Status: DC
  Administered 2017-02-19 – 2017-02-21 (×4): 20 mg via INTRAVENOUS
  Filled 2017-02-19 (×4): qty 2

## 2017-02-19 MED ORDER — CARVEDILOL 3.125 MG PO TABS
3.1250 mg | ORAL_TABLET | Freq: Two times a day (BID) | ORAL | Status: DC
  Filled 2017-02-19: qty 1

## 2017-02-19 MED ORDER — ENSURE ENLIVE PO LIQD
237.0000 mL | Freq: Three times a day (TID) | ORAL | Status: DC
  Administered 2017-02-19 – 2017-02-21 (×6): 237 mL via ORAL

## 2017-02-19 MED ORDER — POTASSIUM CHLORIDE 10 MEQ/50ML IV SOLN
10.0000 meq | INTRAVENOUS | Status: AC | PRN
  Administered 2017-02-19 (×3): 10 meq via INTRAVENOUS
  Filled 2017-02-19 (×2): qty 50

## 2017-02-19 MED ORDER — CHLORHEXIDINE GLUCONATE CLOTH 2 % EX PADS: 6.0000 | MEDICATED_PAD | Freq: Every day | CUTANEOUS | Status: DC

## 2017-02-19 MED ORDER — SODIUM CHLORIDE 0.9% FLUSH
10.0000 mL | INTRAVENOUS | Status: DC | PRN
  Administered 2017-02-20: 30 mL
  Administered 2017-02-21: 10 mL
  Filled 2017-02-19 (×2): qty 40

## 2017-02-19 NOTE — Progress Notes (Signed)
CT surgery p.m. Rounds  Patient attempted ambulate but had pain from pubic ramus fracture Barium swallow shows prominent cricopharyngeus muscle, tertiary mid esophageal contractions, and lower esophageal stricture. No aspiration  Full liquid diet recommended by speech therapy PICC line been placed Continue low-dose milrinone

## 2017-02-19 NOTE — Progress Notes (Signed)
Pt continues to have difficultly swallowing with recommended diet. Pt was able to stand and step-up on scale but too weak to progress further with walking this morning. Pt is currently resting comfortable in chair, vital signs stable, will continue to monitor.

## 2017-02-19 NOTE — Progress Notes (Signed)
Pt CBG 71, rechecked 1 hour later with CBG result of 69. Pt given PO juice with thicken up and pudding. Rechecked CBG 122. Pt did not have much to eat during the day due to swallowing difficulties and continues to have issues. Pt was asymptomatic during hypoglycemic event. Will continue to monitor.

## 2017-02-19 NOTE — Consult Note (Signed)
[facility] Nurse wound consult note Reason for Consult: Patient is seen at the outpatient wound care center at [facility].  Lesions at the left great toe, lateral aspect and right foot, distal 2nd, 3rd and 4th digits are dry and stable. Wound type: arterial insufficiency Pressure Injury POA: No Measurement: Left great toe, lateral aspect: 0.8cm x 0.4cm with no depth. Dried serum. Right foot: second, third and 4th digits, distal tips with dry eschars, the largest measures 0.4cm x 0.4cm round. Wound bed:dry, stable eschar present Drainage (amount, consistency, odor): None. Periwound: Inact, dry.  Right foot, 3rd digit is pale purple.  Patient and her daughter state that this is not a new finding. Dressing procedure/placement/frequency: I have provided Nursing with guidance for daily cleansing of the bilateral LEs and for light application of Eucerin cream-except to the intra digital areas.  We will pain the distal lesions at the tips of the left toes with a betadine swabstick and allow to air-dry to keep infection free and the escharotic lesions stable. Patient should continue to follow at the outpatient [facility]  post discharge as indicated by the CVTS team. [place] nursing team will not follow, but will remain available to this patient, the nursing and medical teams.  Please re-consult if needed. Thanks, Maudie Flakes, MSN, RN, [place], Arther Abbott  Pager# [phone]

## 2017-02-19 NOTE — Progress Notes (Signed)
4 Days Post-Op Procedure(s) (LRB): BENTALL AORTIC ROOT REPLACEMENT PROCEDURE, REPAIR ASCENDING AORTIC DISSECTION (N/A) TRANSESOPHAGEAL ECHOCARDIOGRAM (TEE) (N/A) CLIPPING OF ATRIAL APPENDAGE (Left) DRAINAGE OF PERICARDIAL FLUID CORONARY ARTERY BYPASS GRAFTING (CABG) x  one, using left saphenous vein (N/A) AORTIC VALVE REPLACEMENT (AVR) (N/A) Subjective: Not ambulatory due to pelvic ramus fx diuresing well Remove MTs today Ba Swa today as rec by SLT- current diet D-I Co-ox 56%- cont milrinone , add carvedilol, bun 24 Objective: Vital signs in last 24 hours: Temp:  [98.2 F (36.8 C)-98.9 F (37.2 C)] 98.5 F (36.9 C) (05/20 0800) Pulse Rate:  [81-117] 93 (05/20 0930) Cardiac Rhythm: Atrial fibrillation (05/20 0800) Resp:  [14-29] 22 (05/20 0930) BP: (90-149)/(60-114) 147/101 (05/20 0930) SpO2:  [88 %-100 %] 98 % (05/20 0930) Arterial Line BP: (126)/(64) 126/64 (05/19 1000) Weight:  [144 lb 13.5 oz (65.7 kg)] 144 lb 13.5 oz (65.7 kg) (05/20 0500)  Hemodynamic parameters for last 24 hours:  stable  Intake/Output from previous day: 05/19 0701 - 05/20 0700 In: 646.4 [P.O.:240; I.V.:406.4] Out: 2791 [Urine:2205; Drains:290; Chest Tube:296] Intake/Output this shift: Total I/O In: 213.9 [I.V.:63.9; IV Piggyback:150] Out: 910 [Urine:775; Drains:75; Chest Tube:60]  Incisions clean Leg JP with seour output Lungs cleat  Lab Results:  Recent Labs  02/18/17 0441 02/18/17 1527 02/19/17 0437  WBC 21.2*  --  16.9*  HGB 10.3* 11.2* 10.5*  HCT 32.3* 33.0* 31.7*  PLT 198  --  217   BMET:  Recent Labs  02/18/17 0441 02/18/17 1527 02/19/17 0437  NA 137 134* 133*  K 3.4* 4.3 3.2*  CL 100* 96* 97*  CO2 29  --  30  GLUCOSE 96 132* 94  BUN 18 24* 17  CREATININE 0.81 0.70 0.75  CALCIUM 8.5*  --  8.2*    PT/INR: No results for input(s): LABPROT, INR in the last 72 hours. ABG    Component Value Date/Time   PHART 7.343 (L) 02/16/2017 1104   HCO3 24.1 02/16/2017 1104   TCO2 28 02/18/2017 1527   ACIDBASEDEF 2.0 02/16/2017 1104   O2SAT 55.8 02/19/2017 0430   CBG (last 3)   Recent Labs  02/18/17 2311 02/19/17 0027 02/19/17 0752  GLUCAP 69 122* 69    Assessment/Plan: S/P Procedure(s) (LRB): BENTALL AORTIC ROOT REPLACEMENT PROCEDURE, REPAIR ASCENDING AORTIC DISSECTION (N/A) TRANSESOPHAGEAL ECHOCARDIOGRAM (TEE) (N/A) CLIPPING OF ATRIAL APPENDAGE (Left) DRAINAGE OF PERICARDIAL FLUID CORONARY ARTERY BYPASS GRAFTING (CABG) x  one, using left saphenous vein (N/A) AORTIC VALVE REPLACEMENT (AVR) (N/A) place PICC remove neck sheath Needs PT lovenox low dose for afib  LOS: 7 days    Lydia Jordan 02/19/2017

## 2017-02-19 NOTE — Clinical Social Work Note (Signed)
CSW presented bed offers to patient and her daughter, they would like [facility].  CSW contacted [facility] who said they should be able to accept patient once she is medically ready for discharge and orders have been received.  CSW to update unit Education officer, museum.  Jones Broom. [place], MSW, [place]  02/19/2017 12:56 PM

## 2017-02-19 NOTE — Clinical Social Work Placement (Signed)
   CLINICAL SOCIAL WORK PLACEMENT  NOTE  Date:  02/19/2017  Patient Details  Name: Lydia Jordan MRN: [id number] Date of Birth: Feb 19, 1934  Clinical Social Work is seeking post-discharge placement for this patient at the [facility] level of care (*CSW will initial, date and re-position this form in  chart as items are completed):  Yes   Patient/family provided with [place] Work Department's list of facilities offering this level of care within the geographic area requested by the patient (or if unable, by the patient's family).  Yes   Patient/family informed of their freedom to choose among providers that offer the needed level of care, that participate in Medicare, Medicaid or managed care program needed by the patient, have an available bed and are willing to accept the patient.  Yes   Patient/family informed of [facility]'s ownership interest in [facility] and [facility], as well as of the fact that they are under no obligation to receive care at these facilities.  PASRR submitted to EDS on 02/19/17     PASRR number received on 02/19/17     Existing PASRR number confirmed on       FL2 transmitted to all facilities in geographic area requested by pt/family on 02/19/17     FL2 transmitted to all facilities within larger geographic area on       Patient informed that his/her managed care company has contracts with or will negotiate with certain facilities, including the following:        Yes   Patient/family informed of bed offers received.  Patient chooses bed at [facility]     Physician recommends and patient chooses bed at      Patient to be transferred to   on  .  Patient to be transferred to facility by       Patient family notified on   of transfer.  Name of family member notified:        PHYSICIAN Please sign FL2     Additional Comment:    _______________________________________________ Ross Ludwig,  LCSWA 02/19/2017, 12:57 PM

## 2017-02-19 NOTE — Progress Notes (Signed)
Modified Barium Swallow Progress Note  Patient Details  Name: Lydia Jordan MRN: [id number] Date of Birth: 1933/12/19  Today's Date: 02/19/2017  Modified Barium Swallow completed.  Full report located under Chart Review in the Imaging Section.  Brief recommendations include the following:  Clinical Impression  Pt presents with a functional oropharyngeal swallow, marked by high penetration of thin liquids occasionally, not considered disordered in the elderly.  There was adequate mastication; good pharyngeal clearance of POs; no aspiration.  Prominent cricopharyngeus was evident, and esophageal sweep revealed barium stasis filling the esophageal column and reaching cervical level.  Pt acknowledges hx of esophageal deficits.  For now, recommend changing diet to full liquids (thin liquids/straws permitted); pt should sit upright for meals and 45 minutes after.  She may benefit from repeat esophageal w/u when appropriate.  She is at risk for backflow/aspiration of esophageal material.  RN present; discussed results/recommendations.    Swallow Evaluation Recommendations   Recommended Consults: Consider esophageal assessment   SLP Diet Recommendations: Other (Comment) (full liquids)   Liquid Administration via: Straw   Medication Administration: Crushed with puree   Supervision: Patient able to self feed   Compensations: Follow solids with liquid   Postural Changes: Seated upright at 90 degrees   Oral Care Recommendations: Oral care BID        [name] 02/19/2017,1:13 PM

## 2017-02-19 NOTE — Progress Notes (Signed)
Peripherally Inserted Central Catheter/Midline Placement  The IV Nurse has discussed with the patient and/or persons authorized to consent for the patient, the purpose of this procedure and the potential benefits and risks involved with this procedure.  The benefits include less needle sticks, lab draws from the catheter, and the patient may be discharged home with the catheter. Risks include, but not limited to, infection, bleeding, blood clot (thrombus formation), and puncture of an artery; nerve damage and irregular heartbeat and possibility to perform a PICC exchange if needed/ordered by physician.  Alternatives to this procedure were also discussed.  Bard Power PICC patient education guide, fact sheet on infection prevention and patient information card has been provided to patient /or left at bedside.  Dtr signed consent 02-18-17, also at bedside tonight.  Pt and dtr agreeable to PICC at this time.  PICC/Midline Placement Documentation  PICC Double Lumen 02/19/17 PICC Right Brachial 35 cm 0 cm (Active)  Indication for Insertion or Continuance of Line Vasoactive infusions;Prolonged intravenous therapies;Poor Vasculature-patient has had multiple peripheral attempts or PIVs lasting less than 24 hours 02/19/2017  6:53 PM  Exposed Catheter (cm) 0 cm 02/19/2017  6:53 PM  Site Assessment Clean;Dry;Intact 02/19/2017  6:53 PM  Lumen #1 Status Flushed;Saline locked;Blood return noted 02/19/2017  6:53 PM  Lumen #2 Status Flushed;Saline locked;Blood return noted 02/19/2017  6:53 PM  Dressing Type Transparent 02/19/2017  6:53 PM  Dressing Status Clean;Dry;Intact;Antimicrobial disc in place 02/19/2017  6:53 PM  Line Care Connections checked and tightened 02/19/2017  6:53 PM  Line Adjustment (NICU/IV Team Only) No 02/19/2017  6:53 PM  Dressing Intervention New dressing 02/19/2017  6:53 PM  Dressing Change Due 02/26/17 02/19/2017  6:53 PM       Rolena Infante 02/19/2017, 6:54 PM

## 2017-02-19 NOTE — NC FL2 (Signed)
[place] LEVEL OF CARE SCREENING TOOL     IDENTIFICATION  Patient Name: Lydia Jordan Birthdate: December 02, 1933 Sex: female Admission Date (Current Location): 02/11/2017  [facility] and [place] Number:  Herbalist and Address:  [facility], [place] [address], [place], [place] [address]      Provider Number: [id number]  Attending Physician Name and Address:  [name], MD  Relative Name and Phone Number:  Beatriz Stallion Daughter [phone]      Current Level of Care: Hospital Recommended Level of Care: [place] Prior Approval Number:    Date Approved/Denied:   PASRR Number: [id number]  Discharge Plan: SNF    Current Diagnoses: Patient Active Problem List   Diagnosis Date Noted  . S/P ascending aortic dissection repair 02/15/2017  . 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 S/P CABG x 1 02/15/2017  . Pubic ramus fracture, right, closed, initial encounter ([place]) 02/12/2017  . Chronic pain   . Cervical disc disease   . Degenerative arthritis   . Physical deconditioning   . Fall   . Closed fracture of multiple pubic rami ([place])   . Anemia   . Hydronephrosis of right kidney   . Ascending aortic dissection ([place]) 02/11/2017  . Pericardial effusion 02/11/2017  . S/P cardiac cath: (2018) a. mild to moderate disease but no flow limiting lesions with perserved EF. b. large-caliber almost ectatic vessels for patients age. 01/12/2017  . Hypertension 01/12/2017  . CAD (coronary artery disease): mild to moderate non-obstructive 01/12/2017  . Epistaxis: (2018) cauterized by Dr. Erik Obey 67/34[birth year]1937  . Longstanding persistent atrial fibrillation ([place])   . Ascending aortic aneurysm ([place]) 01/11/2017  . NSTEMI (non-ST elevated myocardial infarction) ([place]) 01/10/2017    Orientation RESPIRATION BLADDER Height & Weight     Self, Situation, Place, Time  O2 Continent Weight: 144 lb 13.5 oz  (65.7 kg) Height:  5\' 3"  (160 cm)  BEHAVIORAL SYMPTOMS/MOOD NEUROLOGICAL BOWEL NUTRITION STATUS      Continent Diet (Dysphagia 1 diet)  AMBULATORY STATUS COMMUNICATION OF NEEDS Skin   Limited Assist Verbally Surgical wounds                       Personal Care Assistance Level of Assistance  Bathing, Feeding, Dressing Bathing Assistance: Limited assistance Feeding assistance: Independent Dressing Assistance: Limited assistance     Functional Limitations Info  Sight, Speech, Hearing Sight Info: Adequate Hearing Info: Adequate Speech Info: Adequate    SPECIAL CARE FACTORS FREQUENCY  PT (By licensed PT), OT (By licensed OT)     PT Frequency: 5x a week OT Frequency: 5x a week            Contractures Contractures Info: Not present    Additional Factors Info  Code Status, Allergies, Insulin Sliding Scale Code Status Info: Full Allergies Info: DEMEROL MEPERIDINE    Insulin Sliding Scale Info: insulin aspart (novoLOG) injection 0-9 Units 3x a day with meals.       Current Medications (02/19/2017):  This is the current hospital active medication list Current Facility-Administered Medications  Medication Dose Route Frequency Provider Last Rate Last Dose  . 0.9 %  sodium chloride infusion  250 mL Intravenous Continuous [name], MD 1 mL/hr at 02/19/17 0800 250 mL at 02/19/17 0800  . 0.9 %  sodium chloride infusion   Intravenous Once [name], MD      . acetaminophen (TYLENOL) tablet  1,000 mg  1,000 mg Oral Q6H [name], MD   1,000 mg at 02/18/17 2250  . aspirin EC tablet 325 mg  325 mg Oral Daily [name], MD   325 mg at 02/19/17 1007  . bisacodyl (DULCOLAX) EC tablet 10 mg  10 mg Oral Daily [name], MD   10 mg at 02/19/17 1007   Or  . bisacodyl (DULCOLAX) suppository 10 mg  10 mg Rectal Daily [name], MD      . carvedilol (COREG) tablet 3.125 mg  3.125 mg Oral BID WC Prescott Gum, Collier Salina, MD   3.125 mg at 02/19/17 1009  .  Chlorhexidine Gluconate Cloth 2 % PADS 6 each  6 each Topical Q0600 [name], MD   6 each at 02/19/17 0600  . digoxin (LANOXIN) tablet 0.125 mg  0.125 mg Oral q morning - 10a [name], MD   0.125 mg at 02/19/17 1007  . docusate sodium (COLACE) capsule 200 mg  200 mg Oral Daily [name], MD   200 mg at 02/19/17 1006  . DULoxetine (CYMBALTA) DR capsule 30 mg  30 mg Oral QHS [name], MD   30 mg at 02/18/17 2246  . DULoxetine (CYMBALTA) DR capsule 60 mg  60 mg Oral Daily [name], MD   60 mg at 02/19/17 1006  . enoxaparin (LOVENOX) injection 30 mg  30 mg Subcutaneous Q24H Prescott Gum, Collier Salina, MD   30 mg at 02/18/17 2242  . feeding supplement (ENSURE ENLIVE) (ENSURE ENLIVE) liquid 237 mL  237 mL Oral BID BM Prescott Gum, Collier Salina, MD      . feeding supplement (ENSURE ENLIVE) (ENSURE ENLIVE) liquid 237 mL  237 mL Oral TID WC Prescott Gum, Collier Salina, MD      . furosemide (LASIX) injection 20 mg  20 mg Intravenous BID Prescott Gum, Collier Salina, MD      . insulin aspart (novoLOG) injection 0-5 Units  0-5 Units Subcutaneous QHS Prescott Gum, Collier Salina, MD      . insulin aspart (novoLOG) injection 0-9 Units  0-9 Units Subcutaneous TID WC Prescott Gum, Collier Salina, MD      . levothyroxine (SYNTHROID, LEVOTHROID) tablet 100 mcg  100 mcg Oral QAC breakfast [name], MD   100 mcg at 02/19/17 8101  . MEDLINE mouth rinse  15 mL Mouth Rinse BID [name], MD   15 mL at 02/19/17 1008  . metoprolol tartrate (LOPRESSOR) injection 2.5-5 mg  2.5-5 mg Intravenous Q2H PRN [name], MD   5 mg at 02/19/17 0817  . milrinone (PRIMACOR) 20 MG/100 ML (0.2 mg/mL) infusion  0.125 mcg/kg/min Intravenous Continuous Prescott Gum, Collier Salina, MD 2.3 mL/hr at 02/19/17 0900 0.125 mcg/kg/min at 02/19/17 0900  . morphine 4 MG/ML injection 1-2 mg  1-2 mg Intravenous Q1H PRN [name], MD   2 mg at 02/16/17 2327  . ondansetron (ZOFRAN) injection 4 mg  4 mg Intravenous Q6H PRN [name], MD      . pantoprazole  (PROTONIX) EC tablet 80 mg  80 mg Oral Daily [name], MD   80 mg at 02/19/17 1007  . potassium chloride SA (K-DUR,KLOR-CON) CR tablet 20 mEq  20 mEq Oral Daily Prescott Gum, Collier Salina, MD   20 mEq at 02/19/17 1007  . [place]   Oral PRN Prescott Gum, Collier Salina, MD      . sodium chloride flush (NS) 0.9 % injection 3 mL  3 mL Intravenous Q12H [name], MD   3 mL at 02/18/17 2243  . sodium chloride flush (NS) 0.9 % injection 3 mL  3 mL Intravenous PRN [name], MD      . traMADol Veatrice Bourbon) tablet 50-100 mg  50-100 mg Oral Q4H PRN [name], MD   50 mg at 02/19/17 1517     Discharge Medications: Please see discharge summary for a list of discharge medications.  Relevant Imaging Results:  Relevant Lab Results:   Additional Information SSN: [id number]  Anell Barr

## 2017-02-20 ENCOUNTER — Inpatient Hospital Stay (HOSPITAL_COMMUNITY): Payer: Medicare Other

## 2017-02-20 ENCOUNTER — Encounter (HOSPITAL_COMMUNITY): Payer: Self-pay | Admitting: Thoracic Surgery (Cardiothoracic Vascular Surgery)

## 2017-02-20 DIAGNOSIS — Z952 Presence of prosthetic heart valve: Secondary | ICD-10-CM

## 2017-02-20 DIAGNOSIS — I214 Non-ST elevation (NSTEMI) myocardial infarction: Secondary | ICD-10-CM

## 2017-02-20 DIAGNOSIS — I5032 Chronic diastolic (congestive) heart failure: Secondary | ICD-10-CM | POA: Diagnosis present

## 2017-02-20 DIAGNOSIS — Z953 Presence of xenogenic heart valve: Secondary | ICD-10-CM

## 2017-02-20 DIAGNOSIS — E876 Hypokalemia: Secondary | ICD-10-CM

## 2017-02-20 DIAGNOSIS — S32591A Other specified fracture of right pubis, initial encounter for closed fracture: Secondary | ICD-10-CM

## 2017-02-20 DIAGNOSIS — R0682 Tachypnea, not elsewhere classified: Secondary | ICD-10-CM

## 2017-02-20 DIAGNOSIS — R5381 Other malaise: Secondary | ICD-10-CM

## 2017-02-20 DIAGNOSIS — I5033 Acute on chronic diastolic (congestive) heart failure: Secondary | ICD-10-CM

## 2017-02-20 DIAGNOSIS — R1310 Dysphagia, unspecified: Secondary | ICD-10-CM

## 2017-02-20 DIAGNOSIS — D72829 Elevated white blood cell count, unspecified: Secondary | ICD-10-CM

## 2017-02-20 DIAGNOSIS — G894 Chronic pain syndrome: Secondary | ICD-10-CM

## 2017-02-20 LAB — CBC
HCT: 31.1 % — ABNORMAL LOW (ref 36.0–46.0)
Hemoglobin: 10 g/dL — ABNORMAL LOW (ref 12.0–15.0)
MCH: 28.9 pg (ref 26.0–34.0)
MCHC: 32.2 g/dL (ref 30.0–36.0)
MCV: 89.9 fL (ref 78.0–100.0)
Platelets: 232 10*3/uL (ref 150–400)
RBC: 3.46 MIL/uL — ABNORMAL LOW (ref 3.87–5.11)
RDW: 14.4 % (ref 11.5–15.5)
WBC: 14.8 10*3/uL — ABNORMAL HIGH (ref 4.0–10.5)

## 2017-02-20 LAB — BASIC METABOLIC PANEL
Anion gap: 7 (ref 5–15)
BUN: 14 mg/dL (ref 6–20)
CO2: 30 mmol/L (ref 22–32)
Calcium: 7.7 mg/dL — ABNORMAL LOW (ref 8.9–10.3)
Chloride: 95 mmol/L — ABNORMAL LOW (ref 101–111)
Creatinine, Ser: 0.6 mg/dL (ref 0.44–1.00)
GFR calc Af Amer: >60 mL/min (ref >60)
GFR calc non Af Amer: >60 mL/min (ref >60)
Glucose, Bld: 102 mg/dL — ABNORMAL HIGH (ref 65–99)
Potassium: 3.3 mmol/L — ABNORMAL LOW (ref 3.5–5.1)
Sodium: 132 mmol/L — ABNORMAL LOW (ref 135–145)

## 2017-02-20 LAB — COOXEMETRY PANEL
CARBOXYHEMOGLOBIN: 1.6 % — AB (ref 0.5–1.5)
Methemoglobin: 1.2 % (ref 0.0–1.5)
O2 Saturation: 59.1 %
Total hemoglobin: 10.1 g/dL — ABNORMAL LOW (ref 12.0–16.0)

## 2017-02-20 LAB — GLUCOSE, CAPILLARY
Glucose-Capillary: 81 mg/dL (ref 65–99)
Glucose-Capillary: 142 mg/dL — ABNORMAL HIGH (ref 65–99)

## 2017-02-20 MED ORDER — MOVING RIGHT ALONG BOOK
Freq: Once | Status: AC
  Administered 2017-02-20: 10:00:00
  Filled 2017-02-20: qty 1

## 2017-02-20 MED ORDER — ASPIRIN EC 81 MG PO TBEC
81.0000 mg | DELAYED_RELEASE_TABLET | Freq: Every day | ORAL | Status: DC
  Administered 2017-02-20 – 2017-02-21 (×2): 81 mg via ORAL
  Filled 2017-02-20 (×2): qty 1

## 2017-02-20 MED ORDER — FA-PYRIDOXINE-CYANOCOBALAMIN 2.5-25-2 MG PO TABS
1.0000 | ORAL_TABLET | Freq: Every day | ORAL | Status: DC
  Administered 2017-02-20 – 2017-02-21 (×2): 1 via ORAL
  Filled 2017-02-20 (×2): qty 1

## 2017-02-20 MED ORDER — SODIUM CHLORIDE 0.9% FLUSH: 3.0000 mL | INTRAVENOUS | Status: DC | PRN

## 2017-02-20 MED ORDER — SODIUM CHLORIDE 0.9 % IV SOLN: 250.0000 mL | INTRAVENOUS | Status: DC | PRN

## 2017-02-20 MED ORDER — POTASSIUM CHLORIDE 10 MEQ/50ML IV SOLN
10.0000 meq | INTRAVENOUS | Status: AC
  Administered 2017-02-20 (×3): 10 meq via INTRAVENOUS
  Filled 2017-02-20: qty 50

## 2017-02-20 MED ORDER — ATORVASTATIN CALCIUM 20 MG PO TABS
20.0000 mg | ORAL_TABLET | Freq: Every day | ORAL | Status: DC
  Administered 2017-02-20 – 2017-02-21 (×2): 20 mg via ORAL
  Filled 2017-02-20 (×2): qty 1

## 2017-02-20 MED ORDER — POLYSACCHARIDE IRON COMPLEX 150 MG PO CAPS
150.0000 mg | ORAL_CAPSULE | Freq: Every day | ORAL | Status: DC
  Administered 2017-02-20 – 2017-02-21 (×2): 150 mg via ORAL
  Filled 2017-02-20 (×2): qty 1

## 2017-02-20 MED ORDER — POTASSIUM CHLORIDE 10 MEQ/50ML IV SOLN
10.0000 meq | INTRAVENOUS | Status: AC | PRN
  Administered 2017-02-20 (×3): 10 meq via INTRAVENOUS
  Filled 2017-02-20 (×6): qty 50

## 2017-02-20 MED ORDER — POTASSIUM CHLORIDE 20 MEQ PO PACK
40.0000 meq | PACK | Freq: Two times a day (BID) | ORAL | Status: DC
  Administered 2017-02-20 – 2017-02-21 (×2): 40 meq via ORAL
  Filled 2017-02-20 (×3): qty 2

## 2017-02-20 MED ORDER — SODIUM CHLORIDE 0.9% FLUSH
3.0000 mL | Freq: Two times a day (BID) | INTRAVENOUS | Status: DC
  Administered 2017-02-20 (×2): 3 mL via INTRAVENOUS

## 2017-02-20 MED ORDER — POTASSIUM CHLORIDE 10 MEQ/50ML IV SOLN
INTRAVENOUS | Status: AC
  Administered 2017-02-20: 10 meq via INTRAVENOUS
  Filled 2017-02-20: qty 50

## 2017-02-20 MED FILL — Gelatin Absorbable MT Powder: OROMUCOSAL | Qty: 1 | Status: AC

## 2017-02-20 NOTE — Progress Notes (Signed)
PT Cancellation Note  Patient Details Name: Lydia Jordan MRN: [id number] DOB: 26-Oct-1933   Cancelled Treatment:    Reason Eval/Treat Not Completed: Other (comment). Awaiting removal of pacer wires and then patient on bed rest for 1 hour after. PT to return as able once cleared for OOB mobility.   Lydia Jordan 02/20/2017, 9:00 AM   Lydia Jordan, PT, DPT Pager #: [phone] Office #: [phone]

## 2017-02-20 NOTE — Consult Note (Signed)
Physical Medicine and Rehabilitation Consult   Reason for Consult: Debility due to recent surgery and pelvic fracture Referring Physician: Dr. Roxy Manns.    HPI: Lydia Jordan is a 83 y.o. female with history of A fib, recent NSTEMI, achalasia, DJD/DDD with chronic pain who sustained a fall 02/08/17 with pain in right hip and decline in mobility.  Evaluation in ED 5/12 revealed right pubic rami fracture, chronic right UPJ obstruction with moderate to severe hydronephrosis as well as type A aortic dissection. She was evaluated by Dr. Roxy Manns and was cleared for repair of aneurysm on 02/15/17. to be WBAT per Dr. Fredonia Highland. Post op on milironone for fluid overload and on low dose Lovenox for A fib. She has had reports of dysphagia and MBS done functional swallow with high penetration of thin liquids occasionally and modified diet recommended by ST. CXR reviewed 5/21, stable changes. Therapy ongoing and patient limited by SOB and fatigue. Patient with significant deficits in mobility and ability to carry out ADL tasks. CIR recommended for follow up therapy. Independent PTA--has started using AD recently due to multiple falls. Sister and daughter supportive and can assist as needed after discharge.    Review of Systems  HENT: Positive for hearing loss.   Eyes: Negative for blurred vision and double vision.  Respiratory: Positive for cough, shortness of breath and wheezing.   Cardiovascular: Positive for chest pain.  Gastrointestinal: Positive for diarrhea. Negative for heartburn and nausea.  Genitourinary: Negative for dysuria and urgency.  Musculoskeletal: Positive for back pain, falls (2-3 falls recently), myalgias and neck pain.  Skin: Positive for rash.  Neurological: Positive for dizziness and weakness. Negative for speech change, focal weakness and headaches.  Psychiatric/Behavioral: Positive for memory loss. Negative for depression. The patient does not have insomnia.   All other systems  reviewed and are negative.     Past Medical History:  Diagnosis Date  . Acid reflux   . Arthritis   . Ascending aortic aneurysm ([place]) 01/11/2017  . Ascending aortic dissection ([place]) 02/11/2017  . CAD (coronary artery disease)    Cath 01/11/17 showed 50% ost RCA and 50% mLAD --> medical therapy   . Cervical disc disease   . Chronic diastolic congestive heart failure ([place])   . Chronic pain    Secondary to degenerative disc disease and arthritis  . Degenerative arthritis   . Hydronephrosis of right kidney   . Hypertension   . Hypothyroidism   . Hypothyroidism   . Longstanding persistent atrial fibrillation ([place])    originally diagnosed 2007  . NSTEMI (non-ST elevated myocardial infarction) ([place]) 01/10/2017  . Pubic ramus fracture, right, closed, initial encounter ([place]) 02/12/2017  . S/P ascending aortic dissection repair 02/15/2017   Straight graft replacement of sub-acute ascending aortic dissection with hemi-arch distal aortic reconstruction  . 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 AORTIC VALVE REPLACEMENT N/A 02/15/2017   Procedure: AORTIC VALVE REPLACEMENT (AVR);  Surgeon: [name], MD;  Location: [place];  Service: Open Heart Surgery;  Laterality: N/A;  . BENTALL PROCEDURE N/A 02/15/2017   Procedure: BENTALL AORTIC ROOT REPLACEMENT PROCEDURE, REPAIR ASCENDING AORTIC DISSECTION;  Surgeon: [name], MD;  Location: [place];  Service:  Open Heart Surgery;  Laterality: N/A;  . CLIPPING OF ATRIAL APPENDAGE Left 02/15/2017   Procedure: CLIPPING OF ATRIAL APPENDAGE;  Surgeon: [name], MD;  Location: [place];  Service: Open Heart Surgery;  Laterality: Left;  . CORONARY ARTERY BYPASS  GRAFT N/A 02/15/2017   Procedure: CORONARY ARTERY BYPASS GRAFTING (CABG) x  one, using left saphenous vein;  Surgeon: [name], MD;  Location: [facility] OR;  Service: Vascular;  Laterality: N/A;  . ESOPHAGEAL MANOMETRY N/A 07/31/2015   Procedure: ESOPHAGEAL MANOMETRY (EM);  Surgeon: Arta Silence, MD;  Location: [facility];  Service: Endoscopy;  Laterality: N/A;  . ESOPHAGOGASTRODUODENOSCOPY (EGD) WITH PROPOFOL N/A 09/02/2015   Procedure: ESOPHAGOGASTRODUODENOSCOPY (EGD) WITH PROPOFOL;  Surgeon: Arta Silence, MD;  [facility];  Service: Endoscopy;  Laterality: N/A;  botulinum toxin injection (100 Units; 25 units in 4 quadrants 1-2 cm proximal to GE junction  . LEFT HEART CATH AND CORONARY ANGIOGRAPHY N/A 01/11/2017   Procedure: Left Heart Cath and Coronary Angiography;  Surgeon: Leonie Man, MD;  Location: [place] [facility];  Service: Cardiovascular;  Laterality: N/A;  . PERICARDIAL FLUID DRAINAGE  02/15/2017   Procedure: DRAINAGE OF PERICARDIAL FLUID;  Surgeon: [name], MD;  Location: [place];  Service: Vascular;;  . skin cancer area removed      right leg healing well  . TEE WITHOUT CARDIOVERSION N/A 02/15/2017   Procedure: TRANSESOPHAGEAL ECHOCARDIOGRAM (TEE);  Surgeon: [name], MD;  Location: [place];  Service: Open Heart Surgery;  Laterality: N/A;  . VAGINAL HYSTERECTOMY      Family History  Problem Relation Age of Onset  . Hypertension Mother   . Anuerysm Mother        died in her 41's  . Arthritis Father        died at age 61  . Heart attack Brother        died in 11's    Social History:  Divorced.  Lives in an in-law apartment next to daughter. Retired. Independent but limited by severe arthritis. She  reports that she has quit smoking. She has never used smokeless tobacco. She reports that she does not drink alcohol or use drugs.    Allergies  Allergen Reactions  . Demerol [Meperidine] Anaphylaxis    Reaction: "Heart stopped"   Medications  Prior to Admission  Medication Sig Dispense Refill  . apixaban (ELIQUIS) 5 MG TABS tablet Take 1 tablet (5 mg total) by mouth 2 (two) times daily.    Marland Kitchen atorvastatin (LIPITOR) 20 MG tablet Take 1 tablet (20 mg total) by mouth daily. 30 tablet 6  . diclofenac (VOLTAREN) 50 MG EC tablet Take 1 tablet (50 mg total) by mouth 2 (two) times daily. 180 tablet 3  . digoxin (LANOXIN) 0.125 MG tablet Take 0.125 mg by mouth every morning.    . DULoxetine (CYMBALTA) 30 MG capsule Take 30 mg by mouth at bedtime.     . DULoxetine (CYMBALTA) 60 MG capsule Take 60 mg by mouth daily.  1  . HYDROcodone-acetaminophen (NORCO/VICODIN) 5-325 MG tablet Take 1 tablet by mouth 2 (two) times daily. Pain    . Liniments (SALONPAS PAIN RELIEF PATCH EX) Apply 1 patch topically daily as needed. For muscle pain    . nebivolol (BYSTOLIC) 5 MG tablet Take 5 mg by mouth every morning.    . nitroGLYCERIN (NITROSTAT) 0.4 MG SL tablet Place 1 tablet (0.4 mg total) under the tongue every 5 (five) minutes x 3 doses as  needed for chest pain. 12 tablet 2  . omeprazole (PRILOSEC) 40 MG capsule Take 40 mg by mouth at bedtime.    Marland Kitchen SYNTHROID 100 MCG tablet Take 100 mcg by mouth daily before breakfast.  11    Home: Home Living Family/patient expects to be discharged to:: Private residence Living Arrangements: Children Available Help at Discharge: Family, Available 24 hours/day (daughter owns her own business) Type of Home: House Home Access: Level entry Home Layout: One level Bathroom Shower/Tub: Multimedia programmer: Handicapped height Bathroom Accessibility: Yes Home Equipment: Environmental consultant - 4 wheels, Shower seat, Hand held shower head, Shower seat - built in, [employer] - tub/shower  Functional History: Prior Function Level of Independence: Independent Functional Status:  Mobility: Bed Mobility Overal bed mobility: Needs Assistance Bed Mobility: Supine to Sit Supine to sit: HOB elevated, Min assist Sit to supine: Mod  assist General bed mobility comments: minA for trunk elevation and to bring hips to EOB Transfers Overall transfer level: Needs assistance Equipment used: 2 person hand held assist Transfers: Sit to/from Stand Sit to Stand: Min assist, +2 physical assistance Stand pivot transfers: Mod assist, +2 physical assistance, +2 safety/equipment General transfer comment: minA to power up due to inability to use UEs to push up, minA to steady pt due to R LE soreness Ambulation/Gait Ambulation/Gait assistance: Min assist, +2 safety/equipment Ambulation Distance (Feet): 25 Feet Assistive device: Rolling walker (2 wheeled) Gait Pattern/deviations: Step-to pattern, Decreased stride length, Decreased step length - left, Decreased stance time - right General Gait Details: v/c's to minimize bilat UE Wbing due to sternal precautions. pt with + SOB, pt on 2Lo2 via [place], unable to get SpO2 ready until 5 min after amb which was 95% pt able to tolerate increased R LE WBing Gait velocity: slow Gait velocity interpretation: Below normal speed for age/gender    ADL:    Cognition: Cognition Overall Cognitive Status: Within Functional Limits for tasks assessed Orientation Level: Oriented X4 Cognition Arousal/Alertness: Awake/alert Behavior During Therapy: WFL for tasks assessed/performed Overall Cognitive Status: Within Functional Limits for tasks assessed Area of Impairment: Memory, Following commands, Safety/judgement, Problem solving Memory: Decreased short-term memory, Decreased recall of precautions Following Commands: Follows one step commands with increased time, Follows one step commands inconsistently Safety/Judgement: Decreased awareness of safety, Decreased awareness of deficits Problem Solving: Slow processing, Decreased initiation, Difficulty sequencing, Requires verbal cues, Requires tactile cues General Comments: pt with good spirits and able to follow commands consistently  Blood pressure  117/73, pulse (!) 53, temperature 97.8 F (36.6 C), temperature source Oral, resp. rate (!) 23, height 5' 3"  (1.6 m), weight 66.8 kg (147 lb 4.3 oz), SpO2 (!) 78 %. Physical Exam  Nursing note and vitals reviewed. Constitutional: She appears well-developed and well-nourished.  Right forehead and periorbital ecchymosis resolving.  HENT:  Head: Normocephalic and atraumatic.  Eyes: Conjunctivae and EOM are normal. Pupils are equal, round, and reactive to light.  Neck: Decreased range of motion present.  Decreased ROM due to DDD.   Cardiovascular: Normal rate and regular rhythm.   Respiratory: Effort normal. No accessory muscle usage or stridor. No respiratory distress. She has decreased breath sounds. She has no wheezes. She exhibits no tenderness.  Audible upper airway wheezing with frequent dry cough. Midline incision clean, dry and intact.   GI: Soft. Bowel sounds are normal. She exhibits no distension. There is no tenderness.  Musculoskeletal: She exhibits edema.  Drain left thigh. Moves BLE without discomfort.   Neurological: She is alert.  Speech  clear.  Mild disorientation --question due to decreased hearing. Able to follow simple motor commands without difficulty. Lacks insight/awareness of deficits.  Motor: 4/5 grossly throughout.    Skin: Skin is warm and dry.  Psychiatric: She has a normal mood and affect. Her behavior is normal. Thought content normal.    Results for orders placed or performed during the hospital encounter of 02/11/17 (from the past 24 hour(s))  I-STAT, chem 8     Status: Abnormal   Collection Time: 02/19/17  3:59 PM  Result Value Ref Range   Sodium 134 (L) 135 - 145 mmol/L   Potassium 3.4 (L) 3.5 - 5.1 mmol/L   Chloride 92 (L) 101 - 111 mmol/L   BUN 19 6 - 20 mg/dL   Creatinine, Ser 0.70 0.44 - 1.00 mg/dL   Glucose, Bld 118 (H) 65 - 99 mg/dL   Calcium, Ion 1.13 (L) 1.15 - 1.40 mmol/L   TCO2 30 0 - 100 mmol/L   Hemoglobin 10.2 (L) 12.0 - 15.0 g/dL    HCT 30.0 (L) 36.0 - 46.0 %  Glucose, capillary     Status: None   Collection Time: 02/19/17  4:26 PM  Result Value Ref Range   Glucose-Capillary 99 65 - 99 mg/dL   Comment 1 Capillary Specimen    Comment 2 Notify RN   Glucose, capillary     Status: Abnormal   Collection Time: 02/19/17  9:20 PM  Result Value Ref Range   Glucose-Capillary 101 (H) 65 - 99 mg/dL   Comment 1 Capillary Specimen    Comment 2 Notify RN    Comment 3 Document in Chart   .Cooxemetry Panel (carboxy, met, total hgb, O2 sat)     Status: Abnormal   Collection Time: 02/20/17  4:35 AM  Result Value Ref Range   Total hemoglobin 10.1 (L) 12.0 - 16.0 g/dL   O2 Saturation 59.1 %   Carboxyhemoglobin 1.6 (H) 0.5 - 1.5 %   Methemoglobin 1.2 0.0 - 1.5 %  CBC     Status: Abnormal   Collection Time: 02/20/17  4:40 AM  Result Value Ref Range   WBC 14.8 (H) 4.0 - 10.5 K/uL   RBC 3.46 (L) 3.87 - 5.11 MIL/uL   Hemoglobin 10.0 (L) 12.0 - 15.0 g/dL   HCT 31.1 (L) 36.0 - 46.0 %   MCV 89.9 78.0 - 100.0 fL   MCH 28.9 26.0 - 34.0 pg   MCHC 32.2 30.0 - 36.0 g/dL   RDW 14.4 11.5 - 15.5 %   Platelets 232 150 - 400 K/uL  Basic metabolic panel     Status: Abnormal   Collection Time: 02/20/17  4:40 AM  Result Value Ref Range   Sodium 132 (L) 135 - 145 mmol/L   Potassium 3.3 (L) 3.5 - 5.1 mmol/L   Chloride 95 (L) 101 - 111 mmol/L   CO2 30 22 - 32 mmol/L   Glucose, Bld 102 (H) 65 - 99 mg/dL   BUN 14 6 - 20 mg/dL   Creatinine, Ser 0.60 0.44 - 1.00 mg/dL   Calcium 7.7 (L) 8.9 - 10.3 mg/dL   GFR calc non Af Amer >60 >60 mL/min   GFR calc Af Amer >60 >60 mL/min   Anion gap 7 5 - 15  Glucose, capillary     Status: None   Collection Time: 02/20/17  8:33 AM  Result Value Ref Range   Glucose-Capillary 81 65 - 99 mg/dL   Comment 1 Notify RN  Dg Chest Port 1 View  Result Date: 02/20/2017 CLINICAL DATA:  83 year old female post aortic valve replacement. Subsequent encounter. EXAM: PORTABLE CHEST 1 VIEW COMPARISON:  02/19/2017.  FINDINGS: Right PICC line tip projects at the level of the distal superior vena cava/ cavoatrial junction. Right internal jugular catheter has been removed. Post aortic valve replacement and left atrial appendage clipping. Cardiomegaly. Central pulmonary vascular congestion. Perihilar/basilar subsegmental atelectasis. There may be small pleural effusions. No pneumothorax. Calcified tortuous aorta. Bilateral prominent shoulder joint degenerative changes. IMPRESSION: Right internal jugular catheter has been removed. Right PICC catheter tip distal superior vena cava/ cavoatrial junction level. Cardiomegaly post aortic valve replacement and left atrial appendage clipping. Central pulmonary vascular prominence. Subsegmental atelectasis perihilar region lung bases. There may be small bilateral pleural effusions. Electronically Signed   By: Genia Del M.D.   On: 02/20/2017 07:36   Dg Chest Port 1 View  Result Date: 02/19/2017 CLINICAL DATA:  Central line placement.  Initial encounter. EXAM: PORTABLE CHEST 1 VIEW COMPARISON:  Chest radiograph performed earlier today at 4:44 a.m. FINDINGS: The patient's right PICC is noted ending about the mid SVC. A right IJ line is noted ending about the proximal SVC. A small left pleural effusion is suspected. No pneumothorax is seen. There is trace fluid tracking along the right minor fissure. The cardiomediastinal silhouette is mildly enlarged. The patient is status post median sternotomy. An aortic valve replacement is noted. No acute osseous abnormalities are identified. Mild degenerative change is noted at the glenohumeral joints bilaterally, with chronic superior subluxation of the humeral heads. IMPRESSION: 1. Right PICC noted ending about the mid SVC. 2. Right IJ line noted ending about the proximal SVC. 3. Suspect small left pleural effusion. Trace fluid tracking along the right minor fissure. Mild cardiomegaly. Electronically Signed   By: Garald Balding M.D.   On:  02/19/2017 20:23   Dg Chest Port 1 View  Result Date: 02/19/2017 CLINICAL DATA:  Status post aortic valve replacement. EXAM: PORTABLE CHEST 1 VIEW COMPARISON:  02/18/2017 FINDINGS: Cyst the previous day's study, the right and left-sided chest tubes have been removed. No convincing pneumothorax. Mild atelectasis extends laterally from the right hilum, improved from the previous day's study. Left mid lung and right lower lung atelectasis noted on the prior exam has mostly resolved. No new lung abnormalities. No pulmonary edema. There is no mediastinal widening. The mediastinal tubes and right internal jugular introducer sheath are stable. IMPRESSION: 1. Status post removal of the chest tubes. No convincing pneumothorax. 2. No acute finding or evidence of an operative complication. No mediastinal widening or pulmonary edema. Electronically Signed   By: Lajean Manes M.D.   On: 02/19/2017 07:17   Dg Swallowing Func-speech Pathology  Result Date: 02/19/2017 Objective Swallowing Evaluation: Type of Study: MBS-Modified Barium Swallow Study Patient Details Name: LIANNE CARRETO MRN: [id number] Date of Birth: 31-May-1934 Today's Date: 02/19/2017 Time: SLP Start Time (ACUTE ONLY): 1156-SLP Stop Time (ACUTE ONLY): 1220 SLP Time Calculation (min) (ACUTE ONLY): 24 min Past Medical History: Past Medical History: Diagnosis Date . Acid reflux  . Arthritis  . Ascending aortic aneurysm ([place]) 01/11/2017 . Ascending aortic dissection ([place]) 02/11/2017 . CAD (coronary artery disease)   Cath 01/11/17 showed 50% ost RCA and 50% mLAD --> medical therapy  . Cervical disc disease  . Chronic pain   Secondary to degenerative disc disease and arthritis . Degenerative arthritis  . Hydronephrosis of right kidney  . Hypertension  . Hypothyroidism  . Hypothyroidism  . Longstanding  persistent atrial fibrillation [facility])   originally diagnosed 2007 . NSTEMI (non-ST elevated myocardial infarction) ([place]) 01/10/2017 . Pubic ramus fracture, right,  closed, initial encounter ([place]) 02/12/2017 . S/P ascending aortic dissection repair 02/15/2017  Straight graft replacement of sub-acute ascending aortic dissection with hemi-arch distal aortic reconstruction . 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 AORTIC VALVE REPLACEMENT N/A 02/15/2017  Procedure: AORTIC VALVE REPLACEMENT (AVR);  Surgeon: [name], MD;  Location: [place];  Service: Open Heart Surgery;  Laterality: N/A; . BENTALL PROCEDURE N/A 02/15/2017  Procedure: BENTALL AORTIC ROOT REPLACEMENT PROCEDURE, REPAIR ASCENDING AORTIC DISSECTION;  Surgeon: [name], MD;  Location: [place];  Service: Open Heart Surgery;  Laterality: N/A; . CLIPPING OF ATRIAL APPENDAGE Left 02/15/2017  Procedure: CLIPPING OF ATRIAL APPENDAGE;  Surgeon: [name], MD;  Location: [place];  Service: Open Heart Surgery;  Laterality: Left; . CORONARY ARTERY BYPASS GRAFT N/A 02/15/2017  Procedure: CORONARY ARTERY BYPASS GRAFTING (CABG) x  one, using left saphenous vein;  Surgeon: [name], MD;  [facility];  Service: Vascular;  Laterality: N/A; . ESOPHAGEAL MANOMETRY N/A 07/31/2015  Procedure: ESOPHAGEAL MANOMETRY (EM);  Surgeon: Arta Silence, MD;  [facility];  Service: Endoscopy;  Laterality: N/A; . ESOPHAGOGASTRODUODENOSCOPY (EGD) WITH PROPOFOL N/A 09/02/2015  Procedure: ESOPHAGOGASTRODUODENOSCOPY (EGD) WITH PROPOFOL;  Surgeon: Arta Silence, MD;  [facility];  Service: Endoscopy;  Laterality: N/A;  botulinum toxin injection (100 Units; 25 units in 4 quadrants 1-2 cm proximal to GE junction . LEFT HEART CATH AND CORONARY ANGIOGRAPHY N/A 01/11/2017  Procedure: Left Heart Cath and Coronary  Angiography;  Surgeon: Leonie Man, MD;  Location: [place] [facility];  Service: Cardiovascular;  Laterality: N/A; . PERICARDIAL FLUID DRAINAGE  02/15/2017  Procedure: DRAINAGE OF PERICARDIAL FLUID;  Surgeon: [name], MD;  Location: [place];  Service: Vascular;; . skin cancer area removed     right leg healing well . TEE WITHOUT CARDIOVERSION N/A 02/15/2017  Procedure: TRANSESOPHAGEAL ECHOCARDIOGRAM (TEE);  Surgeon: [name], MD;  Location: [place];  Service: Open Heart Surgery;  Laterality: N/A; . VAGINAL HYSTERECTOMY   HPI: Pt is an 83 y.o. female with PMH of atrial fibrillation on eliquis, HTN, hypothyroidism, acid reflux, recent NSTEMI, aortic dissection. Pt had a mechanical fall on 5/9, during which time she bruised her face and hip. She had pain after the fall and felt she needed to be seen, therefore presenting 5/12. Given that she is on anticoagulation, she underwent CT of head and pelvis. Initial CT concerning for unclear mass vs. Consolidation at left lung apex, so repeat CT with contrast performed. CT showed type A dissection. Underwent CT surgery on 5/16 and was extubated 5/17. CXR 5/19 showed mild bilateral atelectasis. Bedside swallow eval ordered due to coughing observed with oral intake per MD. Pt currently on dysphagia 1 diet/ nectar thick liquids. barium swallow 06/2015: "Significant tertiary contractions in the mid and distal esophagus; short segment stricture of the distal esophagus just above the small hiatal hernia. The barium pill lodges at the level of the short segment distal esophageal stricture; prominent cricopharyngeus muscle. " Pt reports botox tx to esophagus.   Subjective: alert, communicative Assessment / Plan / Recommendation CHL IP CLINICAL IMPRESSIONS  02/19/2017 Clinical Impression Pt presents with a functional oropharyngeal swallow, marked by high penetration of thin liquids occasionally, not considered disordered in the elderly.  There was adequate mastication;  good pharyngeal clearance of POs; no aspiration.  Prominent cricopharyngeus was evident, and esophageal sweep revealed barium stasis filling the esophageal column and reaching cervical level.  Pt acknowledges hx of esophageal deficits.  For now, recommend changing diet to full liquids (thin liquids/straws permitted); pt should sit upright for meals and 45 minutes after.  She may benefit from repeat esophageal w/u when appropriate.  She is at risk for backflow/aspiration of esophageal material.  RN present; discussed results/recommendations.  SLP Visit Diagnosis Dysphagia, pharyngoesophageal phase (R13.14) Attention and concentration deficit following -- Frontal lobe and executive function deficit following -- Impact on safety and function Mild aspiration risk   CHL IP TREATMENT RECOMMENDATION 02/19/2017 Treatment Recommendations Therapy as outlined in treatment plan below   Prognosis 02/18/2017 Prognosis for Safe Diet Advancement (No Data) Barriers to Reach Goals -- Barriers/Prognosis Comment -- CHL IP DIET RECOMMENDATION 02/19/2017 SLP Diet Recommendations Other (Comment) Liquid Administration via Straw Medication Administration Crushed with puree Compensations Follow solids with liquid Postural Changes Seated upright at 90 degrees   CHL IP OTHER RECOMMENDATIONS 02/19/2017 Recommended Consults Consider esophageal assessment Oral Care Recommendations Oral care BID Other Recommendations --   CHL IP FOLLOW UP RECOMMENDATIONS 02/19/2017 Follow up Recommendations (No Data)   CHL IP FREQUENCY AND DURATION 02/19/2017 Speech Therapy Frequency (ACUTE ONLY) min 2x/week Treatment Duration 1 week      CHL IP ORAL PHASE 02/19/2017 Oral Phase WFL Oral - Pudding Teaspoon -- Oral - Pudding Cup -- Oral - Honey Teaspoon -- Oral - Honey Cup -- Oral - Nectar Teaspoon -- Oral - Nectar Cup -- Oral - Nectar Straw -- Oral - Thin Teaspoon -- Oral - Thin Cup -- Oral - Thin Straw -- Oral - Puree -- Oral - Mech Soft -- Oral - Regular -- Oral -  Multi-Consistency -- Oral - Pill -- Oral Phase - Comment --  CHL IP PHARYNGEAL PHASE 02/19/2017 Pharyngeal Phase WFL Pharyngeal- Pudding Teaspoon -- Pharyngeal -- Pharyngeal- Pudding Cup -- Pharyngeal -- Pharyngeal- Honey Teaspoon -- Pharyngeal -- Pharyngeal- Honey Cup -- Pharyngeal -- Pharyngeal- Nectar Teaspoon -- Pharyngeal -- Pharyngeal- Nectar Cup -- Pharyngeal -- Pharyngeal- Nectar Straw -- Pharyngeal -- Pharyngeal- Thin Teaspoon -- Pharyngeal -- Pharyngeal- Thin Cup -- Pharyngeal -- Pharyngeal- Thin Straw -- Pharyngeal -- Pharyngeal- Puree -- Pharyngeal -- Pharyngeal- Mechanical Soft -- Pharyngeal -- Pharyngeal- Regular -- Pharyngeal -- Pharyngeal- Multi-consistency -- Pharyngeal -- Pharyngeal- Pill -- Pharyngeal -- Pharyngeal Comment --  [facility] IP CERVICAL ESOPHAGEAL PHASE 02/19/2017 Cervical Esophageal Phase Impaired Pudding Teaspoon -- Pudding Cup -- Honey Teaspoon -- Honey Cup -- Nectar Teaspoon -- Nectar Cup -- Nectar Straw -- Thin Teaspoon -- Thin Cup -- Thin Straw -- Puree -- Mechanical Soft -- Regular -- Multi-consistency -- Pill -- Cervical Esophageal Comment (No Data) No flowsheet data found. [name] 02/19/2017, 1:11 PM               Assessment/Plan: Diagnosis: Debility Labs and images independently reviewed.  Records reviewed and summated above.  1. Does the need for close, 24 hr/day medical supervision in concert with the patient's rehab needs make it unreasonable for this patient to be served in a less intensive setting? Yes 2. Co-Morbidities requiring supervision/potential complications: dysphagia (advance diet as tolerated), A fib (monitor HR with increased physical activity), recent NSTEMI (cont meds), achalasia (see dysphagia),  DJD/DDD with chronic pain (Biofeedback training with therapies to help reduce reliance on opiate pain medications, monitor pain control during therapies, and sedation at rest and titrate to maximum efficacy to ensure participation and gains in  therapies), tachypnea (monitor RR and O2 Sats with increased physical exertion), hypokalemia (continue to monitor and replete as necessary), leukocytosis (cont to monitor for signs and symptoms of infection, further workup if indicated) 3. Due to safety, skin/wound care, disease management, pain management and patient education, does the patient require 24 hr/day rehab nursing? Yes 4. Does the patient require coordinated care of a physician, rehab nurse, PT (1-2 hrs/day, 5 days/week), OT (1-2 hrs/day, 5 days/week) and SLP (1-2 hrs/day, 5 days/week) to address physical and functional deficits in the context of the above medical diagnosis(es)? Yes Addressing deficits in the following areas: balance, endurance, locomotion, strength, transferring, bathing, dressing, toileting, cognition, swallowing and psychosocial support 5. Can the patient actively participate in an intensive therapy program of at least 3 hrs of therapy per day at least 5 days per week? Potentially 6. The potential for patient to make measurable gains while on inpatient rehab is excellent 7. Anticipated functional outcomes upon discharge from inpatient rehab are supervision  with PT, modified independent and supervision with OT, modified independent with SLP. 8. Estimated rehab length of stay to reach the above functional goals is: 12-16 days. 9. Anticipated D/C setting: Home 10. Anticipated post D/C treatments: HH therapy and Home excercise program 11. Overall Rehab/Functional Prognosis: excellent  RECOMMENDATIONS: This patient's condition is appropriate for continued rehabilitative care in the following setting: CIR Patient has agreed to participate in recommended program. Potentially Note that insurance prior authorization may be required for reimbursement for recommended care.  Comment: Rehab Admissions Coordinator to follow up.  Delice Lesch, MD, [address] [place] 02/20/2017

## 2017-02-20 NOTE — Progress Notes (Addendum)
[address]       [place] [address]             [phone]        CARDIOTHORACIC SURGERY PROGRESS NOTE   R5 Days Post-Op Procedure(s) (LRB): BENTALL AORTIC ROOT REPLACEMENT PROCEDURE, REPAIR ASCENDING AORTIC DISSECTION (N/A) TRANSESOPHAGEAL ECHOCARDIOGRAM (TEE) (N/A) CLIPPING OF ATRIAL APPENDAGE (Left) DRAINAGE OF PERICARDIAL FLUID CORONARY ARTERY BYPASS GRAFTING (CABG) x  one, using left saphenous vein (N/A) AORTIC VALVE REPLACEMENT (AVR) (N/A)  Subjective: Looks remarkably good and reports feeling well.  Expected soreness in chest.  Chronic pain in neck stable.  Right hip reportedly improved.  Marginal appetite but eating some and tolerating dysphagia I diet.  Weak but spunky.  Objective: Vital signs: BP Readings from Last 1 Encounters:  02/20/17 137/88   Pulse Readings from Last 1 Encounters:  02/20/17 78   Resp Readings from Last 1 Encounters:  02/20/17 17   Temp Readings from Last 1 Encounters:  02/20/17 98 F (36.7 C) (Oral)    Hemodynamics:   Mixed venous co-ox 59%   Physical Exam:  Rhythm:   Afib w/ controlled rate  Breath sounds: Clear, slightly diminished at bases  Heart sounds:  irregular  Incisions:  Clean and dry  Abdomen:  Soft, non-distended, non-tender  Extremities:  Warm, well-perfused, edema improved, finger tips and toes improved   Intake/Output from previous day: 05/20 0701 - 05/21 0700 In: 537.4 [I.V.:387.4; IV Piggyback:150] Out: 2957 [Urine:2535; Drains:360; [place]; Chest Tube:60] Intake/Output this shift: No intake/output data recorded.  Lab Results:  CBC: Recent Labs  02/19/17 0437 02/19/17 1559 02/20/17 0440  WBC 16.9*  --  14.8*  HGB 10.5* 10.2* 10.0*  HCT 31.7* 30.0* 31.1*  PLT 217  --  232    BMET:  Recent Labs  02/19/17 0437 02/19/17 1559 02/20/17 0440  NA 133* 134* 132*  K 3.2* 3.4* 3.3*  CL 97* 92* 95*  CO2 30  --  30  GLUCOSE 94 118* 102*  BUN 17 19 14   CREATININE 0.75 0.70  0.60  CALCIUM 8.2*  --  7.7*     PT/INR:  No results for input(s): LABPROT, INR in the last 72 hours.  CBG (last 3)   Recent Labs  02/19/17 1100 02/19/17 1626 02/19/17 2120  GLUCAP 135* 99 101*    ABG    Component Value Date/Time   PHART 7.343 (L) 02/16/2017 1104   PCO2ART 44.3 02/16/2017 1104   PO2ART 82.0 (L) 02/16/2017 1104   HCO3 24.1 02/16/2017 1104   TCO2 30 02/19/2017 1559   ACIDBASEDEF 2.0 02/16/2017 1104   O2SAT 59.1 02/20/2017 0435    CXR: PORTABLE CHEST 1 VIEW  COMPARISON:  02/19/2017.  FINDINGS: Right PICC line tip projects at the level of the distal superior vena cava/ cavoatrial junction. Right internal jugular catheter has been removed.  Post aortic valve replacement and left atrial appendage clipping. Cardiomegaly.  Central pulmonary vascular congestion.  Perihilar/basilar subsegmental atelectasis.  There may be small pleural effusions.  No pneumothorax.  Calcified tortuous aorta.  Bilateral prominent shoulder joint degenerative changes.  IMPRESSION: Right internal jugular catheter has been removed.  Right PICC catheter tip distal superior vena cava/ cavoatrial junction level.  Cardiomegaly post aortic valve replacement and left atrial appendage clipping.  Central pulmonary vascular prominence.  Subsegmental atelectasis perihilar region lung bases.  There may be small bilateral pleural effusions.   Electronically Signed   By: Genia Del M.D.   On:  02/20/2017 07:36   Assessment/Plan: S/P Procedure(s) (LRB): BENTALL AORTIC ROOT REPLACEMENT PROCEDURE, REPAIR ASCENDING AORTIC DISSECTION (N/A) TRANSESOPHAGEAL ECHOCARDIOGRAM (TEE) (N/A) CLIPPING OF ATRIAL APPENDAGE (Left) DRAINAGE OF PERICARDIAL FLUID CORONARY ARTERY BYPASS GRAFTING (CABG) x  one, using left saphenous vein (N/A) AORTIC VALVE REPLACEMENT (AVR) (N/A)  Overall doing remarkably well POD5 Maintaining rate-controlled Afib w/ stable  BP Breathing comfortably w/ O2 sats 95-100% on 2 L/min Hypertension, well controlled Long-standing persistent atrial fibrillation, rate controlled Acute on chronic diastolic CHF with expected post-op volume excess, diuresing very well but still 12-15 lbs > preop baseline Expected post op acute blood loss anemia with preop iron-deficient anemia, stable Leukocytosis w/out fever or other signs of infection, likely reactive, trending down Hypokalemia, induced by loop diuretics Recent fall with right pubic ramus fracture, stable - tolerating gradually increased mobility Generalized weakness and physical deconditioning 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

## 2017-02-20 NOTE — Progress Notes (Signed)
Pacing wires pulled as per orders and per protocol.  Tips intact.  Pulled without difficulty.  VS-every 15 min as per protocol x 1 hour.  Tolerated well

## 2017-02-20 NOTE — Progress Notes (Signed)
  Speech Language Pathology Treatment: Dysphagia  Patient Details Name: Lydia Jordan MRN: [id number] DOB: [birth year] Today's Date: 02/20/2017 Time: 5465-6812 SLP Time Calculation (min) (ACUTE ONLY): 18 min  Assessment / Plan / Recommendation Clinical Impression  Received a call from RN stating that patient is having difficulty with meds crushed in puree, gagging/coughing due to dislike of taste with resultant increase in RR, wheezing. MBS from 5/20 reviewed. Skilled observation complete with patient consuming meds whole, one at a time, with thin liquids. Patient without overt indication of difficulty or aspiration with meds with thin liquids, thin liquids consuming via straw independently, or trials of regular texture solids. Patient was consuming regular texture solids as well as meds whole prior to admission despite presence of known esophageal stricture. Reports gradual onset of reoccurring GI symptom since Botox complete last year. No reports of PNA or respiratory infection, difficulty breathing prior to admission. At this time, recommend advancing diet to dysphagia 3 (for energy conservation and esophageal clearance) solids, thin liquids with strict use of aspiration precautions. Will f/u closely for tolerance. Daughter present and educated regarding recommendations, verbalized understanding.    HPI HPI: Pt is an 83 y.o. female with PMH of atrial fibrillation on eliquis, HTN, hypothyroidism, acid reflux, recent NSTEMI, aortic dissection. Pt had a mechanical fall on 5/9, during which time she bruised her face and hip. She had pain after the fall and felt she needed to be seen, therefore presenting 5/12. Given that she is on anticoagulation, she underwent CT of head and pelvis. Initial CT concerning for unclear mass vs. Consolidation at left lung apex, so repeat CT with contrast performed. CT showed type A dissection. Underwent CT surgery on 5/16 and was extubated 5/17. CXR 5/19 showed mild  bilateral atelectasis. Bedside swallow eval ordered due to coughing observed with oral intake per MD. Pt currently on dysphagia 1 diet/ nectar thick liquids. barium swallow 06/2015: Significant tertiary contractions in the mid and distal esophagus; short segment stricture of the distal esophagus just above the small hiatal hernia. The barium pill lodges at the level of the short segment distal esophageal stricture; prominent cricopharyngeus muscle.      SLP Plan  Continue with current plan of care       Recommendations  Diet recommendations: Dysphagia 3 (mechanical soft);Thin liquid Liquids provided via: Cup;Straw Medication Administration: Whole meds with liquid Supervision: Patient able to self feed;Intermittent supervision to cue for compensatory strategies Compensations: Slow rate;Small sips/bites;Follow solids with liquid Postural Changes and/or Swallow Maneuvers: Seated upright 90 degrees;Upright 30-60 min after meal                Oral Care Recommendations: Oral care BID Follow up Recommendations: None SLP Visit Diagnosis: Dysphagia, pharyngoesophageal phase (R13.14) Plan: Continue with current plan of care       [place], CCC-SLP [phone]       Atilano Covelli Meryl 02/20/2017, 1:39 PM

## 2017-02-20 NOTE — Progress Notes (Signed)
Physical Therapy Treatment Patient Details Name: Lydia Jordan MRN: [id number] DOB: 04/17/1934 Today's Date: 02/20/2017    History of Present Illness Pt is an 83 y.o. female with a history of chronic atrial fibrillation on ELIQUIS, recent non-STEMI with minimal CAD as well as hypertension and hypothyroidism who presented after mechanical fall on May 19. She suffered a pelvic fracture. Pt underwent a CT scan which revealed a type a aortic dissection complicated by pericardial effusion. Pt is now s/p CABG, aneurysm repair and drainage of pericardial effusion on 5/16.     PT Comments    Pt tolerated amb well this date. Limited by SOB and fatigue. V/c's to minimize UE WBing to adhere to sternal precautions during ambulation. Pt with improved WBing tolerance with R LE this date as well. Acute PT to con't to follow. Pt very motivated and progressing. Pt to benefit from CIR upon d/c to achieve maximal functional recovery.   Follow Up Recommendations  CIR     Equipment Recommendations  None recommended by PT    Recommendations for Other Services Rehab consult     Precautions / Restrictions Precautions Precautions: Fall;Sternal Precaution Comments: pt re-educated on sternal precautions Restrictions Weight Bearing Restrictions: No RLE Weight Bearing: Weight bearing as tolerated    Mobility  Bed Mobility Overal bed mobility: Needs Assistance Bed Mobility: Supine to Sit     Supine to sit: HOB elevated;Min assist     General bed mobility comments: minA for trunk elevation and to bring hips to EOB  Transfers Overall transfer level: Needs assistance Equipment used: 2 person hand held assist Transfers: Sit to/from Stand Sit to Stand: Min assist;+2 physical assistance         General transfer comment: minA to power up due to inability to use UEs to push up, minA to steady pt due to R LE soreness  Ambulation/Gait Ambulation/Gait assistance: Min assist;+2  safety/equipment Ambulation Distance (Feet): 25 Feet Assistive device: Rolling walker (2 wheeled) Gait Pattern/deviations: Step-to pattern;Decreased stride length;Decreased step length - left;Decreased stance time - right Gait velocity: slow Gait velocity interpretation: Below normal speed for age/gender General Gait Details: v/c's to minimize bilat UE Wbing due to sternal precautions. pt with + SOB, pt on 2Lo2 via [place], unable to get SpO2 ready until 5 min after amb which was 95% pt able to tolerate increased R LE WBing   Stairs            Wheelchair Mobility    Modified Rankin (Stroke Patients Only)       Balance Overall balance assessment: Needs assistance Sitting-balance support: Feet supported Sitting balance-Leahy Scale: Fair     Standing balance support: Bilateral upper extremity supported Standing balance-Leahy Scale: Poor Standing balance comment: reliant on external supports                            Cognition Arousal/Alertness: Awake/alert Behavior During Therapy: WFL for tasks assessed/performed Overall Cognitive Status: Within Functional Limits for tasks assessed                                 General Comments: pt with good spirits and able to follow commands consistently      Exercises      General Comments        Pertinent Vitals/Pain Pain Assessment: 0-10 Pain Score: 4  Pain Location: R hip Pain Descriptors / Indicators: Sore Pain  Intervention(s): Monitored during session    Home Living                      Prior Function            PT Goals (current goals can now be found in the care plan section) Acute Rehab PT Goals Patient Stated Goal: go home Progress towards PT goals: Progressing toward goals    Frequency    Min 3X/week      PT Plan Current plan remains appropriate    Co-evaluation              AM-PAC PT "6 Clicks" Daily Activity  Outcome Measure  Difficulty turning over in  bed (including adjusting bedclothes, sheets and blankets)?: A Lot Difficulty moving from lying on back to sitting on the side of the bed? : A Lot Difficulty sitting down on and standing up from a chair with arms (e.g., wheelchair, bedside commode, etc,.)?: A Lot Help needed moving to and from a bed to chair (including a wheelchair)?: A Lot Help needed walking in hospital room?: A Little Help needed climbing 3-5 steps with a railing? : A Lot 6 Click Score: 13    End of Session Equipment Utilized During Treatment: Oxygen Activity Tolerance: Patient tolerated treatment well Patient left: in chair;with call bell/phone within reach;with family/visitor present Nurse Communication: Mobility status PT Visit Diagnosis: Other abnormalities of gait and mobility (R26.89) Pain - Right/Left: Right Pain - part of body: Hip     Time: 1201-1221 PT Time Calculation (min) (ACUTE ONLY): 20 min  Charges:  $Gait Training: 8-22 mins                    G Codes:       Kittie Plater, PT, DPT Pager #: [phone] Office #: [phone]    [place] 02/20/2017, 1:10 PM

## 2017-02-21 ENCOUNTER — Inpatient Hospital Stay (HOSPITAL_COMMUNITY)
Admission: RE | Admit: 2017-02-21 | Discharge: 2017-02-23 | DRG: 296 | Disposition: A | Discharge status: Other Acute Inpatient Hospital | Payer: Medicare Other | Source: Intra-hospital | Attending: Physical Medicine & Rehabilitation | Admitting: Physical Medicine & Rehabilitation

## 2017-02-21 DIAGNOSIS — R296 Repeated falls: Secondary | ICD-10-CM | POA: Diagnosis present

## 2017-02-21 DIAGNOSIS — I469 Cardiac arrest, cause unspecified: Secondary | ICD-10-CM | POA: Diagnosis present

## 2017-02-21 DIAGNOSIS — Z7901 Long term (current) use of anticoagulants: Secondary | ICD-10-CM

## 2017-02-21 DIAGNOSIS — Z951 Presence of aortocoronary bypass graft: Secondary | ICD-10-CM | POA: Diagnosis not present

## 2017-02-21 DIAGNOSIS — Z66 Do not resuscitate: Secondary | ICD-10-CM | POA: Diagnosis present

## 2017-02-21 DIAGNOSIS — R0602 Shortness of breath: Secondary | ICD-10-CM

## 2017-02-21 DIAGNOSIS — I481 Persistent atrial fibrillation: Secondary | ICD-10-CM | POA: Diagnosis present

## 2017-02-21 DIAGNOSIS — I5033 Acute on chronic diastolic (congestive) heart failure: Secondary | ICD-10-CM | POA: Diagnosis present

## 2017-02-21 DIAGNOSIS — I110 Hypertensive heart disease with heart failure: Secondary | ICD-10-CM | POA: Diagnosis present

## 2017-02-21 DIAGNOSIS — R0989 Other specified symptoms and signs involving the circulatory and respiratory systems: Secondary | ICD-10-CM | POA: Diagnosis not present

## 2017-02-21 DIAGNOSIS — I2510 Atherosclerotic heart disease of native coronary artery without angina pectoris: Secondary | ICD-10-CM | POA: Diagnosis present

## 2017-02-21 DIAGNOSIS — I7101 Dissection of thoracic aorta: Secondary | ICD-10-CM

## 2017-02-21 DIAGNOSIS — I5031 Acute diastolic (congestive) heart failure: Secondary | ICD-10-CM | POA: Diagnosis not present

## 2017-02-21 DIAGNOSIS — Z79899 Other long term (current) drug therapy: Secondary | ICD-10-CM

## 2017-02-21 DIAGNOSIS — D72829 Elevated white blood cell count, unspecified: Secondary | ICD-10-CM | POA: Diagnosis present

## 2017-02-21 DIAGNOSIS — I252 Old myocardial infarction: Secondary | ICD-10-CM | POA: Diagnosis not present

## 2017-02-21 DIAGNOSIS — E871 Hypo-osmolality and hyponatremia: Secondary | ICD-10-CM | POA: Diagnosis present

## 2017-02-21 DIAGNOSIS — Z7982 Long term (current) use of aspirin: Secondary | ICD-10-CM

## 2017-02-21 DIAGNOSIS — E039 Hypothyroidism, unspecified: Secondary | ICD-10-CM | POA: Diagnosis present

## 2017-02-21 DIAGNOSIS — Z515 Encounter for palliative care: Secondary | ICD-10-CM | POA: Diagnosis present

## 2017-02-21 DIAGNOSIS — Z952 Presence of prosthetic heart valve: Secondary | ICD-10-CM | POA: Diagnosis not present

## 2017-02-21 DIAGNOSIS — R5381 Other malaise: Secondary | ICD-10-CM | POA: Diagnosis not present

## 2017-02-21 DIAGNOSIS — S3282XS Multiple fractures of pelvis without disruption of pelvic ring, sequela: Secondary | ICD-10-CM

## 2017-02-21 DIAGNOSIS — S329XXS Fracture of unspecified parts of lumbosacral spine and pelvis, sequela: Secondary | ICD-10-CM | POA: Diagnosis not present

## 2017-02-21 DIAGNOSIS — G9340 Encephalopathy, unspecified: Secondary | ICD-10-CM

## 2017-02-21 DIAGNOSIS — J90 Pleural effusion, not elsewhere classified: Secondary | ICD-10-CM | POA: Diagnosis not present

## 2017-02-21 DIAGNOSIS — J9811 Atelectasis: Secondary | ICD-10-CM | POA: Diagnosis not present

## 2017-02-21 LAB — GLUCOSE, CAPILLARY: GLUCOSE-CAPILLARY: 82 mg/dL (ref 65–99)

## 2017-02-21 LAB — CBC
HEMATOCRIT: 33.1 % — AB (ref 36.0–46.0)
Hemoglobin: 10.5 g/dL — ABNORMAL LOW (ref 12.0–15.0)
MCH: 29.2 pg (ref 26.0–34.0)
MCHC: 31.7 g/dL (ref 30.0–36.0)
MCV: 91.9 fL (ref 78.0–100.0)
PLATELETS: 276 10*3/uL (ref 150–400)
RBC: 3.6 MIL/uL — ABNORMAL LOW (ref 3.87–5.11)
RDW: 14.6 % (ref 11.5–15.5)
WBC: 15.8 10*3/uL — AB (ref 4.0–10.5)

## 2017-02-21 LAB — COMPREHENSIVE METABOLIC PANEL
ALT: 19 U/L (ref 14–54)
AST: 22 U/L (ref 15–41)
Albumin: 2.3 g/dL — ABNORMAL LOW (ref 3.5–5.0)
Alkaline Phosphatase: 80 U/L (ref 38–126)
Anion gap: 6 (ref 5–15)
BILIRUBIN TOTAL: 1.2 mg/dL (ref 0.3–1.2)
BUN: 17 mg/dL (ref 6–20)
CO2: 32 mmol/L (ref 22–32)
Calcium: 8.2 mg/dL — ABNORMAL LOW (ref 8.9–10.3)
Chloride: 95 mmol/L — ABNORMAL LOW (ref 101–111)
Creatinine, Ser: 0.62 mg/dL (ref 0.44–1.00)
Glucose, Bld: 88 mg/dL (ref 65–99)
POTASSIUM: 4.3 mmol/L (ref 3.5–5.1)
Sodium: 133 mmol/L — ABNORMAL LOW (ref 135–145)
TOTAL PROTEIN: 5.2 g/dL — AB (ref 6.5–8.1)

## 2017-02-21 LAB — PREALBUMIN: PREALBUMIN: 8.8 mg/dL — AB (ref 18–38)

## 2017-02-21 MED ORDER — TRAMADOL HCL 50 MG PO TABS: 50.0000 mg | ORAL_TABLET | ORAL | 0 refills | Status: DC | PRN

## 2017-02-21 MED ORDER — IPRATROPIUM-ALBUTEROL 0.5-2.5 (3) MG/3ML IN SOLN
3.0000 mL | RESPIRATORY_TRACT | Status: DC | PRN
  Administered 2017-02-22 – 2017-02-23 (×3): 3 mL via RESPIRATORY_TRACT
  Filled 2017-02-21 (×3): qty 3

## 2017-02-21 MED ORDER — ASPIRIN EC 81 MG PO TBEC: 81.0000 mg | DELAYED_RELEASE_TABLET | Freq: Every day | ORAL | Status: DC

## 2017-02-21 MED ORDER — POTASSIUM CHLORIDE 20 MEQ PO PACK
40.0000 meq | PACK | Freq: Two times a day (BID) | ORAL | Status: DC
  Administered 2017-02-21 – 2017-02-22 (×3): 40 meq via ORAL
  Filled 2017-02-21 (×4): qty 2

## 2017-02-21 MED ORDER — ASPIRIN 81 MG PO TBEC: 81.0000 mg | DELAYED_RELEASE_TABLET | Freq: Every day | ORAL | Status: AC

## 2017-02-21 MED ORDER — DIPHENHYDRAMINE HCL 12.5 MG/5ML PO ELIX: 12.5000 mg | ORAL_SOLUTION | Freq: Four times a day (QID) | ORAL | Status: DC | PRN

## 2017-02-21 MED ORDER — ATORVASTATIN CALCIUM 20 MG PO TABS
20.0000 mg | ORAL_TABLET | Freq: Every day | ORAL | Status: DC
  Administered 2017-02-22 – 2017-02-23 (×2): 20 mg via ORAL
  Filled 2017-02-21 (×2): qty 1

## 2017-02-21 MED ORDER — PROCHLORPERAZINE 25 MG RE SUPP: 12.5000 mg | Freq: Four times a day (QID) | RECTAL | Status: DC | PRN

## 2017-02-21 MED ORDER — POLYSACCHARIDE IRON COMPLEX 150 MG PO CAPS
150.0000 mg | ORAL_CAPSULE | Freq: Two times a day (BID) | ORAL | Status: DC
  Administered 2017-02-21 – 2017-02-23 (×4): 150 mg via ORAL
  Filled 2017-02-21 (×4): qty 1

## 2017-02-21 MED ORDER — FUROSEMIDE 20 MG PO TABS
20.0000 mg | ORAL_TABLET | Freq: Two times a day (BID) | ORAL | Status: DC
  Administered 2017-02-21 – 2017-02-22 (×2): 20 mg via ORAL
  Filled 2017-02-21 (×2): qty 1

## 2017-02-21 MED ORDER — PANTOPRAZOLE SODIUM 40 MG PO TBEC
80.0000 mg | DELAYED_RELEASE_TABLET | Freq: Every day | ORAL | Status: DC
  Administered 2017-02-22 – 2017-02-23 (×2): 80 mg via ORAL
  Filled 2017-02-21 (×2): qty 2

## 2017-02-21 MED ORDER — ACETAMINOPHEN 325 MG PO TABS
325.0000 mg | ORAL_TABLET | ORAL | Status: DC | PRN
  Administered 2017-02-23 (×2): 650 mg via ORAL
  Filled 2017-02-21 (×2): qty 2

## 2017-02-21 MED ORDER — FLEET ENEMA 7-19 GM/118ML RE ENEM: 1.0000 | ENEMA | Freq: Once | RECTAL | Status: DC | PRN

## 2017-02-21 MED ORDER — FA-PYRIDOXINE-CYANOCOBALAMIN 2.5-25-2 MG PO TABS
1.0000 | ORAL_TABLET | Freq: Every day | ORAL | Status: DC
  Administered 2017-02-22 – 2017-02-23 (×2): 1 via ORAL
  Filled 2017-02-21 (×3): qty 1

## 2017-02-21 MED ORDER — APIXABAN 2.5 MG PO TABS
2.5000 mg | ORAL_TABLET | Freq: Two times a day (BID) | ORAL | Status: DC
Start: 2017-02-21 — End: 2017-02-21

## 2017-02-21 MED ORDER — PROCHLORPERAZINE EDISYLATE 5 MG/ML IJ SOLN: 5.0000 mg | Freq: Four times a day (QID) | INTRAMUSCULAR | Status: DC | PRN

## 2017-02-21 MED ORDER — SODIUM CHLORIDE 0.9% FLUSH
10.0000 mL | Freq: Two times a day (BID) | INTRAVENOUS | Status: DC
  Administered 2017-02-21: 10 mL

## 2017-02-21 MED ORDER — TRAZODONE HCL 50 MG PO TABS
25.0000 mg | ORAL_TABLET | Freq: Every evening | ORAL | Status: DC | PRN
  Administered 2017-02-22: 50 mg via ORAL
  Filled 2017-02-21: qty 1

## 2017-02-21 MED ORDER — LEVOTHYROXINE SODIUM 100 MCG PO TABS
100.0000 ug | ORAL_TABLET | Freq: Every day | ORAL | Status: DC
  Administered 2017-02-22 – 2017-02-23 (×2): 100 ug via ORAL
  Filled 2017-02-21 (×2): qty 1

## 2017-02-21 MED ORDER — DULOXETINE HCL 30 MG PO CPEP
60.0000 mg | ORAL_CAPSULE | Freq: Every day | ORAL | Status: DC
  Administered 2017-02-22 – 2017-02-23 (×2): 60 mg via ORAL
  Filled 2017-02-21 (×2): qty 2

## 2017-02-21 MED ORDER — APIXABAN 2.5 MG PO TABS: 2.5000 mg | ORAL_TABLET | Freq: Two times a day (BID) | ORAL | Status: AC

## 2017-02-21 MED ORDER — DOCUSATE SODIUM 100 MG PO CAPS
200.0000 mg | ORAL_CAPSULE | Freq: Every day | ORAL | Status: DC
  Administered 2017-02-22 – 2017-02-23 (×2): 200 mg via ORAL
  Filled 2017-02-21 (×2): qty 2

## 2017-02-21 MED ORDER — DULOXETINE HCL 30 MG PO CPEP
30.0000 mg | ORAL_CAPSULE | Freq: Every day | ORAL | Status: DC
  Administered 2017-02-21 – 2017-02-22 (×2): 30 mg via ORAL
  Filled 2017-02-21 (×2): qty 1

## 2017-02-21 MED ORDER — METHOCARBAMOL 500 MG PO TABS
500.0000 mg | ORAL_TABLET | Freq: Four times a day (QID) | ORAL | Status: DC | PRN
  Administered 2017-02-22: 500 mg via ORAL
  Filled 2017-02-21: qty 1

## 2017-02-21 MED ORDER — ALUM & MAG HYDROXIDE-SIMETH 200-200-20 MG/5ML PO SUSP: 30.0000 mL | ORAL | Status: DC | PRN

## 2017-02-21 MED ORDER — CARVEDILOL 3.125 MG PO TABS
3.1250 mg | ORAL_TABLET | Freq: Two times a day (BID) | ORAL | Status: AC
Start: 2017-02-21 — End: ?

## 2017-02-21 MED ORDER — PROCHLORPERAZINE MALEATE 5 MG PO TABS: 5.0000 mg | ORAL_TABLET | Freq: Four times a day (QID) | ORAL | Status: DC | PRN

## 2017-02-21 MED ORDER — DIGOXIN 125 MCG PO TABS
0.1250 mg | ORAL_TABLET | Freq: Every morning | ORAL | Status: DC
  Administered 2017-02-22 – 2017-02-23 (×2): 0.125 mg via ORAL
  Filled 2017-02-21 (×2): qty 1

## 2017-02-21 MED ORDER — APIXABAN 2.5 MG PO TABS
2.5000 mg | ORAL_TABLET | Freq: Two times a day (BID) | ORAL | Status: DC
  Administered 2017-02-21 – 2017-02-23 (×4): 2.5 mg via ORAL
  Filled 2017-02-21 (×4): qty 1

## 2017-02-21 MED ORDER — SODIUM CHLORIDE 0.9% FLUSH
10.0000 mL | INTRAVENOUS | Status: DC | PRN
  Administered 2017-02-22: 10 mL
  Filled 2017-02-21: qty 40

## 2017-02-21 MED ORDER — POLYETHYLENE GLYCOL 3350 17 G PO PACK: 17.0000 g | PACK | Freq: Every day | ORAL | Status: DC | PRN

## 2017-02-21 MED ORDER — FUROSEMIDE 10 MG/ML IJ SOLN: 20.0000 mg | Freq: Two times a day (BID) | INTRAMUSCULAR | Status: DC

## 2017-02-21 MED ORDER — CHLORHEXIDINE GLUCONATE CLOTH 2 % EX PADS: 6.0000 | MEDICATED_PAD | Freq: Every day | CUTANEOUS | Status: DC

## 2017-02-21 MED ORDER — GUAIFENESIN-DM 100-10 MG/5ML PO SYRP
5.0000 mL | ORAL_SOLUTION | Freq: Four times a day (QID) | ORAL | Status: DC | PRN
  Administered 2017-02-22 – 2017-02-23 (×2): 10 mL via ORAL
  Filled 2017-02-21 (×2): qty 10

## 2017-02-21 MED ORDER — TRAMADOL HCL 50 MG PO TABS
50.0000 mg | ORAL_TABLET | ORAL | Status: DC | PRN
  Administered 2017-02-22: 100 mg via ORAL
  Filled 2017-02-21: qty 2

## 2017-02-21 MED ORDER — BISACODYL 10 MG RE SUPP: 10.0000 mg | Freq: Every day | RECTAL | Status: DC | PRN

## 2017-02-21 MED ORDER — BISACODYL 5 MG PO TBEC
10.0000 mg | DELAYED_RELEASE_TABLET | Freq: Every day | ORAL | Status: DC
  Administered 2017-02-22 – 2017-02-23 (×2): 10 mg via ORAL
  Filled 2017-02-21 (×2): qty 2

## 2017-02-21 MED ORDER — POTASSIUM CHLORIDE 20 MEQ PO PACK: 40.0000 meq | PACK | Freq: Two times a day (BID) | ORAL | Status: AC

## 2017-02-21 MED ORDER — CARVEDILOL 3.125 MG PO TABS
3.1250 mg | ORAL_TABLET | Freq: Two times a day (BID) | ORAL | Status: DC
  Administered 2017-02-21 – 2017-02-23 (×4): 3.125 mg via ORAL
  Filled 2017-02-21 (×4): qty 1

## 2017-02-21 MED ORDER — BISACODYL 10 MG RE SUPP: 10.0000 mg | Freq: Every day | RECTAL | Status: DC

## 2017-02-21 MED ORDER — FUROSEMIDE 40 MG PO TABS: 40.0000 mg | ORAL_TABLET | Freq: Two times a day (BID) | ORAL | 11 refills | Status: AC

## 2017-02-21 MED ORDER — ENSURE ENLIVE PO LIQD
237.0000 mL | Freq: Three times a day (TID) | ORAL | Status: DC
  Administered 2017-02-22 (×3): 237 mL via ORAL

## 2017-02-21 NOTE — H&P (Signed)
Physical Medicine and Rehabilitation Admission H&P     Chief Complaint  Patient presents with  . Debility due to recent surgery and pelvic fracture  HPI: Lydia Jordan is a 83 y.o. female with history of A fib, recent NSTEMI, non-obstructive CAD, ascending aortic aneurysm, achalasia, DJD/DDD with chronic pain, multiple recent falls who presented to ED 02/11/17 with reports of recent fall with hip pain and inability to walk. X rays revealed right pubic rami fracture, chronic right UPJ obstruction with moderate to severe hydronephrosis as well as type A aortic dissection. She was evaluated by Dr. Roxy Manns and was cleared to undergo repair of aortic dissection with AVR, drainage of pericardial fluid and CABG X 1 on 02/15/17. to be WBAT per Dr. Fredonia Highland. Post op on milironone for fluid overload and on low dose Lovenox for A fib. She has had reports of dysphagia and MBS done functional swallow with high penetration of thin liquids occasionally and modified diet recommended by ST. She was advanced to dysphagia 3 per patient request. CXR reviewed 5/21, stable changes. Therapy ongoing and patient limited by SOB and fatigue. Patient with significant deficits in mobility and ability to carry out ADL tasks. CIR recommended for follow up therapy. Independent PTA--has started using AD recently due to multiple falls. Sister and daughter supportive and can assist as needed after discharge.  Review of Systems  Unable to perform ROS: Mental acuity  HENT: Positive for hearing loss.  Respiratory: Negative for cough.  Cardiovascular: Negative for chest pain.  Musculoskeletal: Negative for myalgias.       Past Medical History:  Diagnosis Date  . Acid reflux   . Arthritis   . Ascending aortic aneurysm ([place]) 01/11/2017  . Ascending aortic dissection ([place]) 02/11/2017  . CAD (coronary artery disease)    Cath 01/11/17 showed 50% ost RCA and 50% mLAD --> medical therapy   . Cervical disc disease   . Chronic diastolic  congestive heart failure ([place])   . Chronic pain    Secondary to degenerative disc disease and arthritis  . Degenerative arthritis   . Hydronephrosis of right kidney   . Hypertension   . Hypothyroidism   . Hypothyroidism   . Longstanding persistent atrial fibrillation ([place])    originally diagnosed 2007  . NSTEMI (non-ST elevated myocardial infarction) ([place]) 01/10/2017  . Pubic ramus fracture, right, closed, initial encounter ([place]) 02/12/2017  . S/P ascending aortic dissection repair 02/15/2017   Straight graft replacement of sub-acute ascending aortic dissection with hemi-arch distal aortic reconstruction  . 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 AORTIC VALVE REPLACEMENT N/A 02/15/2017   Procedure: AORTIC VALVE REPLACEMENT (AVR); Surgeon: [name], MD; Location: [place]; Service: Open Heart Surgery; Laterality: N/A;  . BENTALL PROCEDURE N/A 02/15/2017   Procedure: BENTALL AORTIC ROOT REPLACEMENT PROCEDURE, REPAIR ASCENDING AORTIC DISSECTION; Surgeon: [name], MD; Location: [place]; Service: Open Heart Surgery; Laterality: N/A;  . CLIPPING OF ATRIAL APPENDAGE Left 02/15/2017   Procedure: CLIPPING OF ATRIAL APPENDAGE; Surgeon: [name], MD; Location: [place]; Service: Open Heart Surgery; Laterality: Left;  . CORONARY ARTERY BYPASS GRAFT N/A 02/15/2017   Procedure: CORONARY ARTERY BYPASS GRAFTING (CABG) x one, using left saphenous  vein; Surgeon: [name], MD; Location: [facility]; Service: Vascular; Laterality: N/A;  . ESOPHAGEAL MANOMETRY N/A 07/31/2015   Procedure: ESOPHAGEAL MANOMETRY (EM); Surgeon: Arta Silence, MD; Location: [facility]; Service: Endoscopy; Laterality: N/A;  .  ESOPHAGOGASTRODUODENOSCOPY (EGD) WITH PROPOFOL N/A 09/02/2015   Procedure: ESOPHAGOGASTRODUODENOSCOPY (EGD) WITH PROPOFOL; Surgeon: Arta Silence, MD; Location: [facility]; Service: Endoscopy; Laterality: N/A; botulinum toxin injection (100 Units; 25 units in 4 quadrants 1-2 cm proximal to GE junction  . LEFT HEART CATH AND CORONARY ANGIOGRAPHY N/A 01/11/2017   Procedure: Left Heart Cath and Coronary Angiography; Surgeon: Leonie Man, MD; Location: [facility] [facility]; Service: Cardiovascular; Laterality: N/A;  . PERICARDIAL FLUID DRAINAGE  02/15/2017   Procedure: DRAINAGE OF PERICARDIAL FLUID; Surgeon: [name], MD; Location: [place]; Service: Vascular;;  . skin cancer area removed      right leg healing well  . TEE WITHOUT CARDIOVERSION N/A 02/15/2017   Procedure: TRANSESOPHAGEAL ECHOCARDIOGRAM (TEE); Surgeon: [name], MD; Location: [place]; Service: Open Heart Surgery; Laterality: N/A;  . VAGINAL HYSTERECTOMY          Family History  Problem Relation Age of Onset  . Hypertension Mother   . Anuerysm Mother    died in her 17's  . Arthritis Father    died at age 72  . Heart attack Brother    died in 6's   Social History: Divorced. Lives in an in-law apartment next to daughter. Retired. Independent but limited by severe arthritis. She reports that she has quit smoking. She has never used smokeless tobacco. She reports that she does not drink alcohol or use drugs.       Allergies  Allergen Reactions  . Demerol [Meperidine] Anaphylaxis    Reaction: "Heart stopped"         Medications Prior to Admission  Medication Sig Dispense Refill  . apixaban (ELIQUIS) 5 MG TABS tablet Take 1 tablet (5 mg total) by mouth 2 (two) times daily.    Marland Kitchen atorvastatin (LIPITOR) 20 MG tablet Take 1 tablet (20 mg total) by mouth daily. 30 tablet 6  . diclofenac (VOLTAREN) 50 MG EC tablet Take 1 tablet (50 mg total) by mouth 2 (two) times daily. 180 tablet 3  . digoxin (LANOXIN) 0.125 MG  tablet Take 0.125 mg by mouth every morning.    . DULoxetine (CYMBALTA) 30 MG capsule Take 30 mg by mouth at bedtime.     . DULoxetine (CYMBALTA) 60 MG capsule Take 60 mg by mouth daily.  1  . HYDROcodone-acetaminophen (NORCO/VICODIN) 5-325 MG tablet Take 1 tablet by mouth 2 (two) times daily. Pain    . Liniments (SALONPAS PAIN RELIEF PATCH EX) Apply 1 patch topically daily as needed. For muscle pain    . nebivolol (BYSTOLIC) 5 MG tablet Take 5 mg by mouth every morning.    . nitroGLYCERIN (NITROSTAT) 0.4 MG SL tablet Place 1 tablet (0.4 mg total) under the tongue every 5 (five) minutes x 3 doses as needed for chest pain. 12 tablet 2  . omeprazole (PRILOSEC) 40 MG capsule Take 40 mg by mouth at bedtime.    Marland Kitchen SYNTHROID 100 MCG tablet Take 100 mcg by mouth daily before breakfast.  11   Home:  Home Living  Family/patient expects to be discharged to:: Private residence  Living Arrangements: Children  Available Help at Discharge: Family, Available 24 hours/day (daughter is self employed)  Type of Home: House  Home Access: Level entry  Home Layout: One level  Bathroom  Shower/Tub: Tourist information centre manager: Handicapped height  Bathroom Accessibility: Yes  Home Equipment: Environmental consultant - 4 wheels, Shower seat, Hand held shower head, Shower seat - built in, [employer] - tub/shower  Functional History:  Prior Function  Level of Independence: Independent  Functional Status:  Mobility:  Bed Mobility  Overal bed mobility: Needs Assistance  Bed Mobility: Supine to Sit  Supine to sit: HOB elevated, Min assist  Sit to supine: Mod assist  General bed mobility comments: pt in chair on arrival  Transfers  Overall transfer level: Needs assistance  Equipment used: 1 person hand held assist, Rolling walker (2 wheeled)  Transfers: Sit to/from Stand, Stand Pivot Transfers  Sit to Stand: Min assist  Stand pivot transfers: Min assist  General transfer comment: cues and assist to scoot to edge of chair,  cues for hands on knees, used momentum to stand from [facility] and chair  Ambulation/Gait  Ambulation/Gait assistance: Min assist, +2 safety/equipment  Ambulation Distance (Feet): 35 Feet  Assistive device: Rolling walker (2 wheeled)  Gait Pattern/deviations: Step-through pattern, Decreased stride length, Trunk flexed  General Gait Details: cues for posture and position in RW with chair to follow. Pt walked 35' and 30' with seated rest and chair follow  Gait velocity: slow  Gait velocity interpretation: Below normal speed for age/gender   ADL:  ADL  Overall ADL's : Needs assistance/impaired  Eating/Feeding: Set up, Sitting  Grooming: Minimal assistance, Sitting, Wash/dry hands, Wash/dry face  Upper Body Bathing: Moderate assistance, Sitting  Lower Body Bathing: Total assistance, Sit to/from stand  Upper Body Dressing : Minimal assistance, Sitting  Lower Body Dressing: Total assistance, Sit to/from stand  Toilet Transfer: Minimal assistance, Stand-pivot, BSC  Toileting- Clothing Manipulation and Hygiene: Moderate assistance, Sit to/from stand  Cognition:  Cognition  Overall Cognitive Status: Impaired/Different from baseline  Orientation Level: Oriented X4  Cognition  Arousal/Alertness: Awake/alert  Behavior During Therapy: Flat affect  Overall Cognitive Status: Impaired/Different from baseline  Area of Impairment: Memory, Following commands, Safety/judgement, Problem solving  Memory: Decreased short-term memory, Decreased recall of precautions  Following Commands: Follows one step commands with increased time  Safety/Judgement: Decreased awareness of safety, Decreased awareness of deficits  Problem Solving: Slow processing, Decreased initiation, Difficulty sequencing, Requires verbal cues, Requires tactile cues  General Comments: pt requiring repetition of instructions  Blood pressure 120/79, pulse 73, temperature 98.2 F (36.8 C), temperature source Oral, resp. rate 18, height 5\' 3"   (1.6 m), weight 66.6 kg (146 lb 12.8 oz), SpO2 (!) 67 %.  Physical Exam  Nursing note and vitals reviewed.  Constitutional: She appears well-developed and well-nourished. She appears lethargic. She is easily aroused.  HENT:  Head: Normocephalic and atraumatic.  Mouth/Throat: Oropharynx is clear and moist.  Resolving ecchymosis right forehead and periorbital area. Small hematoma right forehead along hairline.  Eyes: Conjunctivae and EOM are normal. Pupils are equal, round, and reactive to light.  Neck: Normal range of motion. Neck supple.  Cardiovascular: Normal rate. An irregularly irregular rhythm present.  Murmur heard.  Respiratory: Effort normal. No stridor. No respiratory distress. She has decreased breath sounds. She has wheezes. She has no rales.  Audible upper airway wheezing.  GI: Soft. Bowel sounds are normal. She exhibits no distension. There is no tenderness.  Musculoskeletal: She exhibits edema. She exhibits no tenderness.  JP drain left mid thigh incision. Bilateral feet cool to touch with erythema along edges--foot fungus. Callus on plantar surfaces on right. Onychomycotic toe nails.  Neurological: She is easily  aroused. She appears lethargic.  Lethargic and confused. Oriented to self and place. Had difficulty answering basic biographic questions with word finding difficulty. Distracted, had difficulty following simple motor commands with perseverative answers. Moved all 4 limbs automatically but struggled with purposeful movement. Sensed pain in all 4 limbs.  Skin: Skin is warm and dry.  Numerous bruises,ecchymoses over face and upper extremities.  Psychiatric: Her speech is delayed and tangential. She is slowed and withdrawn. Cognition and memory are impaired. She expresses inappropriate judgment.  Confused and distracted   Lab Results Last 48 Hours  Imaging Results (Last 48 hours)     Medical Problem List and Plan:  1. 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  -admit to inpatient rehab  2. DVT Prophylaxis/Anticoagulation: Pharmaceutical: Other (comment)--to resume eliquis today.  3. Chronic pain/Pain Management: Continue cymbalta bid. Will discontinue tramadol due as it likely contributing to confusion.  4. Mood: LCSW to follow for evaluation and support.  5. Neuropsych: This patient is not fully capable of making decisions on her own behalf.  -limit neurosedating medication as possible  -recheck UA and UCX  -pt with recent head trauma (CT negative for acute injury but with substantial atrophy)  6. Skin/Wound Care: Monitor wound daily for healing. Routine pressure relief measures.  7. Fluids/Electrolytes/Nutrition: Monitor I/O. Check lytes in am. Add protein supplement.  8. A fib: Monitor HR bid. Continue ASA, coreg and lanoxin for heart rate control.  10. HTN: Monitor BP bid--poorly controlled at this time. May need to add  12. Congestive heart failure: Monitor weight daily--on upward trend. Continue IV lasix.  13. Hypoxia: Encourage IS. Wean oxygen as tolerated.  14. Right pelvic fracture: WBAT  15. Achalasia/Dysphagia: Full supervision with modified diet for safety.  16. ABLA:  Monitor serially. Continue iron supplement. Recent epistaxis:  17. Persistent leucocytosis: Monitor for signs of infections.  10. Right hydronephrosis: ? Chronic. Renal status stable.  Post Admission Physician Evaluation:  1. Functional deficits secondary to polytrauma, debiliy. 2. Patient is admitted to receive collaborative, interdisciplinary care between the physiatrist, rehab nursing staff, and therapy team. 3. Patient's level of medical complexity and substantial therapy needs in context of that medical necessity cannot be provided at a lesser intensity of care such as a SNF. 4. Patient has experienced substantial functional loss from his/her baseline which was documented above under the "Functional History" and "Functional Status" headings. Judging by the patient's diagnosis, physical exam, and functional history, the patient has potential for functional progress which will result in measurable gains while on inpatient rehab. These gains will be of substantial and practical use upon discharge in facilitating mobility and self-care at the household level. 5. Physiatrist will provide 24 hour management of medical needs as well as oversight of the therapy plan/treatment and provide guidance as appropriate regarding the interaction of the two. 6. The Preadmission Screening has been reviewed and patient status is unchanged unless otherwise stated above. 7. 24 hour rehab nursing will assist with bladder management, bowel management, safety, skin/wound care, disease management, medication administration, pain management and patient education and help integrate therapy concepts, techniques,education, etc. 8. PT will assess and treat for/with: Lower extremity strength, range of motion, stamina, balance, functional mobility, safety, adaptive techniques and equipment, NMR, family education, activity tolerance. Goals are: mod I to supervision. 9. OT will assess and treat for/with: ADL's, functional mobility,  safety, upper extremity strength, adaptive techniques and equipment, NMR, activity tolerance, family education, community reentry. Goals are: mod I to supervision. Therapy may proceed with showering this patient. 10. SLP will assess and treat for/with: cognition, communication, family education. Goals are: mod I to supervision. 11. Case Management and Social Worker will assess and treat for psychological issues and discharge planning. 12. Team conference will be held weekly to assess progress toward goals and to determine barriers to discharge. 13. Patient will receive at least 3 hours of therapy per day at least 5 days per week. 14. ELOS: 13-16 days  15. Prognosis: excellent Meredith Staggers, MD, [facility]  [facility] Physical Medicine & Rehabilitation  02/21/2017  Flora Lipps  02/21/2017

## 2017-02-21 NOTE — Progress Notes (Signed)
I met with pt and her daughter at bedside to discuss a possible inpt rehab admission. They both are in agreement. I have a bed available today. I have discussed with staff RN and RN CM. I will make the arrangements to admit today. [phone]

## 2017-02-21 NOTE — PMR Pre-admission (Signed)
PMR Admission Coordinator Pre-Admission Assessment  Patient: Lydia Jordan is an 83 y.o., female MRN: [id number] DOB: 05-19-1934 Height: 5\' 3"  (160 cm) Weight: 66.6 kg (146 lb 12.8 oz)              Insurance Information HMO:     PPO:      PCP:      IPA:      80/20: yes     OTHER: no HMO PRIMARY: Medicare a and b      Policy#: [id number]      Subscriber: pt Benefits:  Phone #: online     Name: 02/21/2017 Eff. Date: 01/01/2001     Deduct: $1340      Out of Pocket Max: none      Life Max: none CIR: 100%      SNF: 20 full days Outpatient: 80%     Co-Pay: 20% Home Health: 100%      Co-Pay: none DME: 80%     Co-Pay: 20% Providers: pt choice  SECONDARY: AARP supplement      Policy#: [id number]      Subscriber: pt  Medicaid Application Date:       Case Manager:  Disability Application Date:       Case Worker:   Emergency [place]    Name Relation Home Work Mobile   Allen,Tina Daughter [phone]       Current Medical History  Patient Admitting Diagnosis: debility  History of Present Illness: HPI: Lydia Jordan is a 83 y.o. female with history of A fib, recent NSTEMI, achalasia, DJD/DDD with chronic pain who sustained a fall 02/08/17 with pain in right hip and decline in mobility.  Evaluation in ED 5/12 revealed right pubic rami fracture, chronic right UPJ obstruction with moderate to severe hydronephrosis as well as type A aortic dissection. She was evaluated by Dr. Roxy Manns and was cleared for repair of aneurysm on 02/15/17.She was taken to the operating room on 5/16.  She underwent CABG x 1 with SVG to RCA with open harvest of saphenous vein from left thigh, Clipping of LA Appendage, Drainage of Pericardial Effusion, Biologic Bentall procedure, and Repair of Ascending thoracic aortic dissection. Pubic rami fracture to be WBAT per Dr. Fredonia Highland. Post op on milironone for fluid overload and on low dose Lovenox for A fib. She has had reports of dysphagia and MBS  done functional swallow with high penetration of thin liquids occasionally and modified diet recommended by ST. CXR reviewed 5/21, stable changes. Therapy ongoing and patient limited by SOB and fatigue. Patient with significant deficits in mobility and ability to carry out ADL tasks. CIR recommended for follow up therapy. Independent PTA--has started using AD recently due to multiple falls. Sister and daughter supportive and can assist as needed after discharge.   Past Medical History  Past Medical History:  Diagnosis Date  . Acid reflux   . Arthritis   . Ascending aortic aneurysm ([place]) 01/11/2017  . Ascending aortic dissection ([place]) 02/11/2017  . CAD (coronary artery disease)    Cath 01/11/17 showed 50% ost RCA and 50% mLAD --> medical therapy   . Cervical disc disease   . Chronic diastolic congestive heart failure ([place])   . Chronic pain    Secondary to degenerative disc disease and arthritis  . Degenerative arthritis   . Hydronephrosis of right kidney   . Hypertension   . Hypothyroidism   . Hypothyroidism   . Longstanding persistent atrial fibrillation ([place])  originally diagnosed 2007  . NSTEMI (non-ST elevated myocardial infarction) ([place]) 01/10/2017  . Pubic ramus fracture, right, closed, initial encounter ([place]) 02/12/2017  . S/P ascending aortic dissection repair 02/15/2017   Straight graft replacement of sub-acute ascending aortic dissection with hemi-arch distal aortic reconstruction  . 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  Prior Rehab/Hospitalizations:  Has the patient had major surgery during 100  days prior to admission? No  Current Medications   Current Facility-Administered Medications:  .  0.9 %  sodium chloride infusion, 250 mL, Intravenous, Continuous, [name], MD, Last Rate: 1 mL/hr at 02/19/17 1400, 250 mL at 02/19/17 1400 .  0.9 %  sodium chloride infusion, 250 mL, Intravenous, PRN, [name], MD .  aspirin EC tablet 81 mg, 81 mg, Oral, Daily, 81 mg at 02/21/17 0848 **OR** [DISCONTINUED] aspirin chewable tablet 324 mg, 324 mg, Per Tube, Daily, [name], MD .  atorvastatin (LIPITOR) tablet 20 mg, 20 mg, Oral, Daily, [name], MD, 20 mg at 02/21/17 0847 .  bisacodyl (DULCOLAX) EC tablet 10 mg, 10 mg, Oral, Daily, 10 mg at 02/19/17 1007 **OR** bisacodyl (DULCOLAX) suppository 10 mg, 10 mg, Rectal, Daily, [name], MD .  carvedilol (COREG) tablet 3.125 mg, 3.125 mg, Oral, BID WC, Prescott Gum, Collier Salina, MD, 3.125 mg at 02/21/17 0847 .  Chlorhexidine Gluconate Cloth 2 % PADS 6 each, 6 each, Topical, Q0600, [name], MD, 6 each at 02/21/17 [phone] .  digoxin (LANOXIN) tablet 0.125 mg, 0.125 mg, Oral, q morning - 10a, [name], MD, 0.125 mg at 02/21/17 0847 .  docusate sodium (COLACE) capsule 200 mg, 200 mg, Oral, Daily, [name], MD, 200 mg at 02/21/17 0847 .  DULoxetine (CYMBALTA) DR capsule 30 mg, 30 mg, Oral, QHS, [name], MD, 30 mg at 02/20/17 2117 .  DULoxetine (CYMBALTA) DR capsule 60 mg, 60 mg, Oral, Daily, [name], MD, 60 mg at 02/21/17 0847 .  enoxaparin (LOVENOX) injection 30 mg, 30 mg, Subcutaneous, Q24H, Prescott Gum, Collier Salina, MD, 30 mg at 02/20/17 2117 .  feeding supplement (ENSURE ENLIVE) (ENSURE ENLIVE) liquid 237 mL, 237 mL, Oral, TID WC, Prescott Gum, Collier Salina, MD, 237 mL at 02/21/17 0902 .  folic acid-pyridoxine-cyancobalamin (FOLTX) 2.5-25-2 MG per tablet 1 tablet, 1 tablet, Oral, Daily, [name], MD, 1 tablet at 02/21/17 0846 .  furosemide (LASIX) injection 20 mg, 20 mg, Intravenous, BID, Prescott Gum,  Collier Salina, MD, 20 mg at 02/21/17 0846 .  iron polysaccharides (NIFEREX) capsule 150 mg, 150 mg, Oral, Daily, [name], MD, 150 mg at 02/21/17 0846 .  levothyroxine (SYNTHROID, LEVOTHROID) tablet 100 mcg, 100 mcg, Oral, QAC breakfast, [name], MD, 100 mcg at 02/21/17 0604 .  MEDLINE mouth rinse, 15 mL, Mouth Rinse, BID, [name], MD, 15 mL at 02/21/17 1000 .  metoprolol tartrate (LOPRESSOR) injection 2.5-5 mg, 2.5-5 mg, Intravenous, Q2H PRN, [name], MD, 5 mg at 02/19/17 0817 .  morphine 4 MG/ML injection 1-2 mg, 1-2 mg, Intravenous, Q1H PRN, [name], MD, 2 mg at 02/16/17 2327 .  ondansetron (ZOFRAN)  injection 4 mg, 4 mg, Intravenous, Q6H PRN, [name], MD .  pantoprazole (PROTONIX) EC tablet 80 mg, 80 mg, Oral, Daily, [name], MD, 80 mg at 02/21/17 0846 .  potassium chloride (KLOR-CON) packet 40 mEq, 40 mEq, Oral, BID, [name], MD, 40 mEq at 02/21/17 0848 .  RESOURCE THICKENUP CLEAR, , Oral, PRN, Prescott Gum, Peter, MD .  sodium chloride flush (NS) 0.9 % injection 10-40 mL, 10-40 mL, Intracatheter, PRN, [name], MD, 10 mL at 02/21/17 0458 .  sodium chloride flush (NS) 0.9 % injection 3 mL, 3 mL, Intravenous, Q12H, [name], MD, 3 mL at 02/21/17 1000 .  sodium chloride flush (NS) 0.9 % injection 3 mL, 3 mL, Intravenous, PRN, [name], MD .  sodium chloride flush (NS) 0.9 % injection 3 mL, 3 mL, Intravenous, Q12H, [name], MD, 3 mL at 02/20/17 2120 .  sodium chloride flush (NS) 0.9 % injection 3 mL, 3 mL, Intravenous, PRN, [name], MD .  traMADol Veatrice Bourbon) tablet 50-100 mg, 50-100 mg, Oral, Q4H PRN, [name], MD, 50 mg at 02/21/17 1024  Patients Current Diet: DIET DYS 3 Room service appropriate? Yes; Fluid consistency: Thin  Precautions / Restrictions Precautions Precautions: Fall, Sternal Precaution Comments: able to state 1 sternal precautions Restrictions Weight Bearing  Restrictions: No RLE Weight Bearing: Weight bearing as tolerated   Has the patient had 2 or more falls or a fall with injury in the past year?No  Prior Activity Level Community (5-7x/wk): Independent pta without AD. Did not drive; cooked, laundry, etc. Pt did on cooking, cleaning and laundry. Very independent.  Home Assistive Devices / Equipment Home Assistive Devices/Equipment: Environmental consultant (specify type) Home Equipment: Walker - 4 wheels, Shower seat, Hand held shower head, Shower seat - built in, [employer] - tub/shower  Prior Device Use: Indicate devices/aids used by the patient prior to current illness, exacerbation or injury? Walker Only recently began use of RW since last admission to hospital.  Prior Functional Level Prior Function Level of Independence: Independent. Did not drive. Lives in attached home to daughter's home. Daughter manages pillbox system. Daughter also has a caregiver pager if pt needs her assistance in the home. Pt cooked, cleaned, laundry , cared for her daughter's dogs when she was out of town, Social research officer, government.  Self Care: Did the patient need help bathing, dressing, using the toilet or eating?  Independent  Indoor Mobility: Did the patient need assistance with walking from room to room (with or without device)? Independent  Stairs: Did the patient need assistance with internal or external stairs (with or without device)? Independent  Functional Cognition: Did the patient need help planning regular tasks such as shopping or remembering to take medications? Independent  Current Functional Level Cognition  Overall Cognitive Status: Impaired/Different from baseline Orientation Level: Oriented X4 Following Commands: Follows one step commands with increased time Safety/Judgement: Decreased awareness of safety, Decreased awareness of deficits General Comments: pt requiring repetition of instructions     Extremity Assessment (includes Sensation/Coordination)  Upper Extremity  Assessment: Generalized weakness (longstanding shoulder limitations due to arthritis)  Lower Extremity Assessment: Defer to PT evaluation RLE Deficits / Details: AAROM WFL, strength limited due to pain with lifting from hip, also educated to limit hip abduction to avoid increased pain LLE Deficits / Details: AROM WFL, strength hip flexion 4-/5, knee extension 4+/5    ADLs  Overall ADL's : Needs assistance/impaired Eating/Feeding: Set up, Sitting Grooming: Minimal assistance, Sitting,  Wash/dry hands, Wash/dry Arts administrator Bathing: Moderate assistance, Sitting Lower Body Bathing: Total assistance, Sit to/from stand Upper Body Dressing : Minimal assistance, Sitting Lower Body Dressing: Total assistance, Sit to/from stand Toilet Transfer: Minimal assistance, Stand-pivot, BSC Toileting- Clothing Manipulation and Hygiene: Moderate assistance, Sit to/from stand    Mobility  Overal bed mobility: Needs Assistance Bed Mobility: Supine to Sit Supine to sit: HOB elevated, Min assist Sit to supine: Mod assist General bed mobility comments: pt in chair on arrival    Transfers  Overall transfer level: Needs assistance Equipment used: 1 person hand held assist, Rolling walker (2 wheeled) Transfers: Sit to/from Stand, Stand Pivot Transfers Sit to Stand: Min assist Stand pivot transfers: Min assist General transfer comment: cues and assist to scoot to edge of chair, cues for hands on knees, used momentum to stand from [facility] and chair    Ambulation / Gait / Stairs / Wheelchair Mobility  Ambulation/Gait Ambulation/Gait assistance: Min assist, +2 safety/equipment Ambulation Distance (Feet): 35 Feet Assistive device: Rolling walker (2 wheeled) Gait Pattern/deviations: Step-through pattern, Decreased stride length, Trunk flexed General Gait Details: cues for posture and position in RW with chair to follow. Pt walked 35' and 30' with seated rest and chair follow Gait velocity: slow Gait velocity  interpretation: Below normal speed for age/gender    Posture / Balance Dynamic Sitting Balance Sitting balance - Comments: close min guard to sit EOB Balance Overall balance assessment: Needs assistance Sitting-balance support: Feet supported Sitting balance-Leahy Scale: Fair Sitting balance - Comments: close min guard to sit EOB Standing balance support: Bilateral upper extremity supported Standing balance-Leahy Scale: Poor Standing balance comment: reliant on external supports    Special needs/care consideration BiPAP/CPAP  N/a CPM  N/a Continuous Drip IV  N/a Dialysis  N/a Life Vest  N/a Oxygen O2 at 2 liters Reserve. Did not use oxygen pta Special Bed  N/a Trach Size  N/a Wound Vac n/a Skin   Registered Nurse Signed WOC  Consult Note Date of Service: 02/19/2017 4:07 PM      [] Hide copied text [] Hover for attribution information [facility] Nurse wound consult note Reason for Consult: Patient is seen at the outpatient wound care center at [facility].  Lesions at the left great toe, lateral aspect and right foot, distal 2nd, 3rd and 4th digits are dry and stable. Wound type: arterial insufficiency Pressure Injury POA: No Measurement: Left great toe, lateral aspect: 0.8cm x 0.4cm with no depth. Dried serum. Right foot: second, third and 4th digits, distal tips with dry eschars, the largest measures 0.4cm x 0.4cm round. Wound bed:dry, stable eschar present Drainage (amount, consistency, odor): None. Periwound: Inact, dry.  Right foot, 3rd digit is pale purple.  Patient and her daughter state that this is not a new finding. Dressing procedure/placement/frequency: I have provided Nursing with guidance for daily cleansing of the bilateral LEs and for light application of Eucerin cream-except to the intra digital areas.  We will pain the distal lesions at the tips of the left toes with a betadine swabstick and allow to air-dry to keep infection free and the escharotic lesions stable. Patient  should continue to follow at the outpatient [facility]  post discharge as indicated by the CVTS team. [place] nursing team will not follow, but will remain available to this patient, the nursing and medical teams.  Please re-consult if needed. Thanks, Maudie Flakes, MSN, RN, [place], Arther Abbott  Pager# [phone]     JP drain to left leg. Ecchymosis to  face, shoulder and extremities. Feet with cracking skin bilaterally, surgical incisions. Bowel mgmt: LBM 02/20/17. continent Bladder mgmt: indwelling catheter removed 02/20/17. Now incontinent with urgency Diabetic mgmt  N/a Daughter reports cognition not at baseline due to Tramadol and hospitalization   Previous Home Environment Living Arrangements:  (lives with inlaw suite of daughter's hom esince 2005. 3 bedr)  Lives With: Alone (in law suite to daughter's home) Available Help at Discharge: Family, Available 24 hours/day (daughter works from her home) Type of Home: [place]: One level Home Access: Level entry Bathroom Shower/Tub: Multimedia programmer: Handicapped height Bathroom Accessibility: Yes [place]: No Additional Comments: pt's and daughter's homes are joined by a long sunrrom  Daughter reports their two homes are attached by sunroom. Separate homes. Daughter works from home and has a Manufacturing engineer to come assist if needed. Has never needed it.  Discharge Living Setting Plans for Discharge Living Setting: Patient's home (in law suite) Type of Home at Discharge: House Discharge Home Layout: One level Discharge Home Access: Level entry Discharge Bathroom Shower/Tub: Walk-in shower Discharge Bathroom Toilet: Standard Discharge Bathroom Accessibility: Yes How Accessible: Accessible via walker Does the patient have any problems obtaining your medications?: No  Social/Family/Support Systems Patient Roles: Parent Contact Information: Otila Kluver, daughter Anticipated Caregiver: tina,  daughter Anticipated Caregiver's Contact Information: see above Ability/Limitations of Caregiver: Otila Kluver works from home a a Tree surgeon Availability: 24/7 Discharge Plan Discussed with Primary Caregiver: Yes Is Caregiver In Agreement with Plan?: Yes Does Caregiver/Family have Issues with Lodging/Transportation while Pt is in Rehab?: No  Goals/Additional Needs Patient/Family Goal for Rehab: MOd I to superivison with PT, OT, and SLP Expected length of stay: ELOS 12-16 days Pt/Family Agrees to Admission and willing to participate: Yes Program Orientation Provided & Reviewed with Pt/Caregiver Including Roles  & Responsibilities: Yes  Decrease burden of Care through IP rehab admission: n/a  Possible need for SNF placement upon discharge:not anticipated  Patient Condition: This patient's condition remains as documented in the consult dated 02/20/2017, in which the Rehabilitation Physician determined and documented that the patient's condition is appropriate for intensive rehabilitative care in an inpatient rehabilitation facility. Will admit to inpatient rehab today.  Preadmission Screen Completed By:  Cleatrice Burke, 02/21/2017 12:04 PM ______________________________________________________________________   Discussed status with Dr. Naaman Plummer on 02/21/2017 at  68 and received telephone approval for admission today.  Admission Coordinator:  Cleatrice Burke, time 1200 Date 02/21/2017

## 2017-02-21 NOTE — Care Management Important Message (Signed)
Important Message  Patient Details  Name: Lydia Jordan MRN: [id number] Date of Birth: Jun 30, 1934   Medicare Important Message Given:  Yes    Nathen May 02/21/2017, 10:51 AM

## 2017-02-21 NOTE — Progress Notes (Signed)
Patient ID: Lydia Jordan, female   DOB: 03/25/1934, 83 y.o.   MRN: [id number] Patient admitted to 4M02 via bed, escorted by nursing staff and family.  Patient and family oriented to unit, verbalized understanding of rehab process.  Patient very drowsy falling asleep mid sentence.  Daughter states this is new just today and that she is usually more alert than this.  Algis Liming, PA aware of this change in LOC and is evaluating patient.  VSS.  Assessed left thigh wound with JP drain with Algis Liming, PA present.  Removed IV since patient has PICC established.  Incisions to abdomen assessed.  Dressing intact where chest tube sutures were removed today with minimal serosanguinous drainage.  Patient appears to be in no immediate distress at this time.  Brita Romp, RN

## 2017-02-21 NOTE — Discharge Summary (Signed)
Physician Discharge Summary  Patient ID: Lydia Jordan MRN: [id number] DOB/AGE: [birth year] 83 y.o.  Admit date: 02/11/2017 Discharge date: 02/21/2017  Admission Diagnoses:  Patient Active Problem List   Diagnosis Date Noted  . Pubic ramus fracture, right, closed, initial encounter ([place]) 02/12/2017  . Chronic pain   . Cervical disc disease   . Degenerative arthritis   . Physical deconditioning   . Fall   . Closed fracture of multiple pubic rami ([place])   . Anemia   . Hydronephrosis of right kidney   . Ascending aortic dissection ([place]) 02/11/2017  . Pericardial effusion 02/11/2017  . S/P cardiac cath: (2018) a. mild to moderate disease but no flow limiting lesions with perserved EF. b. large-caliber almost ectatic vessels for patients age. 01/12/2017  . Hypertension 01/12/2017  . CAD (coronary artery disease): mild to moderate non-obstructive 01/12/2017  . Epistaxis: (2018) cauterized by Dr. Erik Obey 02/58/5277  . Longstanding persistent atrial fibrillation ([place])   . Ascending aortic aneurysm ([place]) 01/11/2017  . NSTEMI (non-ST elevated myocardial infarction) ([place]) 01/10/2017   Discharge Diagnoses:   Patient Active Problem List   Diagnosis Date Noted  . Acute on chronic diastolic heart failure ([place])   . Chronic diastolic congestive heart failure ([place])   . S/P AVR   . Dysphagia   . Tachypnea   . Hypokalemia   . Leukocytosis   . S/P ascending aortic dissection repair 02/15/2017  . 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 S/P CABG x 1 02/15/2017  . Pubic ramus fracture, right, closed, initial encounter ([place]) 02/12/2017  . Chronic pain   . Cervical disc disease   . Degenerative arthritis   . Physical deconditioning   . Fall   . Closed fracture of multiple pubic rami ([place])   . Anemia   . Hydronephrosis of right kidney   . Ascending aortic dissection ([place]) 02/11/2017  . Pericardial effusion 02/11/2017  . S/P cardiac cath: (2018) a. mild  to moderate disease but no flow limiting lesions with perserved EF. b. large-caliber almost ectatic vessels for patients age. 01/12/2017  . Hypertension 01/12/2017  . CAD (coronary artery disease): mild to moderate non-obstructive 01/12/2017  . Epistaxis: (2018) cauterized by Dr. Erik Obey 82/42/3536  . Longstanding persistent atrial fibrillation ([place])   . Ascending aortic aneurysm ([place]) 01/11/2017  . NSTEMI (non-ST elevated myocardial infarction) ([place]) 01/10/2017   Discharged Condition: good  History of Present Illness:  Lydia Jordan is an 83 year old female with history of hypertension, long-standing persistent atrial fibrillation on anticoagulation, severe degenerative arthritis and cervical disc disease with chronic pain who originally presented approximately one month ago with a prolonged episode of substernal chest pain in the setting of hypertension and epistaxis. Eliquis was held and epistaxis resolved.  She ruled in for an acute non-ST segment elevation myocardial infarction by serial cardiac enzymes.  Transthoracic echocardiogram revealed normal left ventricular systolic function with ejection fraction estimated 60-65%. There was moderate focal basal hypertrophy of the septum. The aortic valve was trileaflet with mild aortic insufficiency. There was severe left atrial enlargement with trivial mitral regurgitation. There was an aneurysm of the proximal ascending thoracic aorta measuring 5 cm in diameter. The patient underwent diagnostic cardiac catheterization revealing mild to moderate nonobstructive coronary artery disease with 50% stenosis in the left anterior descending coronary artery and 50% stenosis in the right coronary artery. Eliquis was resumed and the patient discharged home.  3 days ago the patient stumbled and fell while she was walking in  the parking lot of her podiatrist office. She immediately developed pain in her right hip which has persisted. She hit her head and suffered  multiple bruises. She ultimately presented to the emergency department where she underwent CT scan of the head, neck, and hip revealing nondisplaced fracture of the right pubic ramus. The aorta appeared abnormal on CT scan of the neck, prompting CT scan of the chest with IV contrast which revealed type A aortic dissection. Cardiothoracic surgical consultation was requested.  Hospital Course:   She was evaluated by Dr. Roxy Manns at which time he explained extensively the nature of Aortic Dissection and patient's treatment options.  The patient initially did wish to proceed with emergent surgery.  She was also on chronic Eliquis which would need to reversed prior to proceeding with surgery. It was recommended the patient be admitted to the ICU for BP control and monitoring.  The patient wished to remain a DNR at that time.  Echocardiogram was obtained and showed the presence of the Aortic Dissection.  This was present on prior exam.  Her situation was again discussed with the patient who wished to proceed with elective surgical intervention once her Eliquis had been reversed.  The risks and benefits of the procedure were explained to the patient and she was agreeable to proceed.  Orthopedic consult was obtained for her right hip pain.  It was felt her Right Rami fracture would not require surgical intervention and she would need to follow up with Dr. Percell Miller in 3-4 weeks time.  The patient remained medically stable.  She was taken to the operating room on 5/16.  She underwent CABG x 1 with SVG to RCA with open harvest of saphenous vein from left thigh, Clipping of LA Appendage, Drainage if Pericardial Effusion, Biologic Bentall procedure, and Repair of Ascending thoracic aortic dissection.  She tolerated the procedure and was taken to the SICU in stable condition.  During her stay in the SICU the patient was weaned and extubated on POD #1.  She was weaned off vasopressin and levophed on POD #2.  She had low grade  fever and leukocytosis which was attributed to steroids and circulatory arrest from her surgical procedure.  She developed coughing with oral intake.  SLP consult was placed and patient was found to have a moderate risk of aspiration.  She was transitioned to a dysphagia 1 diet.  However she continued to have difficulty with swallowing.  Due to this a modified barium swallow was obtained.  She was placed on a full liquid diet.  Her chest tubes were removed on POD #4.  She was weaned off Milrinone on POD #5.  The patient has a long standing history of Atrial Fibrillation and her pacing wires were removed without difficulty.  She was felt medically stable for transfer to the telemetry unit in stable condition.  The patient continues to make progress.  She has been followed closely by SLP and she is currently on a dysphagia 3 diet.  She continues to be volume overloaded and is responding well to diuretics.  She remains on oxygen which can be weaned off as tolerated.  She is deconditioned, but is actively participating with PT/OT.  It was recommended the patient would benefit from placement at CIR.  This has been arranged and approved by her insurance company.  She is medically stable for discharge to inpatient rehab today.            Consults: cardiology and orthopedic surgery  Significant Diagnostic  Studies: radiology:   CT scan:   1. 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 The dissection extends along the ascending thoracic aorta, aortic arch and proximal descending thoracic aorta, with partial thrombosis of the false lumen. Apparent spontaneous fenestration noted at the level of the proximal aortic arch. 2. Moderate pericardial effusion demonstrates areas of mildly increased attenuation, suspicious for hemopericardium. Trace left-sided pleural effusion also demonstrates mildly  increased attenuation, concerning for trace left-sided hemothorax. This likely reflects extension of hemorrhage across the aortic adventitia to the pericardial space and left pleural space. No definite acute contrast extravasation seen at this time. 3. The dissection appears to resolve at the proximal descending thoracic aorta, and the distal descending thoracic aorta and abdominal aorta are unremarkable in appearance, aside from scattered aortic atherosclerosis. This may reflect lower pressure than usual due to the combination of spontaneous fenestration and extravasation into the patient's apparent moderate hemopericardium. The great vessels arise from the true lumen. 4. Minimal bilateral atelectasis noted. 5. Degenerative change of the right glenohumeral joint, with scattered loose bodies and underlying diffuse joint space calcification.  Treatments: surgery:    Repair of Ascending Thoracic Aortic Dissection             Right Axillary and Right Common Femoral Artery Cannulation             Deep Hypothermia and total circulatory arrest with continuous retrograde cerebral perfusion             Hemashield Platinum woven double velour straight graft replacement of ascending thoracic aortic aneurysm (size 28 mm)             Hemi-arch distal anastamosis   Biological Bentall Aortic Root Replacement             Edwards Magna Ease Pericardial Tissue Valve (size 21 mm, model # 3300TFX, serial # [id number])             Gelweave Valsalva woven double velour aortic root graft (size 24 mm, ref# [id number], serial #[id number])             Reimplantation of left main coronary artery   Coronary Artery Bypass Grafting x1             Reversed Greater Saphenous Vein Graft to Distal Right Coronary Artery             Open Saphenous Vein Harvest via Left Thigh   Clipping of Left Atrial Appendage             Atricure [id number] left atrial clip (size 2mm)   Drainage of Pericardial Effusion                Disposition: CIR  Discharge Medications:  The patient has been discharged on:   1.Beta Blocker:  Yes [ x  ]                              No   [   ]                              If No, reason:  2.Ace Inhibitor/ARB: Yes [   ]  No  [ x   ]                                     If No, reason: labile BP  3.Statin:   Yes [ x  ]                  No  [   ]                  If No, reason:  4.Ecasa:  Yes  [ x  ]                  No   [   ]                  If No, reason:     Discharge Instructions    Amb Referral to Cardiac Rehabilitation    Complete by:  As directed    Diagnosis:   CABG Valve Replacement     Valve:  Aortic Comment - c/ bentall aortic root replacement    CABG X ___:  1     Allergies as of 02/21/2017      Reactions   Demerol [meperidine] Anaphylaxis   Reaction: "Heart stopped"      Medication List    STOP taking these medications   diclofenac 50 MG EC tablet Commonly known as:  VOLTAREN   HYDROcodone-acetaminophen 5-325 MG tablet Commonly known as:  NORCO/VICODIN   nebivolol 5 MG tablet Commonly known as:  BYSTOLIC   nitroGLYCERIN 0.4 MG SL tablet Commonly known as:  NITROSTAT   SALONPAS PAIN RELIEF PATCH EX     TAKE these medications   apixaban 2.5 MG Tabs tablet Commonly known as:  ELIQUIS Take 1 tablet (2.5 mg total) by mouth 2 (two) times daily. What changed:  medication strength  how much to take   aspirin 81 MG EC tablet Take 1 tablet (81 mg total) by mouth daily. Start taking on:  02/22/2017   atorvastatin 20 MG tablet Commonly known as:  LIPITOR Take 1 tablet (20 mg total) by mouth daily.   carvedilol 3.125 MG tablet Commonly known as:  COREG Take 1 tablet (3.125 mg total) by mouth 2 (two) times daily with a meal.   digoxin 0.125 MG tablet Commonly known as:  LANOXIN Take 0.125 mg by mouth every morning.   DULoxetine 30 MG capsule Commonly known as:  CYMBALTA Take 30 mg by  mouth at bedtime.   DULoxetine 60 MG capsule Commonly known as:  CYMBALTA Take 60 mg by mouth daily.   furosemide 40 MG tablet Commonly known as:  LASIX Take 1 tablet (40 mg total) by mouth 2 (two) times daily.   omeprazole 40 MG capsule Commonly known as:  PRILOSEC Take 40 mg by mouth at bedtime.   potassium chloride 20 MEQ packet Commonly known as:  KLOR-CON Take 40 mEq by mouth 2 (two) times daily.   SYNTHROID 100 MCG tablet Generic drug:  levothyroxine Take 100 mcg by mouth daily before breakfast.   traMADol 50 MG tablet Commonly known as:  ULTRAM Take 1-2 tablets (50-100 mg total) by mouth every 4 (four) hours as needed for moderate pain.      Follow-up Information    [name], MD Follow up on 03/27/2017.   Specialty:  Cardiothoracic Surgery Why:  Appointment is at 4:30, please get CXR  at 4:00 at [facility] located on first floor of office building Contact information: [place] [address] [address], [place], [place] Follow up on 03/07/2017.   Specialty:  Cardiology Why:  Appointment is at 9:30 Contact information: [place] [address] [phone]           Signed: Ellwood Handler 02/21/2017, 2:14 PM

## 2017-02-21 NOTE — H&P (Signed)
Physical Medicine and Rehabilitation Admission H&P    Chief Complaint  Patient presents with  . Debility due to recent surgery and pelvic fracture    HPI: Lydia Jordan is a 83 y.o. female with history of A fib, recent NSTEMI, non-obstructive CAD, ascending aortic aneurysm, achalasia, DJD/DDD with chronic pain, multiple recent falls who presented to ED 02/11/17 with reports of recent fall with hip pain and inability to walk. X rays  revealed right pubic rami fracture, chronic right UPJ obstruction with moderate to severe hydronephrosis as well as type A aortic dissection. She was evaluated by Dr. Roxy Manns and was cleared to undergo repair of aortic dissection with AVR, drainage of pericardial fluid and CABG X 1 on 02/15/17. to be WBAT per Dr. Fredonia Highland. Post op on milironone for fluid overload and on low dose Lovenox for A fib. She has had reports of dysphagia and MBS done functional swallow with high penetration of thin liquids occasionally and modified diet recommended by ST.  She was advanced to dysphagia 3 per patient request. CXR reviewed 5/21, stable changes. Therapy ongoing and patient limited by SOB and fatigue. Patient with significant deficits in mobility and ability to carry out ADL tasks. CIR recommended for follow up therapy. Independent PTA--has started using AD recently due to multiple falls. Sister and daughter supportive and can assist as needed after discharge.    Review of Systems  Unable to perform ROS: Mental acuity  HENT: Positive for hearing loss.   Respiratory: Negative for cough.   Cardiovascular: Negative for chest pain.  Musculoskeletal: Negative for myalgias.      Past Medical History:  Diagnosis Date  . Acid reflux   . Arthritis   . Ascending aortic aneurysm ([place]) 01/11/2017  . Ascending aortic dissection ([place]) 02/11/2017  . CAD (coronary artery disease)    Cath 01/11/17 showed 50% ost RCA and 50% mLAD --> medical therapy   . Cervical disc disease   .  Chronic diastolic congestive heart failure ([place])   . Chronic pain    Secondary to degenerative disc disease and arthritis  . Degenerative arthritis   . Hydronephrosis of right kidney   . Hypertension   . Hypothyroidism   . Hypothyroidism   . Longstanding persistent atrial fibrillation ([place])    originally diagnosed 2007  . NSTEMI (non-ST elevated myocardial infarction) ([place]) 01/10/2017  . Pubic ramus fracture, right, closed, initial encounter ([place]) 02/12/2017  . S/P ascending aortic dissection repair 02/15/2017   Straight graft replacement of sub-acute ascending aortic dissection with hemi-arch distal aortic reconstruction  . 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 AORTIC VALVE REPLACEMENT N/A 02/15/2017   Procedure: AORTIC VALVE REPLACEMENT (AVR);  Surgeon: [name], MD;  Location: [place];  Service: Open Heart Surgery;  Laterality: N/A;  . BENTALL PROCEDURE N/A 02/15/2017   Procedure: BENTALL AORTIC ROOT REPLACEMENT PROCEDURE, REPAIR ASCENDING AORTIC DISSECTION;  Surgeon: [name], MD;  Location: [place];  Service: Open Heart Surgery;  Laterality: N/A;  . CLIPPING OF ATRIAL APPENDAGE Left 02/15/2017   Procedure: CLIPPING OF ATRIAL APPENDAGE;  Surgeon: [name], MD;  Location: [place];  Service: Open Heart Surgery;  Laterality: Left;  . CORONARY ARTERY BYPASS  GRAFT N/A 02/15/2017   Procedure: CORONARY ARTERY BYPASS GRAFTING (CABG) x  one, using left saphenous vein;  Surgeon: [name], MD;  Location: [facility];  Service: Vascular;  Laterality: N/A;  . ESOPHAGEAL MANOMETRY N/A 07/31/2015   Procedure: ESOPHAGEAL MANOMETRY (EM);  Surgeon: Arta Silence, MD;  [facility];  Service:  Endoscopy;  Laterality: N/A;  . ESOPHAGOGASTRODUODENOSCOPY (EGD) WITH PROPOFOL N/A 09/02/2015   Procedure: ESOPHAGOGASTRODUODENOSCOPY (EGD) WITH PROPOFOL;  Surgeon: Arta Silence, MD;  [facility];  Service: Endoscopy;  Laterality: N/A;  botulinum toxin injection (100 Units; 25 units in 4 quadrants 1-2 cm proximal to GE junction  . LEFT HEART CATH AND CORONARY ANGIOGRAPHY N/A 01/11/2017   Procedure: Left Heart Cath and Coronary Angiography;  Surgeon: Leonie Man, MD;  Location: [place] [facility];  Service: Cardiovascular;  Laterality: N/A;  . PERICARDIAL FLUID DRAINAGE  02/15/2017   Procedure: DRAINAGE OF PERICARDIAL FLUID;  Surgeon: [name], MD;  Location: [place];  Service: Vascular;;  . skin cancer area removed      right leg healing well  . TEE WITHOUT CARDIOVERSION N/A 02/15/2017   Procedure: TRANSESOPHAGEAL ECHOCARDIOGRAM (TEE);  Surgeon: [name], MD;  Location: [place];  Service: Open Heart Surgery;  Laterality: N/A;  . VAGINAL HYSTERECTOMY      Family History  Problem Relation Age of Onset  . Hypertension Mother   . Anuerysm Mother        died in her 31's  . Arthritis Father        died at age 23  . Heart attack Brother        died in 68's    Social History:  Divorced.  Lives in an in-law apartment next to daughter. Retired. Independent but limited by severe arthritis.  She  reports that she has quit smoking. She has never used smokeless tobacco. She reports that she does not drink alcohol or use drugs.    Allergies  Allergen Reactions  . Demerol [Meperidine] Anaphylaxis    Reaction: "Heart stopped"    Medications Prior to Admission  Medication Sig Dispense Refill  . apixaban (ELIQUIS) 5 MG TABS tablet Take 1 tablet (5 mg total) by mouth 2 (two) times daily.    Marland Kitchen atorvastatin (LIPITOR) 20 MG tablet Take 1 tablet (20 mg total) by mouth daily. 30 tablet 6  . diclofenac (VOLTAREN) 50 MG EC tablet Take 1 tablet (50 mg total) by mouth 2 (two)  times daily. 180 tablet 3  . digoxin (LANOXIN) 0.125 MG tablet Take 0.125 mg by mouth every morning.    . DULoxetine (CYMBALTA) 30 MG capsule Take 30 mg by mouth at bedtime.     . DULoxetine (CYMBALTA) 60 MG capsule Take 60 mg by mouth daily.  1  . HYDROcodone-acetaminophen (NORCO/VICODIN) 5-325 MG tablet Take 1 tablet by mouth 2 (two) times daily. Pain    . Liniments (SALONPAS PAIN RELIEF PATCH EX) Apply 1 patch topically daily as needed. For muscle pain    . nebivolol (BYSTOLIC) 5 MG tablet Take 5 mg by mouth every morning.    . nitroGLYCERIN (NITROSTAT) 0.4 MG SL tablet Place 1 tablet (0.4 mg total) under the tongue every 5 (five) minutes x 3 doses as needed for chest pain. 12 tablet 2  . omeprazole (PRILOSEC) 40 MG capsule Take 40 mg by mouth at bedtime.    Marland Kitchen SYNTHROID 100 MCG tablet Take 100 mcg by mouth daily before breakfast.  11  Home: Home Living Family/patient expects to be discharged to:: Private residence Living Arrangements: Children Available Help at Discharge: Family, Available 24 hours/day (daughter is self employed) Type of Home: House Home Access: Level entry Home Layout: One level Bathroom Shower/Tub: Multimedia programmer: Handicapped height Bathroom Accessibility: Yes Home Equipment: Environmental consultant - 4 wheels, Shower seat, Hand held shower head, Shower seat - built in, [employer] - tub/shower   Functional History: Prior Function Level of Independence: Independent  Functional Status:  Mobility: Bed Mobility Overal bed mobility: Needs Assistance Bed Mobility: Supine to Sit Supine to sit: HOB elevated, Min assist Sit to supine: Mod assist General bed mobility comments: pt in chair on arrival Transfers Overall transfer level: Needs assistance Equipment used: 1 person hand held assist, Rolling walker (2 wheeled) Transfers: Sit to/from Stand, Stand Pivot Transfers Sit to Stand: Min assist Stand pivot transfers: Min assist General transfer comment: cues and  assist to scoot to edge of chair, cues for hands on knees, used momentum to stand from [facility] and chair Ambulation/Gait Ambulation/Gait assistance: Min assist, +2 safety/equipment Ambulation Distance (Feet): 35 Feet Assistive device: Rolling walker (2 wheeled) Gait Pattern/deviations: Step-through pattern, Decreased stride length, Trunk flexed General Gait Details: cues for posture and position in RW with chair to follow. Pt walked 35' and 30' with seated rest and chair follow Gait velocity: slow Gait velocity interpretation: Below normal speed for age/gender    ADL: ADL Overall ADL's : Needs assistance/impaired Eating/Feeding: Set up, Sitting Grooming: Minimal assistance, Sitting, Wash/dry hands, Wash/dry face Upper Body Bathing: Moderate assistance, Sitting Lower Body Bathing: Total assistance, Sit to/from stand Upper Body Dressing : Minimal assistance, Sitting Lower Body Dressing: Total assistance, Sit to/from stand Toilet Transfer: Minimal assistance, Stand-pivot, BSC Toileting- Clothing Manipulation and Hygiene: Moderate assistance, Sit to/from stand  Cognition: Cognition Overall Cognitive Status: Impaired/Different from baseline Orientation Level: Oriented X4 Cognition Arousal/Alertness: Awake/alert Behavior During Therapy: Flat affect Overall Cognitive Status: Impaired/Different from baseline Area of Impairment: Memory, Following commands, Safety/judgement, Problem solving Memory: Decreased short-term memory, Decreased recall of precautions Following Commands: Follows one step commands with increased time Safety/Judgement: Decreased awareness of safety, Decreased awareness of deficits Problem Solving: Slow processing, Decreased initiation, Difficulty sequencing, Requires verbal cues, Requires tactile cues General Comments: pt requiring repetition of instructions    Blood pressure 120/79, pulse 73, temperature 98.2 F (36.8 C), temperature source Oral, resp. rate 18, height  5' 3"  (1.6 m), weight 66.6 kg (146 lb 12.8 oz), SpO2 (!) 67 %. Physical Exam  Nursing note and vitals reviewed. Constitutional: She appears well-developed and well-nourished. She appears lethargic. She is easily aroused.  HENT:  Head: Normocephalic and atraumatic.  Mouth/Throat: Oropharynx is clear and moist.  Resolving ecchymosis right forehead and periorbital area. Small hematoma right forehead along hairline.   Eyes: Conjunctivae and EOM are normal. Pupils are equal, round, and reactive to light.  Neck: Normal range of motion. Neck supple.  Cardiovascular: Normal rate.  An irregularly irregular rhythm present.  Murmur heard. Respiratory: Effort normal. No stridor. No respiratory distress. She has decreased breath sounds. She has wheezes. She has no rales.  Audible upper airway wheezing.   GI: Soft. Bowel sounds are normal. She exhibits no distension. There is no tenderness.  Musculoskeletal: She exhibits edema. She exhibits no tenderness.  JP drain left mid thigh incision. Bilateral feet cool to touch with erythema along edges--foot fungus. Callus on plantar surfaces on right.  Onychomycotic toe nails.   Neurological: She is easily aroused. She appears lethargic.  Lethargic and confused. Oriented to self and place. Had difficulty answering basic biographic questions with word finding difficulty.  Distracted, had difficulty following simple motor commands with perseverative answers.  Moved all 4 limbs automatically but struggled with purposeful movement. Sensed pain in all 4 limbs.   Skin: Skin is warm and dry.  Numerous bruises,ecchymoses over face and upper extremities.   Psychiatric: Her speech is delayed and tangential. She is slowed and withdrawn. Cognition and memory are impaired. She expresses inappropriate judgment.  Confused and distracted    Results for orders placed or performed during the hospital encounter of 02/11/17 (from the past 48 hour(s))  I-STAT, chem 8     Status:  Abnormal   Collection Time: 02/19/17  3:59 PM  Result Value Ref Range   Sodium 134 (L) 135 - 145 mmol/L   Potassium 3.4 (L) 3.5 - 5.1 mmol/L   Chloride 92 (L) 101 - 111 mmol/L   BUN 19 6 - 20 mg/dL   Creatinine, Ser 0.70 0.44 - 1.00 mg/dL   Glucose, Bld 118 (H) 65 - 99 mg/dL   Calcium, Ion 1.13 (L) 1.15 - 1.40 mmol/L   TCO2 30 0 - 100 mmol/L   Hemoglobin 10.2 (L) 12.0 - 15.0 g/dL   HCT 30.0 (L) 36.0 - 46.0 %  Glucose, capillary     Status: None   Collection Time: 02/19/17  4:26 PM  Result Value Ref Range   Glucose-Capillary 99 65 - 99 mg/dL   Comment 1 Capillary Specimen    Comment 2 Notify RN   Glucose, capillary     Status: Abnormal   Collection Time: 02/19/17  9:20 PM  Result Value Ref Range   Glucose-Capillary 101 (H) 65 - 99 mg/dL   Comment 1 Capillary Specimen    Comment 2 Notify RN    Comment 3 Document in Chart   .Cooxemetry Panel (carboxy, met, total hgb, O2 sat)     Status: Abnormal   Collection Time: 02/20/17  4:35 AM  Result Value Ref Range   Total hemoglobin 10.1 (L) 12.0 - 16.0 g/dL   O2 Saturation 59.1 %   Carboxyhemoglobin 1.6 (H) 0.5 - 1.5 %   Methemoglobin 1.2 0.0 - 1.5 %  CBC     Status: Abnormal   Collection Time: 02/20/17  4:40 AM  Result Value Ref Range   WBC 14.8 (H) 4.0 - 10.5 K/uL   RBC 3.46 (L) 3.87 - 5.11 MIL/uL   Hemoglobin 10.0 (L) 12.0 - 15.0 g/dL   HCT 31.1 (L) 36.0 - 46.0 %   MCV 89.9 78.0 - 100.0 fL   MCH 28.9 26.0 - 34.0 pg   MCHC 32.2 30.0 - 36.0 g/dL   RDW 14.4 11.5 - 15.5 %   Platelets 232 150 - 400 K/uL  Basic metabolic panel     Status: Abnormal   Collection Time: 02/20/17  4:40 AM  Result Value Ref Range   Sodium 132 (L) 135 - 145 mmol/L   Potassium 3.3 (L) 3.5 - 5.1 mmol/L   Chloride 95 (L) 101 - 111 mmol/L   CO2 30 22 - 32 mmol/L   Glucose, Bld 102 (H) 65 - 99 mg/dL   BUN 14 6 - 20 mg/dL   Creatinine, Ser 0.60 0.44 - 1.00 mg/dL   Calcium 7.7 (L) 8.9 - 10.3 mg/dL   GFR calc non Af Amer >60 >60 mL/min   GFR calc Af Amer  >60 >60 mL/min    Comment: (NOTE) The eGFR has  been calculated using the CKD EPI equation. This calculation has not been validated in all clinical situations. eGFR's persistently <60 mL/min signify possible Chronic Kidney Disease.    Anion gap 7 5 - 15  Glucose, capillary     Status: None   Collection Time: 02/20/17  8:33 AM  Result Value Ref Range   Glucose-Capillary 81 65 - 99 mg/dL   Comment 1 Notify RN   Glucose, capillary     Status: Abnormal   Collection Time: 02/20/17  8:18 PM  Result Value Ref Range   Glucose-Capillary 142 (H) 65 - 99 mg/dL   Comment 1 Notify RN    Comment 2 Document in Chart   CBC     Status: Abnormal   Collection Time: 02/21/17  4:53 AM  Result Value Ref Range   WBC 15.8 (H) 4.0 - 10.5 K/uL   RBC 3.60 (L) 3.87 - 5.11 MIL/uL   Hemoglobin 10.5 (L) 12.0 - 15.0 g/dL   HCT 33.1 (L) 36.0 - 46.0 %   MCV 91.9 78.0 - 100.0 fL   MCH 29.2 26.0 - 34.0 pg   MCHC 31.7 30.0 - 36.0 g/dL   RDW 14.6 11.5 - 15.5 %   Platelets 276 150 - 400 K/uL  Comprehensive metabolic panel     Status: Abnormal   Collection Time: 02/21/17  4:53 AM  Result Value Ref Range   Sodium 133 (L) 135 - 145 mmol/L   Potassium 4.3 3.5 - 5.1 mmol/L    Comment: DELTA CHECK NOTED   Chloride 95 (L) 101 - 111 mmol/L   CO2 32 22 - 32 mmol/L   Glucose, Bld 88 65 - 99 mg/dL   BUN 17 6 - 20 mg/dL   Creatinine, Ser 0.62 0.44 - 1.00 mg/dL   Calcium 8.2 (L) 8.9 - 10.3 mg/dL   Total Protein 5.2 (L) 6.5 - 8.1 g/dL   Albumin 2.3 (L) 3.5 - 5.0 g/dL   AST 22 15 - 41 U/L   ALT 19 14 - 54 U/L   Alkaline Phosphatase 80 38 - 126 U/L   Total Bilirubin 1.2 0.3 - 1.2 mg/dL   GFR calc non Af Amer >60 >60 mL/min   GFR calc Af Amer >60 >60 mL/min    Comment: (NOTE) The eGFR has been calculated using the CKD EPI equation. This calculation has not been validated in all clinical situations. eGFR's persistently <60 mL/min signify possible Chronic Kidney Disease.    Anion gap 6 5 - 15  Prealbumin     Status:  Abnormal   Collection Time: 02/21/17  4:53 AM  Result Value Ref Range   Prealbumin 8.8 (L) 18 - 38 mg/dL   Dg Chest Port 1 View  Result Date: 02/20/2017 CLINICAL DATA:  83 year old female post aortic valve replacement. Subsequent encounter. EXAM: PORTABLE CHEST 1 VIEW COMPARISON:  02/19/2017. FINDINGS: Right PICC line tip projects at the level of the distal superior vena cava/ cavoatrial junction. Right internal jugular catheter has been removed. Post aortic valve replacement and left atrial appendage clipping. Cardiomegaly. Central pulmonary vascular congestion. Perihilar/basilar subsegmental atelectasis. There may be small pleural effusions. No pneumothorax. Calcified tortuous aorta. Bilateral prominent shoulder joint degenerative changes. IMPRESSION: Right internal jugular catheter has been removed. Right PICC catheter tip distal superior vena cava/ cavoatrial junction level. Cardiomegaly post aortic valve replacement and left atrial appendage clipping. Central pulmonary vascular prominence. Subsegmental atelectasis perihilar region lung bases. There may be small bilateral pleural effusions. Electronically Signed   By: Remo Lipps  Jeannine Kitten M.D.   On: 02/20/2017 07:36   Dg Chest Port 1 View  Result Date: 02/19/2017 CLINICAL DATA:  Central line placement.  Initial encounter. EXAM: PORTABLE CHEST 1 VIEW COMPARISON:  Chest radiograph performed earlier today at 4:44 a.m. FINDINGS: The patient's right PICC is noted ending about the mid SVC. A right IJ line is noted ending about the proximal SVC. A small left pleural effusion is suspected. No pneumothorax is seen. There is trace fluid tracking along the right minor fissure. The cardiomediastinal silhouette is mildly enlarged. The patient is status post median sternotomy. An aortic valve replacement is noted. No acute osseous abnormalities are identified. Mild degenerative change is noted at the glenohumeral joints bilaterally, with chronic superior subluxation of  the humeral heads. IMPRESSION: 1. Right PICC noted ending about the mid SVC. 2. Right IJ line noted ending about the proximal SVC. 3. Suspect small left pleural effusion. Trace fluid tracking along the right minor fissure. Mild cardiomegaly. Electronically Signed   By: Garald Balding M.D.   On: 02/19/2017 20:23   Dg Swallowing Func-speech Pathology  Result Date: 02/19/2017 Objective Swallowing Evaluation: Type of Study: MBS-Modified Barium Swallow Study Patient Details Name: Lydia Jordan MRN: [id number] Date of Birth: [birth year] Today's Date: 02/19/2017 Time: SLP Start Time (ACUTE ONLY): 1156-SLP Stop Time (ACUTE ONLY): 1220 SLP Time Calculation (min) (ACUTE ONLY): 24 min Past Medical History: Past Medical History: Diagnosis Date . Acid reflux  . Arthritis  . Ascending aortic aneurysm ([place]) 01/11/2017 . Ascending aortic dissection ([place]) 02/11/2017 . CAD (coronary artery disease)   Cath 01/11/17 showed 50% ost RCA and 50% mLAD --> medical therapy  . Cervical disc disease  . Chronic pain   Secondary to degenerative disc disease and arthritis . Degenerative arthritis  . Hydronephrosis of right kidney  . Hypertension  . Hypothyroidism  . Hypothyroidism  . Longstanding persistent atrial fibrillation ([place])   originally diagnosed 2007 . NSTEMI (non-ST elevated myocardial infarction) ([place]) 01/10/2017 . Pubic ramus fracture, right, closed, initial encounter ([place]) 02/12/2017 . S/P ascending aortic dissection repair 02/15/2017  Straight graft replacement of sub-acute ascending aortic dissection with hemi-arch distal aortic reconstruction . 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 AORTIC VALVE REPLACEMENT N/A 02/15/2017   Procedure: AORTIC VALVE REPLACEMENT (AVR);  Surgeon: [name], MD;  Location: [place];  Service: Open Heart Surgery;  Laterality: N/A; . BENTALL PROCEDURE N/A 02/15/2017  Procedure: BENTALL AORTIC ROOT REPLACEMENT PROCEDURE, REPAIR ASCENDING AORTIC DISSECTION;  Surgeon: [name], MD;  Location: [place];  Service: Open Heart Surgery;  Laterality: N/A; . CLIPPING OF ATRIAL APPENDAGE Left 02/15/2017  Procedure: CLIPPING OF ATRIAL APPENDAGE;  Surgeon: [name], MD;  Location: [place];  Service: Open Heart Surgery;  Laterality: Left; . CORONARY ARTERY BYPASS GRAFT N/A 02/15/2017  Procedure: CORONARY ARTERY BYPASS GRAFTING (CABG) x  one, using left saphenous vein;  Surgeon: [name], MD;  Location: [facility];  Service: Vascular;  Laterality: N/A; . ESOPHAGEAL MANOMETRY N/A 07/31/2015  Procedure: ESOPHAGEAL MANOMETRY (EM);  Surgeon: Arta Silence, MD;  [facility];  Service: Endoscopy;  Laterality: N/A; . ESOPHAGOGASTRODUODENOSCOPY (EGD) WITH PROPOFOL N/A 09/02/2015  Procedure: ESOPHAGOGASTRODUODENOSCOPY (EGD) WITH PROPOFOL;  Surgeon: Arta Silence, MD;  [facility];  Service: Endoscopy;  Laterality: N/A;  botulinum toxin injection (100 Units; 25 units in 4 quadrants 1-2 cm proximal to GE junction . LEFT HEART CATH AND CORONARY ANGIOGRAPHY N/A 01/11/2017  Procedure: Left Heart Cath and Coronary Angiography;  Surgeon: Leonie Man, MD;  Location: [place] [facility];  Service: Cardiovascular;  Laterality: N/A; . PERICARDIAL FLUID DRAINAGE  02/15/2017  Procedure: DRAINAGE OF PERICARDIAL FLUID;  Surgeon: [name], MD;  Location: [place];  Service: Vascular;; . skin cancer area removed     right leg healing well . TEE WITHOUT CARDIOVERSION N/A 02/15/2017  Procedure: TRANSESOPHAGEAL ECHOCARDIOGRAM (TEE);  Surgeon: [name], MD;  Location: [place];  Service: Open Heart Surgery;  Laterality: N/A; . VAGINAL HYSTERECTOMY   HPI: Pt is an 83 y.o. female with PMH of atrial  fibrillation on eliquis, HTN, hypothyroidism, acid reflux, recent NSTEMI, aortic dissection. Pt had a mechanical fall on 5/9, during which time she bruised her face and hip. She had pain after the fall and felt she needed to be seen, therefore presenting 5/12. Given that she is on anticoagulation, she underwent CT of head and pelvis. Initial CT concerning for unclear mass vs. Consolidation at left lung apex, so repeat CT with contrast performed. CT showed type A dissection. Underwent CT surgery on 5/16 and was extubated 5/17. CXR 5/19 showed mild bilateral atelectasis. Bedside swallow eval ordered due to coughing observed with oral intake per MD. Pt currently on dysphagia 1 diet/ nectar thick liquids. barium swallow 06/2015: "Significant tertiary contractions in the mid and distal esophagus; short segment stricture of the distal esophagus just above the small hiatal hernia. The barium pill lodges at the level of the short segment distal esophageal stricture; prominent cricopharyngeus muscle. " Pt reports botox tx to esophagus.   Subjective: alert, communicative Assessment / Plan / Recommendation CHL IP CLINICAL IMPRESSIONS 02/19/2017 Clinical Impression Pt presents with a functional oropharyngeal swallow, marked by high penetration of thin liquids occasionally, not considered disordered in the elderly.  There was adequate mastication; good pharyngeal clearance of POs; no aspiration.  Prominent cricopharyngeus was evident, and esophageal sweep revealed barium stasis filling the esophageal column and reaching cervical level.  Pt acknowledges hx of esophageal deficits.  For now, recommend changing diet to full liquids (thin liquids/straws permitted); pt should sit upright for meals and 45 minutes after.  She may benefit from repeat esophageal w/u when appropriate.  She is at risk for backflow/aspiration of esophageal material.  RN present; discussed results/recommendations.  SLP Visit Diagnosis Dysphagia,  pharyngoesophageal phase (R13.14) Attention and concentration deficit following -- Frontal lobe and executive function deficit following -- Impact on safety and function Mild aspiration risk   CHL IP TREATMENT RECOMMENDATION 02/19/2017 Treatment Recommendations Therapy as outlined in treatment plan below   Prognosis 02/18/2017 Prognosis for Safe Diet Advancement (No Data) Barriers to Reach Goals -- Barriers/Prognosis Comment -- CHL IP DIET RECOMMENDATION 02/19/2017 SLP Diet Recommendations Other (Comment) Liquid Administration via Straw Medication Administration Crushed with puree Compensations Follow solids with liquid Postural Changes Seated upright at 90 degrees   CHL IP OTHER RECOMMENDATIONS 02/19/2017 Recommended Consults Consider esophageal assessment Oral Care Recommendations Oral care BID Other Recommendations --   [facility] IP FOLLOW UP RECOMMENDATIONS 02/19/2017 Follow up Recommendations (No Data)   [facility] IP FREQUENCY AND DURATION 02/19/2017 Speech Therapy Frequency (ACUTE ONLY) min 2x/week Treatment Duration 1 week      [facility] IP ORAL PHASE 02/19/2017 Oral Phase WFL Oral - Pudding Teaspoon -- Oral - Pudding  Cup -- Oral - Honey Teaspoon -- Oral - Honey Cup -- Oral - Nectar Teaspoon -- Oral - Nectar Cup -- Oral - Nectar Straw -- Oral - Thin Teaspoon -- Oral - Thin Cup -- Oral - Thin Straw -- Oral - Puree -- Oral - Mech Soft -- Oral - Regular -- Oral - Multi-Consistency -- Oral - Pill -- Oral Phase - Comment --  [facility] IP PHARYNGEAL PHASE 02/19/2017 Pharyngeal Phase WFL Pharyngeal- Pudding Teaspoon -- Pharyngeal -- Pharyngeal- Pudding Cup -- Pharyngeal -- Pharyngeal- Honey Teaspoon -- Pharyngeal -- Pharyngeal- Honey Cup -- Pharyngeal -- Pharyngeal- Nectar Teaspoon -- Pharyngeal -- Pharyngeal- Nectar Cup -- Pharyngeal -- Pharyngeal- Nectar Straw -- Pharyngeal -- Pharyngeal- Thin Teaspoon -- Pharyngeal -- Pharyngeal- Thin Cup -- Pharyngeal -- Pharyngeal- Thin Straw -- Pharyngeal -- Pharyngeal- Puree -- Pharyngeal -- Pharyngeal-  Mechanical Soft -- Pharyngeal -- Pharyngeal- Regular -- Pharyngeal -- Pharyngeal- Multi-consistency -- Pharyngeal -- Pharyngeal- Pill -- Pharyngeal -- Pharyngeal Comment --  [facility] IP CERVICAL ESOPHAGEAL PHASE 02/19/2017 Cervical Esophageal Phase Impaired Pudding Teaspoon -- Pudding Cup -- Honey Teaspoon -- Honey Cup -- Nectar Teaspoon -- Nectar Cup -- Nectar Straw -- Thin Teaspoon -- Thin Cup -- Thin Straw -- Puree -- Mechanical Soft -- Regular -- Multi-consistency -- Pill -- Cervical Esophageal Comment (No Data) No flowsheet data found. [name] 02/19/2017, 1:11 PM                  Medical Problem List and Plan: 1.  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  -admit to inpatient rehab 2.  DVT Prophylaxis/Anticoagulation: Pharmaceutical: Other (comment)--to resume eliquis today.  3. Chronic pain/Pain Management: Continue cymbalta bid. Will discontinue tramadol due as it likely contributing to confusion.  4. Mood: LCSW to follow for evaluation and support.  5. Neuropsych: This patient is not fully capable of making decisions on her own behalf.  -limit neurosedating medication as possible  -recheck UA and UCX  -pt with recent head trauma (CT negative for acute injury but with substantial atrophy) 6. Skin/Wound Care: Monitor wound daily for healing. Routine pressure relief measures.  7. Fluids/Electrolytes/Nutrition: Monitor I/O. Check lytes in am. Add protein supplement.  8. A fib: Monitor HR bid. Continue ASA, coreg and lanoxin for heart rate control.  10. HTN: Monitor BP bid--poorly controlled at this time. May need to add 12. Congestive heart failure: Monitor weight daily--on upward trend. Continue IV lasix.  13. Hypoxia: Encourage IS. Wean oxygen as tolerated.  14. Right pelvic fracture: WBAT 15. Achalasia/Dysphagia: Full supervision with modified diet for safety. 16. ABLA: Monitor serially.  Continue iron supplement. Recent epistaxis:  17. Persistent leucocytosis: Monitor for signs of infections.  10. Right hydronephrosis: ? Chronic. Renal status stable.     Post Admission Physician Evaluation: 1. Functional deficits secondary  to polytrauma, debiliy. 2. Patient is admitted to receive collaborative, interdisciplinary care between the physiatrist, rehab nursing staff, and therapy team. 3. Patient's level of medical complexity and substantial therapy needs in context of that medical necessity cannot be provided at a lesser intensity of care such as a SNF. 4. Patient has experienced substantial functional loss from his/her baseline which was documented above under the "Functional History" and "Functional Status" headings.  Judging by the patient's diagnosis, physical exam, and functional history, the patient has potential for functional progress which will result in measurable gains while on inpatient rehab.  These gains will be of substantial and practical use  upon discharge  in facilitating mobility and self-care at the household level. 5. Physiatrist will provide 24 hour management of medical needs as well as oversight of the therapy plan/treatment and provide guidance as appropriate regarding the interaction of the two. 6. The Preadmission Screening has been reviewed and patient status is unchanged unless otherwise stated above. 7. 24 hour rehab nursing will assist with bladder management, bowel management, safety, skin/wound care, disease management, medication administration, pain management and patient education  and help integrate therapy concepts, techniques,education, etc. 8. PT will assess and treat for/with: Lower extremity strength, range of motion, stamina, balance, functional mobility, safety, adaptive techniques and equipment, NMR, family education, activity tolerance.   Goals are: mod I to supervision. 9. OT will assess and treat for/with: ADL's, functional mobility, safety,  upper extremity strength, adaptive techniques and equipment, NMR, activity tolerance, family education, community reentry.   Goals are: mod I to supervision. Therapy may proceed with showering this patient. 10. SLP will assess and treat for/with: cognition, communication, family education.  Goals are: mod I to supervision. 11. Case Management and Social Worker will assess and treat for psychological issues and discharge planning. 12. Team conference will be held weekly to assess progress toward goals and to determine barriers to discharge. 13. Patient will receive at least 3 hours of therapy per day at least 5 days per week. 14. ELOS: 13-16 days       15. Prognosis:  excellent     Meredith Staggers, MD, [facility] 02/21/2017  Bary Leriche, Hershal Coria 02/21/2017

## 2017-02-21 NOTE — Progress Notes (Signed)
[name], MD Physician Signed Physical Medicine and Rehabilitation  Consult Note Date of Service: 02/20/2017 2:39 PM  Related encounter: ED to Hosp-Admission (Discharged) from 02/11/2017 in [facility] Collapse All   []Hide copied text []Hover for attribution information      Physical Medicine and Rehabilitation Consult   Reason for Consult: Debility due to recent surgery and pelvic fracture Referring Physician: Dr. Roxy Manns.    HPI: Lydia Jordan is a 83 y.o. female with history of A fib, recent NSTEMI, achalasia, DJD/DDD with chronic pain who sustained a fall 02/08/17 with pain in right hip and decline in mobility.  Evaluation in ED 5/12 revealed right pubic rami fracture, chronic right UPJ obstruction with moderate to severe hydronephrosis as well as type A aortic dissection. She was evaluated by Dr. Roxy Manns and was cleared for repair of aneurysm on 02/15/17. to be WBAT per Dr. Fredonia Highland. Post op on milironone for fluid overload and on low dose Lovenox for A fib. She has had reports of dysphagia and MBS done functional swallow with high penetration of thin liquids occasionally and modified diet recommended by ST. CXR reviewed 5/21, stable changes. Therapy ongoing and patient limited by SOB and fatigue. Patient with significant deficits in mobility and ability to carry out ADL tasks. CIR recommended for follow up therapy. Independent PTA--has started using AD recently due to multiple falls. Sister and daughter supportive and can assist as needed after discharge.    Review of Systems  HENT: Positive for hearing loss.   Eyes: Negative for blurred vision and double vision.  Respiratory: Positive for cough, shortness of breath and wheezing.   Cardiovascular: Positive for chest pain.  Gastrointestinal: Positive for diarrhea. Negative for heartburn and nausea.  Genitourinary: Negative for dysuria and urgency.  Musculoskeletal:  Positive for back pain, falls (2-3 falls recently), myalgias and neck pain.  Skin: Positive for rash.  Neurological: Positive for dizziness and weakness. Negative for speech change, focal weakness and headaches.  Psychiatric/Behavioral: Positive for memory loss. Negative for depression. The patient does not have insomnia.   All other systems reviewed and are negative.         Past Medical History:  Diagnosis Date  . Acid reflux   . Arthritis   . Ascending aortic aneurysm ([place]) 01/11/2017  . Ascending aortic dissection ([place]) 02/11/2017  . CAD (coronary artery disease)    Cath 01/11/17 showed 50% ost RCA and 50% mLAD --> medical therapy   . Cervical disc disease   . Chronic diastolic congestive heart failure ([place])   . Chronic pain    Secondary to degenerative disc disease and arthritis  . Degenerative arthritis   . Hydronephrosis of right kidney   . Hypertension   . Hypothyroidism   . Hypothyroidism   . Longstanding persistent atrial fibrillation ([place])    originally diagnosed 2007  . NSTEMI (non-ST elevated myocardial infarction) ([place]) 01/10/2017  . Pubic ramus fracture, right, closed, initial encounter ([place]) 02/12/2017  . S/P ascending aortic dissection repair 02/15/2017   Straight graft replacement of sub-acute ascending aortic dissection with hemi-arch distal aortic reconstruction  . 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 AORTIC VALVE REPLACEMENT N/A 02/15/2017   Procedure: AORTIC VALVE REPLACEMENT (AVR);  Surgeon: [name], MD;  Location: [place];  Service: Open Heart Surgery;  Laterality: N/A;  . BENTALL PROCEDURE N/A 02/15/2017   Procedure: BENTALL AORTIC  ROOT REPLACEMENT PROCEDURE, REPAIR ASCENDING AORTIC DISSECTION;  Surgeon: [name], MD;  Location: [place];  Service: Open Heart Surgery;  Laterality: N/A;  . CLIPPING OF ATRIAL APPENDAGE Left 02/15/2017   Procedure: CLIPPING OF ATRIAL APPENDAGE;  Surgeon: [name], MD;  Location: [place];  Service: Open Heart Surgery;  Laterality: Left;  . CORONARY ARTERY BYPASS GRAFT N/A 02/15/2017   Procedure: CORONARY ARTERY BYPASS GRAFTING (CABG) x  one, using left saphenous vein;  Surgeon: [name], MD;  Location: [facility];  Service: Vascular;  Laterality: N/A;  . ESOPHAGEAL MANOMETRY N/A 07/31/2015   Procedure: ESOPHAGEAL MANOMETRY (EM);  Surgeon: Arta Silence, MD;  [facility];  Service: Endoscopy;  Laterality: N/A;  . ESOPHAGOGASTRODUODENOSCOPY (EGD) WITH PROPOFOL N/A 09/02/2015   Procedure: ESOPHAGOGASTRODUODENOSCOPY (EGD) WITH PROPOFOL;  Surgeon: Arta Silence, MD;  [facility];  Service: Endoscopy;  Laterality: N/A;  botulinum toxin injection (100 Units; 25 units in 4 quadrants 1-2 cm proximal to GE junction  . LEFT HEART CATH AND CORONARY ANGIOGRAPHY N/A 01/11/2017   Procedure: Left Heart Cath and Coronary Angiography;  Surgeon: Leonie Man, MD;  Location: [place];  Service: Cardiovascular;  Laterality: N/A;  . PERICARDIAL FLUID DRAINAGE  02/15/2017   Procedure: DRAINAGE OF PERICARDIAL FLUID;  Surgeon: [name], MD;  Location: [place] [facility];  Service: Vascular;;  . skin cancer area removed      right leg healing well  . TEE WITHOUT CARDIOVERSION N/A 02/15/2017   Procedure: TRANSESOPHAGEAL ECHOCARDIOGRAM (TEE);  Surgeon: [name], MD;  Location: [place];  Service: Open Heart Surgery;  Laterality: N/A;  . VAGINAL HYSTERECTOMY           Family History  Problem Relation Age of Onset  . Hypertension Mother   . Anuerysm Mother        died in her 76's  . Arthritis Father        died at age 9  . Heart attack Brother         died in 66's    Social History:  Divorced.  Lives in an in-law apartment next to daughter. Retired. Independent but limited by severe arthritis. She  reports that she has quit smoking. She has never used smokeless tobacco. She reports that she does not drink alcohol or use drugs.         Allergies  Allergen Reactions  . Demerol [Meperidine] Anaphylaxis    Reaction: "Heart stopped"         Medications Prior to Admission  Medication Sig Dispense Refill  . apixaban (ELIQUIS) 5 MG TABS tablet Take 1 tablet (5 mg total) by mouth 2 (two) times daily.    Marland Kitchen atorvastatin (LIPITOR) 20 MG tablet Take 1 tablet (20 mg total) by mouth daily. 30 tablet 6  . diclofenac (VOLTAREN) 50 MG EC tablet Take 1 tablet (50 mg total) by mouth 2 (two) times daily. 180 tablet 3  . digoxin (LANOXIN) 0.125 MG tablet Take 0.125 mg by mouth every morning.    . DULoxetine (CYMBALTA) 30 MG capsule Take 30 mg by mouth at bedtime.     . DULoxetine (CYMBALTA) 60 MG capsule Take 60 mg by mouth daily.  1  .  HYDROcodone-acetaminophen (NORCO/VICODIN) 5-325 MG tablet Take 1 tablet by mouth 2 (two) times daily. Pain    . Liniments (SALONPAS PAIN RELIEF PATCH EX) Apply 1 patch topically daily as needed. For muscle pain    . nebivolol (BYSTOLIC) 5 MG tablet Take 5 mg by mouth every morning.    . nitroGLYCERIN (NITROSTAT) 0.4 MG SL tablet Place 1 tablet (0.4 mg total) under the tongue every 5 (five) minutes x 3 doses as needed for chest pain. 12 tablet 2  . omeprazole (PRILOSEC) 40 MG capsule Take 40 mg by mouth at bedtime.    Marland Kitchen SYNTHROID 100 MCG tablet Take 100 mcg by mouth daily before breakfast.  11    Home: Home Living Family/patient expects to be discharged to:: Private residence Living Arrangements: Children Available Help at Discharge: Family, Available 24 hours/day (daughter owns her own business) Type of Home: House Home Access: Level entry Home Layout: One level Bathroom Shower/Tub:  Multimedia programmer: Handicapped height Bathroom Accessibility: Yes Home Equipment: Environmental consultant - 4 wheels, Shower seat, Hand held shower head, Shower seat - built in, [employer] - tub/shower  Functional History: Prior Function Level of Independence: Independent Functional Status:  Mobility: Bed Mobility Overal bed mobility: Needs Assistance Bed Mobility: Supine to Sit Supine to sit: HOB elevated, Min assist Sit to supine: Mod assist General bed mobility comments: minA for trunk elevation and to bring hips to EOB Transfers Overall transfer level: Needs assistance Equipment used: 2 person hand held assist Transfers: Sit to/from Stand Sit to Stand: Min assist, +2 physical assistance Stand pivot transfers: Mod assist, +2 physical assistance, +2 safety/equipment General transfer comment: minA to power up due to inability to use UEs to push up, minA to steady pt due to R LE soreness Ambulation/Gait Ambulation/Gait assistance: Min assist, +2 safety/equipment Ambulation Distance (Feet): 25 Feet Assistive device: Rolling walker (2 wheeled) Gait Pattern/deviations: Step-to pattern, Decreased stride length, Decreased step length - left, Decreased stance time - right General Gait Details: v/c's to minimize bilat UE Wbing due to sternal precautions. pt with + SOB, pt on 2Lo2 via [place], unable to get SpO2 ready until 5 min after amb which was 95% pt able to tolerate increased R LE WBing Gait velocity: slow Gait velocity interpretation: Below normal speed for age/gender  ADL:  Cognition: Cognition Overall Cognitive Status: Within Functional Limits for tasks assessed Orientation Level: Oriented X4 Cognition Arousal/Alertness: Awake/alert Behavior During Therapy: WFL for tasks assessed/performed Overall Cognitive Status: Within Functional Limits for tasks assessed Area of Impairment: Memory, Following commands, Safety/judgement, Problem solving Memory: Decreased short-term memory,  Decreased recall of precautions Following Commands: Follows one step commands with increased time, Follows one step commands inconsistently Safety/Judgement: Decreased awareness of safety, Decreased awareness of deficits Problem Solving: Slow processing, Decreased initiation, Difficulty sequencing, Requires verbal cues, Requires tactile cues General Comments: pt with good spirits and able to follow commands consistently  Blood pressure 117/73, pulse (!) 53, temperature 97.8 F (36.6 C), temperature source Oral, resp. rate (!) 23, height 5' 3" (1.6 m), weight 66.8 kg (147 lb 4.3 oz), SpO2 (!) 78 %. Physical Exam  Nursing note and vitals reviewed. Constitutional: She appears well-developed and well-nourished.  Right forehead and periorbital ecchymosis resolving.  HENT:  Head: Normocephalic and atraumatic.  Eyes: Conjunctivae and EOM are normal. Pupils are equal, round, and reactive to light.  Neck: Decreased range of motion present.  Decreased ROM due to DDD.   Cardiovascular: Normal rate and regular rhythm.   Respiratory: Effort normal.  No accessory muscle usage or stridor. No respiratory distress. She has decreased breath sounds. She has no wheezes. She exhibits no tenderness.  Audible upper airway wheezing with frequent dry cough. Midline incision clean, dry and intact.   GI: Soft. Bowel sounds are normal. She exhibits no distension. There is no tenderness.  Musculoskeletal: She exhibits edema.  Drain left thigh. Moves BLE without discomfort.   Neurological: She is alert.  Speech clear.  Mild disorientation --question due to decreased hearing. Able to follow simple motor commands without difficulty. Lacks insight/awareness of deficits.  Motor: 4/5 grossly throughout.    Skin: Skin is warm and dry.  Psychiatric: She has a normal mood and affect. Her behavior is normal. Thought content normal.    Lab Results Last 24 Hours       Results for orders placed or performed during the  hospital encounter of 02/11/17 (from the past 24 hour(s))  I-STAT, chem 8     Status: Abnormal   Collection Time: 02/19/17  3:59 PM  Result Value Ref Range   Sodium 134 (L) 135 - 145 mmol/L   Potassium 3.4 (L) 3.5 - 5.1 mmol/L   Chloride 92 (L) 101 - 111 mmol/L   BUN 19 6 - 20 mg/dL   Creatinine, Ser 0.70 0.44 - 1.00 mg/dL   Glucose, Bld 118 (H) 65 - 99 mg/dL   Calcium, Ion 1.13 (L) 1.15 - 1.40 mmol/L   TCO2 30 0 - 100 mmol/L   Hemoglobin 10.2 (L) 12.0 - 15.0 g/dL   HCT 30.0 (L) 36.0 - 46.0 %  Glucose, capillary     Status: None   Collection Time: 02/19/17  4:26 PM  Result Value Ref Range   Glucose-Capillary 99 65 - 99 mg/dL   Comment 1 Capillary Specimen    Comment 2 Notify RN   Glucose, capillary     Status: Abnormal   Collection Time: 02/19/17  9:20 PM  Result Value Ref Range   Glucose-Capillary 101 (H) 65 - 99 mg/dL   Comment 1 Capillary Specimen    Comment 2 Notify RN    Comment 3 Document in Chart   .Cooxemetry Panel (carboxy, met, total hgb, O2 sat)     Status: Abnormal   Collection Time: 02/20/17  4:35 AM  Result Value Ref Range   Total hemoglobin 10.1 (L) 12.0 - 16.0 g/dL   O2 Saturation 59.1 %   Carboxyhemoglobin 1.6 (H) 0.5 - 1.5 %   Methemoglobin 1.2 0.0 - 1.5 %  CBC     Status: Abnormal   Collection Time: 02/20/17  4:40 AM  Result Value Ref Range   WBC 14.8 (H) 4.0 - 10.5 K/uL   RBC 3.46 (L) 3.87 - 5.11 MIL/uL   Hemoglobin 10.0 (L) 12.0 - 15.0 g/dL   HCT 31.1 (L) 36.0 - 46.0 %   MCV 89.9 78.0 - 100.0 fL   MCH 28.9 26.0 - 34.0 pg   MCHC 32.2 30.0 - 36.0 g/dL   RDW 14.4 11.5 - 15.5 %   Platelets 232 150 - 400 K/uL  Basic metabolic panel     Status: Abnormal   Collection Time: 02/20/17  4:40 AM  Result Value Ref Range   Sodium 132 (L) 135 - 145 mmol/L   Potassium 3.3 (L) 3.5 - 5.1 mmol/L   Chloride 95 (L) 101 - 111 mmol/L   CO2 30 22 - 32 mmol/L   Glucose, Bld 102 (H) 65 - 99 mg/dL   BUN 14 6 - 20  mg/dL    Creatinine, Ser 0.60 0.44 - 1.00 mg/dL   Calcium 7.7 (L) 8.9 - 10.3 mg/dL   GFR calc non Af Amer >60 >60 mL/min   GFR calc Af Amer >60 >60 mL/min   Anion gap 7 5 - 15  Glucose, capillary     Status: None   Collection Time: 02/20/17  8:33 AM  Result Value Ref Range   Glucose-Capillary 81 65 - 99 mg/dL   Comment 1 Notify RN       Imaging Results (Last 48 hours)  Dg Chest Port 1 View  Result Date: 02/20/2017 CLINICAL DATA:  83 year old female post aortic valve replacement. Subsequent encounter. EXAM: PORTABLE CHEST 1 VIEW COMPARISON:  02/19/2017. FINDINGS: Right PICC line tip projects at the level of the distal superior vena cava/ cavoatrial junction. Right internal jugular catheter has been removed. Post aortic valve replacement and left atrial appendage clipping. Cardiomegaly. Central pulmonary vascular congestion. Perihilar/basilar subsegmental atelectasis. There may be small pleural effusions. No pneumothorax. Calcified tortuous aorta. Bilateral prominent shoulder joint degenerative changes. IMPRESSION: Right internal jugular catheter has been removed. Right PICC catheter tip distal superior vena cava/ cavoatrial junction level. Cardiomegaly post aortic valve replacement and left atrial appendage clipping. Central pulmonary vascular prominence. Subsegmental atelectasis perihilar region lung bases. There may be small bilateral pleural effusions. Electronically Signed   By: Genia Del M.D.   On: 02/20/2017 07:36   Dg Chest Port 1 View  Result Date: 02/19/2017 CLINICAL DATA:  Central line placement.  Initial encounter. EXAM: PORTABLE CHEST 1 VIEW COMPARISON:  Chest radiograph performed earlier today at 4:44 a.m. FINDINGS: The patient's right PICC is noted ending about the mid SVC. A right IJ line is noted ending about the proximal SVC. A small left pleural effusion is suspected. No pneumothorax is seen. There is trace fluid tracking along the right minor fissure. The  cardiomediastinal silhouette is mildly enlarged. The patient is status post median sternotomy. An aortic valve replacement is noted. No acute osseous abnormalities are identified. Mild degenerative change is noted at the glenohumeral joints bilaterally, with chronic superior subluxation of the humeral heads. IMPRESSION: 1. Right PICC noted ending about the mid SVC. 2. Right IJ line noted ending about the proximal SVC. 3. Suspect small left pleural effusion. Trace fluid tracking along the right minor fissure. Mild cardiomegaly. Electronically Signed   By: Garald Balding M.D.   On: 02/19/2017 20:23   Dg Chest Port 1 View  Result Date: 02/19/2017 CLINICAL DATA:  Status post aortic valve replacement. EXAM: PORTABLE CHEST 1 VIEW COMPARISON:  02/18/2017 FINDINGS: Cyst the previous day's study, the right and left-sided chest tubes have been removed. No convincing pneumothorax. Mild atelectasis extends laterally from the right hilum, improved from the previous day's study. Left mid lung and right lower lung atelectasis noted on the prior exam has mostly resolved. No new lung abnormalities. No pulmonary edema. There is no mediastinal widening. The mediastinal tubes and right internal jugular introducer sheath are stable. IMPRESSION: 1. Status post removal of the chest tubes. No convincing pneumothorax. 2. No acute finding or evidence of an operative complication. No mediastinal widening or pulmonary edema. Electronically Signed   By: Lajean Manes M.D.   On: 02/19/2017 07:17   Dg Swallowing Func-speech Pathology  Result Date: 02/19/2017 Objective Swallowing Evaluation: Type of Study: MBS-Modified Barium Swallow Study Patient Details Name: Lydia Jordan MRN: [id number] Date of Birth: [birth year] Today's Date: 02/19/2017 Time: SLP Start Time (ACUTE ONLY): 1156-SLP Stop Time (ACUTE ONLY):  1220 SLP Time Calculation (min) (ACUTE ONLY): 24 min Past Medical History: Past Medical History: Diagnosis Date . Acid reflux  .  Arthritis  . Ascending aortic aneurysm ([place]) 01/11/2017 . Ascending aortic dissection ([place]) 02/11/2017 . CAD (coronary artery disease)   Cath 01/11/17 showed 50% ost RCA and 50% mLAD --> medical therapy  . Cervical disc disease  . Chronic pain   Secondary to degenerative disc disease and arthritis . Degenerative arthritis  . Hydronephrosis of right kidney  . Hypertension  . Hypothyroidism  . Hypothyroidism  . Longstanding persistent atrial fibrillation ([place])   originally diagnosed 2007 . NSTEMI (non-ST elevated myocardial infarction) ([place]) 01/10/2017 . Pubic ramus fracture, right, closed, initial encounter ([place]) 02/12/2017 . S/P ascending aortic dissection repair 02/15/2017  Straight graft replacement of sub-acute ascending aortic dissection with hemi-arch distal aortic reconstruction . 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 AORTIC VALVE REPLACEMENT N/A 02/15/2017  Procedure: AORTIC VALVE REPLACEMENT (AVR);  Surgeon: [name], MD;  Location: [facility];  Service: Open Heart Surgery;  Laterality: N/A; . BENTALL PROCEDURE N/A 02/15/2017  Procedure: BENTALL AORTIC ROOT REPLACEMENT PROCEDURE, REPAIR ASCENDING AORTIC DISSECTION;  Surgeon: [name], MD;  Location: [place];  Service: Open Heart Surgery;  Laterality: N/A; . CLIPPING OF ATRIAL APPENDAGE Left 02/15/2017  Procedure: CLIPPING OF ATRIAL APPENDAGE;  Surgeon: [name], MD;  Location: [facility];  Service: Open Heart Surgery;  Laterality: Left; . CORONARY ARTERY BYPASS GRAFT N/A 02/15/2017  Procedure: CORONARY ARTERY BYPASS GRAFTING (CABG) x  one, using left saphenous vein;  Surgeon: [name], MD;  [facility];  Service: Vascular;  Laterality:  N/A; . ESOPHAGEAL MANOMETRY N/A 07/31/2015  Procedure: ESOPHAGEAL MANOMETRY (EM);  Surgeon: Arta Silence, MD;  [facility];  Service: Endoscopy;  Laterality: N/A; . ESOPHAGOGASTRODUODENOSCOPY (EGD) WITH PROPOFOL N/A 09/02/2015  Procedure: ESOPHAGOGASTRODUODENOSCOPY (EGD) WITH PROPOFOL;  Surgeon: Arta Silence, MD;  [facility];  Service: Endoscopy;  Laterality: N/A;  botulinum toxin injection (100 Units; 25 units in 4 quadrants 1-2 cm proximal to GE junction . LEFT HEART CATH AND CORONARY ANGIOGRAPHY N/A 01/11/2017  Procedure: Left Heart Cath and Coronary Angiography;  Surgeon: Leonie Man, MD;  Location: [place] [facility];  Service: Cardiovascular;  Laterality: N/A; . PERICARDIAL FLUID DRAINAGE  02/15/2017  Procedure: DRAINAGE OF PERICARDIAL FLUID;  Surgeon: [name], MD;  Location: [place];  Service: Vascular;; . skin cancer area removed     right leg healing well . TEE WITHOUT CARDIOVERSION N/A 02/15/2017  Procedure: TRANSESOPHAGEAL ECHOCARDIOGRAM (TEE);  Surgeon: [name], MD;  Location: [place];  Service: Open Heart Surgery;  Laterality: N/A; . VAGINAL HYSTERECTOMY   HPI: Pt is an 83 y.o. female with PMH of atrial fibrillation on eliquis, HTN, hypothyroidism, acid reflux, recent NSTEMI, aortic dissection. Pt had a mechanical fall on 5/9, during which time she bruised her face and hip. She had pain after the fall and felt she needed to be seen, therefore presenting 5/12. Given that she is on anticoagulation, she underwent CT of head and pelvis. Initial CT concerning for unclear mass vs. Consolidation at left lung apex, so repeat CT with contrast performed. CT showed type A dissection. Underwent CT surgery on 5/16 and  was extubated 5/17. CXR 5/19 showed mild bilateral atelectasis. Bedside swallow eval ordered due to coughing observed with oral intake per MD. Pt currently on dysphagia 1 diet/ nectar thick liquids. barium swallow 06/2015: "Significant tertiary contractions in  the mid and distal esophagus; short segment stricture of the distal esophagus just above the small hiatal hernia. The barium pill lodges at the level of the short segment distal esophageal stricture; prominent cricopharyngeus muscle. " Pt reports botox tx to esophagus.   Subjective: alert, communicative Assessment / Plan / Recommendation CHL IP CLINICAL IMPRESSIONS 02/19/2017 Clinical Impression Pt presents with a functional oropharyngeal swallow, marked by high penetration of thin liquids occasionally, not considered disordered in the elderly.  There was adequate mastication; good pharyngeal clearance of POs; no aspiration.  Prominent cricopharyngeus was evident, and esophageal sweep revealed barium stasis filling the esophageal column and reaching cervical level.  Pt acknowledges hx of esophageal deficits.  For now, recommend changing diet to full liquids (thin liquids/straws permitted); pt should sit upright for meals and 45 minutes after.  She may benefit from repeat esophageal w/u when appropriate.  She is at risk for backflow/aspiration of esophageal material.  RN present; discussed results/recommendations.  SLP Visit Diagnosis Dysphagia, pharyngoesophageal phase (R13.14) Attention and concentration deficit following -- Frontal lobe and executive function deficit following -- Impact on safety and function Mild aspiration risk   CHL IP TREATMENT RECOMMENDATION 02/19/2017 Treatment Recommendations Therapy as outlined in treatment plan below   Prognosis 02/18/2017 Prognosis for Safe Diet Advancement (No Data) Barriers to Reach Goals -- Barriers/Prognosis Comment -- CHL IP DIET RECOMMENDATION 02/19/2017 SLP Diet Recommendations Other (Comment) Liquid Administration via Straw Medication Administration Crushed with puree Compensations Follow solids with liquid Postural Changes Seated upright at 90 degrees   CHL IP OTHER RECOMMENDATIONS 02/19/2017 Recommended Consults Consider esophageal assessment Oral Care  Recommendations Oral care BID Other Recommendations --   CHL IP FOLLOW UP RECOMMENDATIONS 02/19/2017 Follow up Recommendations (No Data)   CHL IP FREQUENCY AND DURATION 02/19/2017 Speech Therapy Frequency (ACUTE ONLY) min 2x/week Treatment Duration 1 week      CHL IP ORAL PHASE 02/19/2017 Oral Phase WFL Oral - Pudding Teaspoon -- Oral - Pudding Cup -- Oral - Honey Teaspoon -- Oral - Honey Cup -- Oral - Nectar Teaspoon -- Oral - Nectar Cup -- Oral - Nectar Straw -- Oral - Thin Teaspoon -- Oral - Thin Cup -- Oral - Thin Straw -- Oral - Puree -- Oral - Mech Soft -- Oral - Regular -- Oral - Multi-Consistency -- Oral - Pill -- Oral Phase - Comment --  CHL IP PHARYNGEAL PHASE 02/19/2017 Pharyngeal Phase WFL Pharyngeal- Pudding Teaspoon -- Pharyngeal -- Pharyngeal- Pudding Cup -- Pharyngeal -- Pharyngeal- Honey Teaspoon -- Pharyngeal -- Pharyngeal- Honey Cup -- Pharyngeal -- Pharyngeal- Nectar Teaspoon -- Pharyngeal -- Pharyngeal- Nectar Cup -- Pharyngeal -- Pharyngeal- Nectar Straw -- Pharyngeal -- Pharyngeal- Thin Teaspoon -- Pharyngeal -- Pharyngeal- Thin Cup -- Pharyngeal -- Pharyngeal- Thin Straw -- Pharyngeal -- Pharyngeal- Puree -- Pharyngeal -- Pharyngeal- Mechanical Soft -- Pharyngeal -- Pharyngeal- Regular -- Pharyngeal -- Pharyngeal- Multi-consistency -- Pharyngeal -- Pharyngeal- Pill -- Pharyngeal -- Pharyngeal Comment --  CHL IP CERVICAL ESOPHAGEAL PHASE 02/19/2017 Cervical Esophageal Phase Impaired Pudding Teaspoon -- Pudding Cup -- Honey Teaspoon -- Honey Cup -- Nectar Teaspoon -- Nectar Cup -- Nectar Straw -- Thin Teaspoon -- Thin Cup -- Thin Straw -- Puree -- Mechanical Soft -- Regular -- Multi-consistency -- Pill -- Cervical Esophageal Comment (No Data) No flowsheet  data found. [name] 02/19/2017, 1:11 PM                Assessment/Plan: Diagnosis: Debility Labs and images independently reviewed.  Records reviewed and summated above.  1. Does the need for close, 24 hr/day medical  supervision in concert with the patient's rehab needs make it unreasonable for this patient to be served in a less intensive setting? Yes 2. Co-Morbidities requiring supervision/potential complications: dysphagia (advance diet as tolerated), A fib (monitor HR with increased physical activity), recent NSTEMI (cont meds), achalasia (see dysphagia), DJD/DDD with chronic pain (Biofeedback training with therapies to help reduce reliance on opiate pain medications, monitor pain control during therapies, and sedation at rest and titrate to maximum efficacy to ensure participation and gains in therapies), tachypnea (monitor RR and O2 Sats with increased physical exertion), hypokalemia (continue to monitor and replete as necessary), leukocytosis (cont to monitor for signs and symptoms of infection, further workup if indicated) 3. Due to safety, skin/wound care, disease management, pain management and patient education, does the patient require 24 hr/day rehab nursing? Yes 4. Does the patient require coordinated care of a physician, rehab nurse, PT (1-2 hrs/day, 5 days/week), OT (1-2 hrs/day, 5 days/week) and SLP (1-2 hrs/day, 5 days/week) to address physical and functional deficits in the context of the above medical diagnosis(es)? Yes Addressing deficits in the following areas: balance, endurance, locomotion, strength, transferring, bathing, dressing, toileting, cognition, swallowing and psychosocial support 5. Can the patient actively participate in an intensive therapy program of at least 3 hrs of therapy per day at least 5 days per week? Potentially 6. The potential for patient to make measurable gains while on inpatient rehab is excellent 7. Anticipated functional outcomes upon discharge from inpatient rehab are supervision  with PT, modified independent and supervision with OT, modified independent with SLP. 8. Estimated rehab length of stay to reach the above functional goals is: 12-16 days. 9. Anticipated  D/C setting: Home 10. Anticipated post D/C treatments: HH therapy and Home excercise program 11. Overall Rehab/Functional Prognosis: excellent  RECOMMENDATIONS: This patient's condition is appropriate for continued rehabilitative care in the following setting: CIR Patient has agreed to participate in recommended program. Potentially Note that insurance prior authorization may be required for reimbursement for recommended care.  Comment: Rehab Admissions Coordinator to follow up.  Delice Lesch, MD, Mellody Drown Bary [place], [place] 02/20/2017    Revision History                   Routing History

## 2017-02-21 NOTE — Discharge Instructions (Signed)
Coronary Artery Bypass Grafting, Care After ° °This sheet gives you information about how to care for yourself after your procedure. Your health care provider may also give you more specific instructions. If you have problems or questions, contact your health care provider. °What can I expect after the procedure? °After the procedure, it is common to have: °· Nausea and a lack of appetite. °· Constipation. °· Weakness and fatigue. °· Depression or irritability. °· Pain or discomfort in your incision areas. °Follow these instructions at home: °Medicines  °· Take over-the-counter and prescription medicines only as told by your health care provider. Do not stop taking medicines or start any new medicines without approval from your health care provider. °· If you were prescribed an antibiotic medicine, take it as told by your health care provider. Do not stop taking the antibiotic even if you start to feel better. °· Do not drive or use heavy machinery while taking prescription pain medicine. °Incision care  °· Follow instructions from your health care provider about how to take care of your incisions. Make sure you: °¨ Wash your hands with soap and water before you change your bandage (dressing). If soap and water are not available, use hand sanitizer. °¨ Change your dressing as told by your health care provider. °¨ Leave stitches (sutures), skin glue, or adhesive strips in place. These skin closures may need to stay in place for 2 weeks or longer. If adhesive strip edges start to loosen and curl up, you may trim the loose edges. Do not remove adhesive strips completely unless your health care provider tells you to do that. °· Keep incision areas clean, dry, and protected. °· Check your incision areas every day for signs of infection. Check for: °¨ More redness, swelling, or pain. °¨ More fluid or blood. °¨ Warmth. °¨ Pus or a bad smell. °· If incisions were made in your legs: °¨ Avoid crossing your legs. °¨ Avoid  sitting for long periods of time. Change positions every 30 minutes. °¨ Raise (elevate) your legs when you are sitting. °Bathing  °· Do not take baths, swim, or use a hot tub until your health care provider approves. °· Only take sponge baths. Pat the incisions dry. Do not rub incisions with a washcloth or towel. °· Ask your health care provider when you can shower. °Eating and drinking  °· Eat foods that are high in fiber, such as raw fruits and vegetables, whole grains, beans, and nuts. Meats should be lean cut. Avoid canned, processed, and fried foods. This can help prevent constipation and is a recommended part of a heart-healthy diet. °· Drink enough fluid to keep your urine clear or pale yellow. °· Limit alcohol intake to no more than 1 drink a day for nonpregnant women and 2 drinks a day for men. One drink equals 12 oz of beer, 5 oz of wine, or 1½ oz of hard liquor. °Activity  °· Rest and limit your activity as told by your health care provider. You may be instructed to: °¨ Stop any activity right away if you have chest pain, shortness of breath, irregular heartbeats, or dizziness. Get help right away if you have any of these symptoms. °¨ Move around frequently for short periods or take short walks as directed by your health care provider. Gradually increase your activities. You may need physical therapy or cardiac rehabilitation to help strengthen your muscles and build your endurance. °¨ Avoid lifting, pushing, or pulling anything that is heavier than 10   lb (4.5 kg) for at least 6 weeks or as told by your health care provider. °· Do not drive until your health care provider approves. °· Ask your health care provider when you may return to work. °· Ask your health care provider when you may resume sexual activity. °General instructions  °· Do not use any products that contain nicotine or tobacco, such as cigarettes and e-cigarettes. If you need help quitting, ask your health care provider. °· Take 2-3 deep  breaths every few hours during the day, while you recover. This helps expand your lungs and prevent complications like pneumonia after surgery. °· If you were given a device called an incentive spirometer, use it several times a day to practice deep breathing. Support your chest with a pillow or your arms when you take deep breaths or cough. °· Wear compression stockings as told by your health care provider. These stockings help to prevent blood clots and reduce swelling in your legs. °· Weigh yourself every day. This helps identify if your body is holding (retaining) fluid that may make your heart and lungs work harder. °· Keep all follow-up visits as told by your health care provider. This is important. °Contact a health care provider if: °· You have more redness, swelling, or pain around any incision. °· You have more fluid or blood coming from any incision. °· Any incision feels warm to the touch. °· You have pus or a bad smell coming from any incision °· You have a fever. °· You have swelling in your ankles or legs. °· You have pain in your legs. °· You gain 2 lb (0.9 kg) or more a day. °· You are nauseous or you vomit. °· You have diarrhea. °Get help right away if: °· You have chest pain that spreads to your jaw or arms. °· You are short of breath. °· You have a fast or irregular heartbeat. °· You notice a "clicking" in your breastbone (sternum) when you move. °· You have numbness or weakness in your arms or legs. °· You feel dizzy or light-headed. °Summary °· After the procedure, it is common to have pain or discomfort in the incision areas. °· Do not take baths, swim, or use a hot tub until your health care provider approves. °· Gradually increase your activities. You may need physical therapy or cardiac rehabilitation to help strengthen your muscles and build your endurance. °· Weigh yourself every day. This helps identify if your body is holding (retaining) fluid that may make your heart and lungs work  harder. °This information is not intended to replace advice given to you by your health care provider. Make sure you discuss any questions you have with your health care provider. °Document Released: 04/08/2005 Document Revised: 08/08/2016 Document Reviewed: 08/08/2016 °Elsevier Interactive Patient Education © 2017 [employer] ° °

## 2017-02-21 NOTE — Progress Notes (Signed)
Physical Therapy Treatment Patient Details Name: Lydia Jordan MRN: [id number] DOB: [birth year] Today's Date: 02/21/2017    History of Present Illness Pt is an 83 y.o. female with a history of chronic atrial fibrillation on ELIQUIS, recent non-STEMI with minimal CAD as well as hypertension and hypothyroidism who presented after mechanical fall on May 19. She suffered a pelvic fracture. Pt underwent a CT scan which revealed a type a aortic dissection complicated by pericardial effusion. Pt is now s/p CABG, aneurysm repair and drainage of pericardial effusion on 5/16.     PT Comments    Pt pleasant with decreased memory and cognition. Disoriented to day and unable to recall sternal precautions despite education x 3 during session. Pt with improved gait tolerance and educated for HEP, transfers, DME use and precautions. Will continue to follow. Pt reports chronic back and neck pain also limit mobility.   HR 86-94 113/98 before gait, 123/79 after gait sats 97% on 2L but not a good pleth to know accuracy, pt with noted wheezing    Follow Up Recommendations  CIR;Supervision/Assistance - 24 hour     Equipment Recommendations  None recommended by PT    Recommendations for Other Services       Precautions / Restrictions Precautions Precautions: Fall;Sternal Precaution Comments: pt only able to recall 1/5 even after education 3x during session Restrictions RLE Weight Bearing: Weight bearing as tolerated    Mobility  Bed Mobility               General bed mobility comments: pt in chair on arrival  Transfers Overall transfer level: Needs assistance     Sit to Stand: Mod assist;Min assist;+2 physical assistance         General transfer comment: mod assist +2 initial stand from chair, min assist on 2nd trial with cues for hand placement, assist for anterior translation and rise  Ambulation/Gait Ambulation/Gait assistance: Min assist;+2 safety/equipment Ambulation  Distance (Feet): 35 Feet Assistive device: Rolling walker (2 wheeled) Gait Pattern/deviations: Step-through pattern;Decreased stride length;Trunk flexed   Gait velocity interpretation: Below normal speed for age/gender General Gait Details: cues for posture and position in RW with chair to follow. Pt walked 48' and 30' with seated rest and chair follow   Stairs            Wheelchair Mobility    Modified Rankin (Stroke Patients Only)       Balance Overall balance assessment: Needs assistance   Sitting balance-Leahy Scale: Fair       Standing balance-Leahy Scale: Poor                              Cognition Arousal/Alertness: Awake/alert Behavior During Therapy: WFL for tasks assessed/performed Overall Cognitive Status: Impaired/Different from baseline Area of Impairment: Memory;Following commands;Safety/judgement;Problem solving                     Memory: Decreased short-term memory;Decreased recall of precautions Following Commands: Follows one step commands with increased time Safety/Judgement: Decreased awareness of safety;Decreased awareness of deficits   Problem Solving: Slow processing;Decreased initiation;Difficulty sequencing;Requires verbal cues;Requires tactile cues        Exercises General Exercises - Lower Extremity Long Arc Quad: AROM;Both;Seated;15 reps Hip Flexion/Marching: AROM;Both;Seated;10 reps    General Comments        Pertinent Vitals/Pain Pain Score: 4  Pain Location: back and neck Pain Descriptors / Indicators: Sore Pain Intervention(s): Limited activity within patient's tolerance;Repositioned  Home Living                      Prior Function            PT Goals (current goals can now be found in the care plan section) Progress towards PT goals: Progressing toward goals    Frequency           PT Plan Current plan remains appropriate    Co-evaluation              AM-PAC PT "6  Clicks" Daily Activity  Outcome Measure  Difficulty turning over in bed (including adjusting bedclothes, sheets and blankets)?: A Lot Difficulty moving from lying on back to sitting on the side of the bed? : Total Difficulty sitting down on and standing up from a chair with arms (e.g., wheelchair, bedside commode, etc,.)?: Total Help needed moving to and from a bed to chair (including a wheelchair)?: A Lot Help needed walking in hospital room?: A Little Help needed climbing 3-5 steps with a railing? : A Lot 6 Click Score: 11    End of Session Equipment Utilized During Treatment: Oxygen;Gait belt Activity Tolerance: Patient tolerated treatment well Patient left: in chair;with call bell/phone within reach;with family/visitor present Nurse Communication: Mobility status;Precautions PT Visit Diagnosis: Other abnormalities of gait and mobility (R26.89);Difficulty in walking, not elsewhere classified (R26.2);Muscle weakness (generalized) (M62.81)     Time: 5956-3875 PT Time Calculation (min) (ACUTE ONLY): 32 min  Charges:  $Gait Training: 8-22 mins $Therapeutic Exercise: 8-22 mins                    G Codes:       Elwyn Reach, PT [phone]   [place] 02/21/2017, 9:34 AM

## 2017-02-21 NOTE — Care Management Note (Signed)
Case Management Note Marvetta Gibbons RN, BSN Unit 2W-Case Manager [phone]  Patient Details  Name: Lydia Jordan MRN: [id number] Date of Birth: 05-12-1934  Subjective/Objective:  Pt admitted with Type A Aortic dissection- pt had mechanical fall on 5/9- with pubic ramus fx- per ortho will tx non surgical with WBAT- plan for pt to go to OR on 5/16- for Aortic dissection repair.                    Action/Plan: PTA pt lived at home-house place that is joined to daughters home. Pt limited in mobility- per PT eval pre-op recommendation for SNF- CSW has been consulted for SNF needs.   Expected Discharge Date:  02/21/17               Expected Discharge Plan:  [place]  In-House Referral:  Clinical Social Work  Discharge planning Services  CM Consult  Post Acute Care Choice:  IP Rehab Choice offered to:  Patient  DME Arranged:    DME Agency:     HH Arranged:    [place] Agency:     Status of Service:  Completed, signed off  If discussed at [employer] of Stay Meetings, dates discussed:  5/22  Discharge Disposition: IP rehab- CIR  Additional Comments:  02/21/17- 1200- Marvetta Gibbons RN, CM- pt has been evaluated by CIR- per conversation with Pamala Hurry with CIR- pt has a bed offer for CIR today and they can admit later today to CIR- bedside RN to notify PA with surgery for d/c order. Have let CSW know d/c plan to CIR- pt will d/c later today to available CIR bed.   Dawayne Patricia, RN 02/21/2017, 2:49 PM

## 2017-02-21 NOTE — Progress Notes (Signed)
CARDIAC REHAB PHASE I   Pt for discharge to CIR today, progressing with PT. Cardiac surgery discharge education completed with pt and daughter at bedside. Reviewed IS, sternal precautions, activity progression, heart healthy diet and  phase 2 cardiac rehab. Pt and daughter verbalized understanding. Pt agrees to phase 2 cardiac rehab referral, will send to [facility] per pt request. Pt in bed, call bell within reach.    [facility], RN, BSN 02/21/2017 12:06 PM

## 2017-02-21 NOTE — Progress Notes (Signed)
  Speech Language Pathology Treatment: Dysphagia  Patient Details Name: Lydia Jordan MRN: [id number] DOB: October 06, 1933 Today's Date: 02/21/2017 Time: 1340-1405 SLP Time Calculation (min) (ACUTE ONLY): 25 min  Assessment / Plan / Recommendation Clinical Impression  Session focused primarily on education with pt/ daughter.  Pt drowsy today; participated in limited therapy - consumed thin liquids with straw seated upright.  Reviewed esophageal strategies to facilitate transfer of POs.  Pt requires mod cues for recall.  Showed her daughter the MBS video of swallow study, including esophageal sweep that revealed significant stasis - we discussed nature of deficits related to achalasia, compensatory strategies, concern for backflow/potential aspiration, and the benefit of further f/u with GI to determine if intervention would be helpful (last endo with botox was November 2016).  Daughter verbalizes understanding; pt needs reinforcement.  For likely D/C to CIR today.   HPI HPI: Pt is an 83 y.o. female with PMH of atrial fibrillation on eliquis, HTN, hypothyroidism, acid reflux, recent NSTEMI, aortic dissection. Pt had a mechanical fall on 5/9, during which time she bruised her face and hip. She had pain after the fall and felt she needed to be seen, therefore presenting 5/12. Given that she is on anticoagulation, she underwent CT of head and pelvis. Initial CT concerning for unclear mass vs. Consolidation at left lung apex, so repeat CT with contrast performed. CT showed type A dissection. Underwent CT surgery on 5/16 and was extubated 5/17. CXR 5/19 showed mild bilateral atelectasis. Bedside swallow eval ordered due to coughing observed with oral intake per MD. Pt currently on dysphagia 1 diet/ nectar thick liquids. barium swallow 06/2015: Significant tertiary contractions in the mid and distal esophagus; short segment stricture of the distal esophagus just above the small hiatal hernia. The barium pill lodges  at the level of the short segment distal esophageal stricture; prominent cricopharyngeus muscle.      SLP Plan  Continue with current plan of care       Recommendations  Diet recommendations: Regular;Thin liquid Liquids provided via: Cup;Straw Medication Administration: Whole meds with liquid Supervision: Patient able to self feed;Intermittent supervision to cue for compensatory strategies Compensations: Slow rate;Small sips/bites;Follow solids with liquid Postural Changes and/or Swallow Maneuvers: Seated upright 90 degrees;Upright 30-60 min after meal                Oral Care Recommendations: Oral care BID Follow up Recommendations: Inpatient Rehab SLP Visit Diagnosis: Dysphagia, pharyngoesophageal phase (R13.14) Plan: Continue with current plan of care       GO                [name] 02/21/2017, 2:08 PM

## 2017-02-21 NOTE — Progress Notes (Signed)
      [address] HeightsSuite [address]       [place] [address]             [phone]      6 Days Post-Op Procedure(s) (LRB): BENTALL AORTIC ROOT REPLACEMENT PROCEDURE, REPAIR ASCENDING AORTIC DISSECTION (N/A) TRANSESOPHAGEAL ECHOCARDIOGRAM (TEE) (N/A) CLIPPING OF ATRIAL APPENDAGE (Left) DRAINAGE OF PERICARDIAL FLUID CORONARY ARTERY BYPASS GRAFTING (CABG) x  one, using left saphenous vein (N/A) AORTIC VALVE REPLACEMENT (AVR) (N/A)   Subjective:  Lydia Jordan has no new complaints this morning.  She participated with PT this morning.  She has some mild incisional discomfort.  + BM  Objective: Vital signs in last 24 hours: Temp:  [97.7 F (36.5 C)-98.4 F (36.9 C)] 98.2 F (36.8 C) (05/22 0547) Pulse Rate:  [53-110] 73 (05/22 0547) Cardiac Rhythm: Atrial fibrillation (05/22 0700) Resp:  [18-26] 18 (05/22 0547) BP: (89-146)/(50-123) 120/79 (05/22 0547) SpO2:  [67 %-100 %] 67 % (05/22 0547) Weight:  [146 lb 12.8 oz (66.6 kg)] 146 lb 12.8 oz (66.6 kg) (05/22 0547)  Intake/Output from previous day: 05/21 0701 - 05/22 0700 In: 293.7 [I.V.:43.7; IV Piggyback:250] Out: 2100 [Urine:1950; Drains:150]  General appearance: alert, cooperative and no distress Heart: irregularly irregular rhythm Lungs: clear to auscultation bilaterally Abdomen: soft, non-tender; bowel sounds normal; no masses,  no organomegaly Extremities: edema 1-2+ pitting Wound: clean and dry  Lab Results:  Recent Labs  02/20/17 0440 02/21/17 0453  WBC 14.8* 15.8*  HGB 10.0* 10.5*  HCT 31.1* 33.1*  PLT 232 276   BMET:  Recent Labs  02/20/17 0440 02/21/17 0453  NA 132* 133*  K 3.3* 4.3  CL 95* 95*  CO2 30 32  GLUCOSE 102* 88  BUN 14 17  CREATININE 0.60 0.62  CALCIUM 7.7* 8.2*    PT/INR: No results for input(s): LABPROT, INR in the last 72 hours. ABG    Component Value Date/Time   PHART 7.343 (L) 02/16/2017 1104   HCO3 24.1 02/16/2017 1104   TCO2 30 02/19/2017 1559   ACIDBASEDEF 2.0  02/16/2017 1104   O2SAT 59.1 02/20/2017 0435   CBG (last 3)   Recent Labs  02/19/17 2120 02/20/17 0833 02/20/17 2018  GLUCAP 101* 81 142*    Assessment/Plan: S/P Procedure(s) (LRB): BENTALL AORTIC ROOT REPLACEMENT PROCEDURE, REPAIR ASCENDING AORTIC DISSECTION (N/A) TRANSESOPHAGEAL ECHOCARDIOGRAM (TEE) (N/A) CLIPPING OF ATRIAL APPENDAGE (Left) DRAINAGE OF PERICARDIAL FLUID CORONARY ARTERY BYPASS GRAFTING (CABG) x  one, using left saphenous vein (N/A) AORTIC VALVE REPLACEMENT (AVR) (N/A)  1. CV- chronic A.Fib- on Coreg and Digoxin.. BP is high at times.. May benefit from low dose ACE inhibitor, will restart home Eliquis at discharge 2. Pulm- wean oxygen as tolerated, continue IS for atelectasis 3. Renal- creatinine WNL. Weight is elevated, continue IV Lasix.Marland KitchenHypokalemia resolved  4. Dysphagia- SLP following, continue Dysphagia 3 diet for now 5. Deconditioning- good participation with PT/OT, CIR consult placed 6. Dispo- patient stable, making good progress, continue current care.. To CIR if bed gets approved   LOS: 9 days    Lydia Jordan 02/21/2017

## 2017-02-21 NOTE — Progress Notes (Signed)
Lydia Gong, RN Rehab Admission Coordinator Signed Physical Medicine and Rehabilitation  PMR Pre-admission Date of Service: 02/21/2017 11:46 AM  Related encounter: ED to Hosp-Admission (Discharged) from 02/11/2017 in [place]       [] Hide copied text PMR Admission Coordinator Pre-Admission Assessment  Patient: Lydia Jordan is an 83 y.o., female MRN: [id number] DOB: [birth year] Height: 5\' 3"  (160 cm) Weight: 66.6 kg (146 lb 12.8 oz)                                                                                                                                                  Insurance Information HMO:     PPO:      PCP:      IPA:      80/20: yes     OTHER: no HMO PRIMARY: Medicare a and b      Policy#: [id number]      Subscriber: pt Benefits:  Phone #: online     Name: 02/21/2017 Eff. Date: 01/01/2001     Deduct: $1340      Out of Pocket Max: none      Life Max: none CIR: 100%      SNF: 20 full days Outpatient: 80%     Co-Pay: 20% Home Health: 100%      Co-Pay: none DME: 80%     Co-Pay: 20% Providers: pt choice  SECONDARY: AARP supplement      Policy#: [id number]      Subscriber: pt  Medicaid Application Date:       Case Manager:  Disability Application Date:       Case Worker:   Emergency Contact Information        Contact Information    Name Relation Home Work Mobile   Allen,Tina Daughter [phone]       Current Medical History  Patient Admitting Diagnosis: debility  History of Present Illness: Lydia Jordan a 83 y.o.femalewith history of A fib, recent NSTEMI, achalasia, DJD/DDD with chronic pain who sustained a fall 02/08/17 with pain in right hip and decline in mobility. Evaluation in ED 5/12 revealed right pubic rami fracture, chronic right UPJ obstruction with moderate to severe hydronephrosis as well as type A aortic dissection. She was evaluated by Dr. Roxy Manns and was cleared for repair of aneurysm on  02/15/17.She was taken to the operating room on 5/16. She underwent CABG x 1 with SVG to RCA with open harvest of saphenous vein from left thigh, Clipping of LA Appendage, Drainage of Pericardial Effusion, Biologic Bentall procedure, and Repair of Ascending thoracic aortic dissection. Pubic rami fracture to be WBAT per Dr. Fredonia Highland. Post op on milironone for fluid overload and on low dose Lovenox for A fib. She has had reports of dysphagia and MBS done functional swallow with high penetration of thin liquids  occasionally and modified diet recommended by ST. CXR reviewed 5/21, stable changes. Therapy ongoing and patient limited by SOB and fatigue. Patient with significant deficits in mobility and ability to carry out ADL tasks. CIR recommended for follow up therapy. Independent PTA--has started using AD recently due to multiple falls. Sister and daughtersupportive and can assist as needed after discharge.   Past Medical History  Past Medical History:  Diagnosis Date  . Acid reflux   . Arthritis   . Ascending aortic aneurysm ([place]) 01/11/2017  . Ascending aortic dissection ([place]) 02/11/2017  . CAD (coronary artery disease)    Cath 01/11/17 showed 50% ost RCA and 50% mLAD --> medical therapy   . Cervical disc disease   . Chronic diastolic congestive heart failure ([place])   . Chronic pain    Secondary to degenerative disc disease and arthritis  . Degenerative arthritis   . Hydronephrosis of right kidney   . Hypertension   . Hypothyroidism   . Hypothyroidism   . Longstanding persistent atrial fibrillation ([place])    originally diagnosed 2007  . NSTEMI (non-ST elevated myocardial infarction) ([place]) 01/10/2017  . Pubic ramus fracture, right, closed, initial encounter ([place]) 02/12/2017  . S/P ascending aortic dissection repair 02/15/2017   Straight graft replacement of sub-acute ascending aortic dissection with hemi-arch distal aortic reconstruction  . 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  Prior Rehab/Hospitalizations:  Has the patient had major surgery during 100 days prior to admission? No  Current Medications   Current Facility-Administered Medications:  .  0.9 %  sodium chloride infusion, 250 mL, Intravenous, Continuous, [name], MD, Last Rate: 1 mL/hr at 02/19/17 1400, 250 mL at 02/19/17 1400 .  0.9 %  sodium chloride infusion, 250 mL, Intravenous, PRN, [name], MD .  aspirin EC tablet 81 mg, 81 mg, Oral, Daily, 81 mg at 02/21/17 0848 **OR** [DISCONTINUED] aspirin chewable tablet 324 mg, 324 mg, Per Tube, Daily, [name], MD .  atorvastatin (LIPITOR) tablet 20 mg, 20 mg, Oral, Daily, [name], MD, 20 mg at 02/21/17 0847 .  bisacodyl (DULCOLAX) EC tablet 10 mg, 10 mg, Oral, Daily, 10 mg at 02/19/17 1007 **OR** bisacodyl (DULCOLAX) suppository 10 mg, 10 mg, Rectal, Daily, [name], MD .  carvedilol (COREG) tablet 3.125 mg, 3.125 mg, Oral, BID WC, Prescott Gum, Collier Salina, MD, 3.125 mg at 02/21/17 0847 .  Chlorhexidine Gluconate Cloth 2 % PADS 6 each, 6 each, Topical, Q0600, [name], MD, 6 each at 02/21/17 [phone] .  digoxin (LANOXIN) tablet 0.125 mg, 0.125 mg, Oral, q morning - 10a, [name], MD, 0.125 mg at 02/21/17 0847 .  docusate sodium (COLACE) capsule 200 mg, 200 mg, Oral, Daily, [name], MD, 200 mg at 02/21/17 0847 .  DULoxetine (CYMBALTA) DR capsule 30 mg, 30 mg, Oral, QHS, [name], MD, 30 mg at 02/20/17 2117 .  DULoxetine (CYMBALTA) DR capsule 60 mg, 60 mg, Oral, Daily, [name], MD, 60 mg at 02/21/17  0847 .  enoxaparin (LOVENOX) injection 30 mg, 30 mg, Subcutaneous, Q24H, Prescott Gum, Collier Salina, MD, 30 mg at 02/20/17  2117 .  feeding supplement (ENSURE ENLIVE) (ENSURE ENLIVE) liquid 237 mL, 237 mL, Oral, TID WC, Prescott Gum, Collier Salina, MD, 237 mL at 02/21/17 0902 .  folic acid-pyridoxine-cyancobalamin (FOLTX) 2.5-25-2 MG per tablet 1 tablet, 1 tablet, Oral, Daily, [name], MD, 1 tablet at 02/21/17 0846 .  furosemide (LASIX) injection 20 mg, 20 mg, Intravenous, BID, Prescott Gum, Collier Salina, MD, 20 mg at 02/21/17 0846 .  iron polysaccharides (NIFEREX) capsule 150 mg, 150 mg, Oral, Daily, [name], MD, 150 mg at 02/21/17 0846 .  levothyroxine (SYNTHROID, LEVOTHROID) tablet 100 mcg, 100 mcg, Oral, QAC breakfast, [name], MD, 100 mcg at 02/21/17 0604 .  MEDLINE mouth rinse, 15 mL, Mouth Rinse, BID, [name], MD, 15 mL at 02/21/17 1000 .  metoprolol tartrate (LOPRESSOR) injection 2.5-5 mg, 2.5-5 mg, Intravenous, Q2H PRN, [name], MD, 5 mg at 02/19/17 0817 .  morphine 4 MG/ML injection 1-2 mg, 1-2 mg, Intravenous, Q1H PRN, [name], MD, 2 mg at 02/16/17 2327 .  ondansetron (ZOFRAN) injection 4 mg, 4 mg, Intravenous, Q6H PRN, [name], MD .  pantoprazole (PROTONIX) EC tablet 80 mg, 80 mg, Oral, Daily, [name], MD, 80 mg at 02/21/17 0846 .  potassium chloride (KLOR-CON) packet 40 mEq, 40 mEq, Oral, BID, [name], MD, 40 mEq at 02/21/17 0848 .  RESOURCE THICKENUP CLEAR, , Oral, PRN, Prescott Gum, Peter, MD .  sodium chloride flush (NS) 0.9 % injection 10-40 mL, 10-40 mL, Intracatheter, PRN, [name], MD, 10 mL at 02/21/17 0458 .  sodium chloride flush (NS) 0.9 % injection 3 mL, 3 mL, Intravenous, Q12H, [name], MD, 3 mL at 02/21/17 1000 .  sodium chloride flush (NS) 0.9 % injection 3 mL, 3 mL, Intravenous, PRN, [name], MD .  sodium chloride flush (NS) 0.9 % injection 3 mL, 3 mL, Intravenous, Q12H, [name], MD, 3 mL at  02/20/17 2120 .  sodium chloride flush (NS) 0.9 % injection 3 mL, 3 mL, Intravenous, PRN, [name], MD .  traMADol Veatrice Bourbon) tablet 50-100 mg, 50-100 mg, Oral, Q4H PRN, [name], MD, 50 mg at 02/21/17 1024  Patients Current Diet: DIET DYS 3 Room service appropriate? Yes; Fluid consistency: Thin  Precautions / Restrictions Precautions Precautions: Fall, Sternal Precaution Comments: able to state 1 sternal precautions Restrictions Weight Bearing Restrictions: No RLE Weight Bearing: Weight bearing as tolerated   Has the patient had 2 or more falls or a fall with injury in the past year?No  Prior Activity Level Community (5-7x/wk): Independent pta without AD. Did not drive; cooked, laundry, etc. Pt did on cooking, cleaning and laundry. Very independent.  Home Assistive Devices / Equipment Home Assistive Devices/Equipment: Environmental consultant (specify type) Home Equipment: Walker - 4 wheels, Shower seat, Hand held shower head, Shower seat - built in, [employer] - tub/shower  Prior Device Use: Indicate devices/aids used by the patient prior to current illness, exacerbation or injury? Walker Only recently began use of RW since last admission to hospital.  Prior Functional Level Prior Function Level of Independence: Independent. Did not drive. Lives in attached home to daughter's home. Daughter manages pillbox system. Daughter also has a caregiver pager if pt needs her assistance in the home. Pt cooked, cleaned, laundry , cared for her daughter's dogs when she was out of town, Social research officer, government.  Self Care: Did the patient need help bathing, dressing, using the toilet or eating?  Independent  Indoor  Mobility: Did the patient need assistance with walking from room to room (with or without device)? Independent  Stairs: Did the patient need assistance with internal or external stairs (with or without device)? Independent  Functional Cognition: Did the patient need help planning regular tasks  such as shopping or remembering to take medications? Independent  Current Functional Level Cognition  Overall Cognitive Status: Impaired/Different from baseline Orientation Level: Oriented X4 Following Commands: Follows one step commands with increased time Safety/Judgement: Decreased awareness of safety, Decreased awareness of deficits General Comments: pt requiring repetition of instructions     Extremity Assessment (includes Sensation/Coordination)  Upper Extremity Assessment: Generalized weakness (longstanding shoulder limitations due to arthritis)  Lower Extremity Assessment: Defer to PT evaluation RLE Deficits / Details: AAROM WFL, strength limited due to pain with lifting from hip, also educated to limit hip abduction to avoid increased pain LLE Deficits / Details: AROM WFL, strength hip flexion 4-/5, knee extension 4+/5    ADLs  Overall ADL's : Needs assistance/impaired Eating/Feeding: Set up, Sitting Grooming: Minimal assistance, Sitting, Wash/dry hands, Wash/dry face Upper Body Bathing: Moderate assistance, Sitting Lower Body Bathing: Total assistance, Sit to/from stand Upper Body Dressing : Minimal assistance, Sitting Lower Body Dressing: Total assistance, Sit to/from stand Toilet Transfer: Minimal assistance, Stand-pivot, BSC Toileting- Clothing Manipulation and Hygiene: Moderate assistance, Sit to/from stand    Mobility  Overal bed mobility: Needs Assistance Bed Mobility: Supine to Sit Supine to sit: HOB elevated, Min assist Sit to supine: Mod assist General bed mobility comments: pt in chair on arrival    Transfers  Overall transfer level: Needs assistance Equipment used: 1 person hand held assist, Rolling walker (2 wheeled) Transfers: Sit to/from Stand, Stand Pivot Transfers Sit to Stand: Min assist Stand pivot transfers: Min assist General transfer comment: cues and assist to scoot to edge of chair, cues for hands on knees, used momentum to stand from  [facility] and chair    Ambulation / Gait / Stairs / Wheelchair Mobility  Ambulation/Gait Ambulation/Gait assistance: Min assist, +2 safety/equipment Ambulation Distance (Feet): 35 Feet Assistive device: Rolling walker (2 wheeled) Gait Pattern/deviations: Step-through pattern, Decreased stride length, Trunk flexed General Gait Details: cues for posture and position in RW with chair to follow. Pt walked 35' and 30' with seated rest and chair follow Gait velocity: slow Gait velocity interpretation: Below normal speed for age/gender    Posture / Balance Dynamic Sitting Balance Sitting balance - Comments: close min guard to sit EOB Balance Overall balance assessment: Needs assistance Sitting-balance support: Feet supported Sitting balance-Leahy Scale: Fair Sitting balance - Comments: close min guard to sit EOB Standing balance support: Bilateral upper extremity supported Standing balance-Leahy Scale: Poor Standing balance comment: reliant on external supports    Special needs/care consideration BiPAP/CPAP  N/a CPM  N/a Continuous Drip IV  N/a Dialysis  N/a Life Vest  N/a Oxygen O2 at 2 liters [place]. Did not use oxygen pta Special Bed  N/a Trach Size  N/a Wound Vac n/a Skin   Registered Nurse Signed WOC  Consult Note Date of Service: 02/19/2017 4:07 PM      [] Hide copied text [] Hover for attribution information [place] Nurse wound consult note Reason for Consult: Patient is seen at the outpatient wound care center at [facility]. Lesions at the left great toe, lateral aspect and right foot, distal 2nd, 3rd and 4th digits are dry and stable. Wound type: arterial insufficiency Pressure Injury POA: No Measurement: Left great toe, lateral aspect: 0.8cm x 0.4cm with no depth.  Dried serum. Right foot: second, third and 4th digits, distal tips with dry eschars, the largest measures 0.4cm x 0.4cm round. Wound bed:dry, stable eschar present Drainage (amount, consistency, odor):  None. Periwound: Inact, dry. Right foot, 3rd digit is pale purple. Patient and her daughter state that this is not a new finding. Dressing procedure/placement/frequency: I have provided Nursing with guidance for daily cleansing of the bilateral LEs and for light application of Eucerin cream-except to the intra digital areas. We will pain the distal lesions at the tips of the left toes with a betadine swabstick and allow to air-dry to keep infection free and the escharotic lesions stable. Patient should continue to follow at the outpatient [facility] post discharge as indicated by the CVTS team. [facility] nursing team will not follow, but will remain available to this patient, the nursing and medical teams. Please re-consult if needed. Thanks, Maudie Flakes, MSN, RN, [place], Arther Abbott  Pager# [phone]     JP drain to left leg. Ecchymosis to face, shoulder and extremities. Feet with cracking skin bilaterally, surgical incisions. Bowel mgmt: LBM 02/20/17. continent Bladder mgmt: indwelling catheter removed 02/20/17. Now incontinent with urgency Diabetic mgmt  N/a Daughter reports cognition not at baseline due to Tramadol and hospitalization   Previous Home Environment Living Arrangements:  (lives with inlaw suite of daughter's hom esince 2005. 3 bedr)  Lives With: Alone (in law suite to daughter's home) Available Help at Discharge: Family, Available 24 hours/day (daughter works from her home) Type of Home: [place]: One level Home Access: Level entry Bathroom Shower/Tub: Multimedia programmer: Handicapped height Bathroom Accessibility: Yes [place]: No Additional Comments: pt's and daughter's homes are joined by a long sunrrom  Daughter reports their two homes are attached by sunroom. Separate homes. Daughter works from home and has a Manufacturing engineer to come assist if needed. Has never needed it.  Discharge Living Setting Plans for Discharge Living  Setting: Patient's home (in law suite) Type of Home at Discharge: House Discharge Home Layout: One level Discharge Home Access: Level entry Discharge Bathroom Shower/Tub: Walk-in shower Discharge Bathroom Toilet: Standard Discharge Bathroom Accessibility: Yes How Accessible: Accessible via walker Does the patient have any problems obtaining your medications?: No  Social/Family/Support Systems Patient Roles: Parent Contact Information: Otila Kluver, daughter Anticipated Caregiver: tina, daughter Anticipated Caregiver's Contact Information: see above Ability/Limitations of Caregiver: Otila Kluver works from home a a Tree surgeon Availability: 24/7 Discharge Plan Discussed with Primary Caregiver: Yes Is Caregiver In Agreement with Plan?: Yes Does Caregiver/Family have Issues with Lodging/Transportation while Pt is in Rehab?: No  Goals/Additional Needs Patient/Family Goal for Rehab: MOd I to superivison with PT, OT, and SLP Expected length of stay: [facility]OS 12-16 days Pt/Family Agrees to Admission and willing to participate: Yes Program Orientation Provided & Reviewed with Pt/Caregiver Including Roles  & Responsibilities: Yes  Decrease burden of Care through IP rehab admission: n/a  Possible need for SNF placement upon discharge:not anticipated  Patient Condition: This patient's condition remains as documented in the consult dated 02/20/2017, in which the Rehabilitation Physician determined and documented that the patient's condition is appropriate for intensive rehabilitative care in an inpatient rehabilitation facility. Will admit to inpatient rehab today.  Preadmission Screen Completed By:  Cleatrice Burke, 02/21/2017 12:04 PM ______________________________________________________________________   Discussed status with Dr. Naaman Plummer on 02/21/2017 at  44 and received telephone approval for admission today.  Admission Coordinator:  Cleatrice Burke, time 1200 Date  02/21/2017  Cosigned by: Meredith Staggers, MD at 02/21/2017 12:38 PM  Revision History

## 2017-02-21 NOTE — Evaluation (Signed)
Occupational Therapy Evaluation Patient Details Name: Lydia Jordan MRN: [id number] DOB: [birth year] Today's Date: 02/21/2017    History of Present Illness Pt is an 83 y.o. female with a history of chronic atrial fibrillation on ELIQUIS, recent non-STEMI with minimal CAD as well as hypertension and hypothyroidism who presented after mechanical fall on May 19. She suffered a pelvic fracture. Pt underwent a CT scan which revealed a type a aortic dissection complicated by pericardial effusion. Pt is now s/p CABG, aneurysm repair and drainage of pericardial effusion on 5/16.    Clinical Impression   Pt was independent at her baseline. Presents with poor activity tolerance, impaired cognition, generalized weakness and poor standing balance. Pt requires set up to total assist with ADL. She pivoted to chair/BSC with minimal assistance using momentum with assist to rise and steady. Pt with urinary incontinence x 2. Recommending intensive rehab upon d/c. Will follow acutely.    Follow Up Recommendations  CIR    Equipment Recommendations       Recommendations for Other Services       Precautions / Restrictions Precautions Precautions: Fall;Sternal Precaution Comments: able to state 1 sternal precaution Restrictions Weight Bearing Restrictions: No RLE Weight Bearing: Weight bearing as tolerated      Mobility Bed Mobility               General bed mobility comments: pt in chair on arrival  Transfers Overall transfer level: Needs assistance Equipment used: 1 person hand held assist;Rolling walker (2 wheeled) Transfers: Sit to/from [facility] Sit to Stand: Min assist Stand pivot transfers: Min assist       General transfer comment: cues and assist to scoot to edge of chair, cues for hands on knees, used momentum to stand from [facility] and chair    Balance Overall balance assessment: Needs assistance   Sitting balance-Leahy Scale: Fair       Standing  balance-Leahy Scale: Poor                             ADL either performed or assessed with clinical judgement   ADL Overall ADL's : Needs assistance/impaired Eating/Feeding: Set up;Sitting   Grooming: Minimal assistance;Sitting;Wash/dry hands;Wash/dry face   Upper Body Bathing: Moderate assistance;Sitting   Lower Body Bathing: Total assistance;Sit to/from stand   Upper Body Dressing : Minimal assistance;Sitting   Lower Body Dressing: Total assistance;Sit to/from stand   Toilet Transfer: Minimal assistance;Stand-pivot;BSC   Toileting- Clothing Manipulation and Hygiene: Moderate assistance;Sit to/from stand               Vision Patient Visual Report: No change from baseline       Perception     Praxis      Pertinent Vitals/Pain Pain Assessment: Faces Pain Score: 4  Faces Pain Scale: Hurts even more Pain Location: neck Pain Descriptors / Indicators: Sore Pain Intervention(s): Repositioned;Patient requesting pain meds-RN notified     Hand Dominance Right   Extremity/Trunk Assessment Upper Extremity Assessment Upper Extremity Assessment: Generalized weakness (longstanding shoulder limitations due to arthritis)   Lower Extremity Assessment Lower Extremity Assessment: Defer to PT evaluation   Cervical / Trunk Assessment Cervical / Trunk Assessment: Kyphotic   Communication Communication Communication: No difficulties   Cognition Arousal/Alertness: Awake/alert Behavior During Therapy: Flat affect Overall Cognitive Status: Impaired/Different from baseline Area of Impairment: Memory;Following commands;Safety/judgement;Problem solving  Memory: Decreased short-term memory;Decreased recall of precautions Following Commands: Follows one step commands with increased time Safety/Judgement: Decreased awareness of safety;Decreased awareness of deficits   Problem Solving: Slow processing;Decreased initiation;Difficulty  sequencing;Requires verbal cues;Requires tactile cues General Comments: pt requiring repetition of instructions    General Comments       Exercises General Exercises - Lower Extremity Long Arc Quad: AROM;Both;Seated;15 reps Hip Flexion/Marching: AROM;Both;Seated;10 reps   Shoulder Instructions      Home Living Family/patient expects to be discharged to:: Private residence Living Arrangements: Children Available Help at Discharge: Family;Available 24 hours/day (daughter is self employed) Type of Home: House Home Access: Level entry     Home Layout: One level     Bathroom Shower/Tub: Occupational psychologist: Handicapped height Bathroom Accessibility: Yes   Home Equipment: Environmental consultant - 4 wheels;Shower seat;Hand held shower head;Shower seat - built in;Grab bars - tub/shower          Prior Functioning/Environment Level of Independence: Independent                 OT Problem List: Decreased strength;Decreased activity tolerance;Impaired balance (sitting and/or standing);Decreased cognition;Decreased safety awareness;Decreased knowledge of use of DME or AE;Decreased knowledge of precautions;Cardiopulmonary status limiting activity;Pain;Impaired UE functional use      OT Treatment/Interventions: Self-care/ADL training;DME and/or AE instruction;Energy conservation;Cognitive remediation/compensation;Visual/perceptual remediation/compensation;Patient/family education;Balance training;Therapeutic activities    OT Goals(Current goals can be found in the care plan section) Acute Rehab OT Goals Patient Stated Goal: go home OT Goal Formulation: With patient Time For Goal Achievement: 03/07/17 Potential to Achieve Goals: Good ADL Goals Pt Will Perform Grooming: with min guard assist;standing (3 activities) Pt Will Perform Upper Body Dressing: with supervision;sitting Pt Will Perform Lower Body Dressing: with min assist;sit to/from stand Pt Will Transfer to Toilet: with  supervision;ambulating;bedside commode (over toilet) Pt Will Perform Toileting - Clothing Manipulation and hygiene: with supervision;sit to/from stand Additional ADL Goal #1: Pt will generalize sternal precautions with minimal cues.  OT Frequency: Min 2X/week   Barriers to D/C:            Co-evaluation              AM-PAC PT "6 Clicks" Daily Activity     Outcome Measure Help from another person eating meals?: A Little Help from another person taking care of personal grooming?: A Little Help from another person toileting, which includes using toliet, bedpan, or urinal?: A Lot Help from another person bathing (including washing, rinsing, drying)?: A Lot Help from another person to put on and taking off regular upper body clothing?: A Little Help from another person to put on and taking off regular lower body clothing?: Total 6 Click Score: 14   End of Session Equipment Utilized During Treatment: Gait belt;Rolling walker Nurse Communication: Patient requests pain meds  Activity Tolerance: Patient limited by fatigue Patient left: in chair;with call bell/phone within reach;with family/visitor present  OT Visit Diagnosis: Unsteadiness on feet (R26.81);Muscle weakness (generalized) (M62.81);History of falling (Z91.81);Other symptoms and signs involving cognitive function;Pain                Time: 5397-6734 OT Time Calculation (min): 32 min Charges:  OT General Charges $OT Visit: 1 Procedure OT Evaluation $OT Eval Moderate Complexity: 1 Procedure OT Treatments $Self Care/Home Management : 8-22 mins G-Codes:     Malka So 02/21/2017, 10:22 AM  [phone]

## 2017-02-22 ENCOUNTER — Telehealth (HOSPITAL_COMMUNITY): Payer: Self-pay

## 2017-02-22 ENCOUNTER — Inpatient Hospital Stay (HOSPITAL_COMMUNITY): Payer: Medicare Other | Admitting: Speech Pathology

## 2017-02-22 ENCOUNTER — Inpatient Hospital Stay (HOSPITAL_COMMUNITY): Payer: Medicare Other | Admitting: Physical Therapy

## 2017-02-22 ENCOUNTER — Inpatient Hospital Stay (HOSPITAL_COMMUNITY): Payer: Medicare Other | Admitting: Occupational Therapy

## 2017-02-22 ENCOUNTER — Inpatient Hospital Stay (HOSPITAL_COMMUNITY): Payer: Medicare Other

## 2017-02-22 DIAGNOSIS — I2510 Atherosclerotic heart disease of native coronary artery without angina pectoris: Secondary | ICD-10-CM

## 2017-02-22 DIAGNOSIS — Z952 Presence of prosthetic heart valve: Secondary | ICD-10-CM

## 2017-02-22 DIAGNOSIS — I5031 Acute diastolic (congestive) heart failure: Secondary | ICD-10-CM

## 2017-02-22 DIAGNOSIS — S329XXS Fracture of unspecified parts of lumbosacral spine and pelvis, sequela: Secondary | ICD-10-CM

## 2017-02-22 LAB — CULTURE, BLOOD (ROUTINE X 2)
CULTURE: NO GROWTH
Culture: NO GROWTH
SPECIAL REQUESTS: ADEQUATE
Special Requests: ADEQUATE

## 2017-02-22 LAB — COMPREHENSIVE METABOLIC PANEL
ALK PHOS: 94 U/L (ref 38–126)
ALT: 20 U/L (ref 14–54)
AST: 24 U/L (ref 15–41)
Albumin: 2.4 g/dL — ABNORMAL LOW (ref 3.5–5.0)
Anion gap: 8 (ref 5–15)
BUN: 16 mg/dL (ref 6–20)
CALCIUM: 8.5 mg/dL — AB (ref 8.9–10.3)
CO2: 30 mmol/L (ref 22–32)
CREATININE: 0.64 mg/dL (ref 0.44–1.00)
Chloride: 91 mmol/L — ABNORMAL LOW (ref 101–111)
Glucose, Bld: 103 mg/dL — ABNORMAL HIGH (ref 65–99)
Potassium: 4.4 mmol/L (ref 3.5–5.1)
Sodium: 129 mmol/L — ABNORMAL LOW (ref 135–145)
Total Bilirubin: 1.2 mg/dL (ref 0.3–1.2)
Total Protein: 5.7 g/dL — ABNORMAL LOW (ref 6.5–8.1)

## 2017-02-22 LAB — CBC WITH DIFFERENTIAL/PLATELET
Basophils Absolute: 0 10*3/uL (ref 0.0–0.1)
Basophils Relative: 0 %
Eosinophils Absolute: 0.5 10*3/uL (ref 0.0–0.7)
Eosinophils Relative: 3 %
HEMATOCRIT: 32.3 % — AB (ref 36.0–46.0)
HEMOGLOBIN: 10.2 g/dL — AB (ref 12.0–15.0)
LYMPHS ABS: 1.4 10*3/uL (ref 0.7–4.0)
LYMPHS PCT: 8 %
MCH: 28.8 pg (ref 26.0–34.0)
MCHC: 31.6 g/dL (ref 30.0–36.0)
MCV: 91.2 fL (ref 78.0–100.0)
Monocytes Absolute: 2.3 10*3/uL — ABNORMAL HIGH (ref 0.1–1.0)
Monocytes Relative: 12 %
NEUTROS PCT: 77 %
Neutro Abs: 14.7 10*3/uL — ABNORMAL HIGH (ref 1.7–7.7)
Platelets: 365 10*3/uL (ref 150–400)
RBC: 3.54 MIL/uL — AB (ref 3.87–5.11)
RDW: 14.5 % (ref 11.5–15.5)
WBC: 18.9 10*3/uL — ABNORMAL HIGH (ref 4.0–10.5)

## 2017-02-22 LAB — URINALYSIS, ROUTINE W REFLEX MICROSCOPIC
BILIRUBIN URINE: NEGATIVE
Glucose, UA: NEGATIVE mg/dL
HGB URINE DIPSTICK: NEGATIVE
KETONES UR: NEGATIVE mg/dL
Leukocytes, UA: NEGATIVE
NITRITE: NEGATIVE
Protein, ur: NEGATIVE mg/dL
Specific Gravity, Urine: 1.015 (ref 1.005–1.030)
pH: 8 (ref 5.0–8.0)

## 2017-02-22 MED ORDER — FUROSEMIDE 10 MG/ML IJ SOLN
60.0000 mg | Freq: Two times a day (BID) | INTRAMUSCULAR | Status: DC
  Administered 2017-02-22 – 2017-02-23 (×2): 60 mg via INTRAVENOUS
  Filled 2017-02-22 (×2): qty 6

## 2017-02-22 NOTE — Discharge Instructions (Addendum)
Inpatient Rehab Discharge Instructions  Lydia Jordan Discharge date and time:    Activities/Precautions/ Functional Status: Activity: no lifting, driving, or strenuous exercise for till cleared by MD. Continue to maintain sternal precautions.  Diet: cardiac diet Wound Care: keep wound clean and dry. Contact MD if you develop any problems with your incision/wound--redness, swelling, increase in pain, drainage or if you develop fever or chills.   Functional status:  ___ No restrictions     ___ Walk up steps independently _X__ 24/7 supervision/assistance   ___ Walk up steps with assistance ___ Intermittent supervision/assistance  ___ Bathe/dress independently ___ Walk with walker     __X_ Bathe/dress with assistance ___ Walk Independently    ___ Shower independently _X__ Walk with assistance    ___ Shower with assistance _X__ No alcohol     ___ Return to work/school ________  Special Instructions:    My questions have been answered and I understand these instructions. I will adhere to these goals and the provided educational materials after my discharge from the hospital.  Patient/Caregiver Signature _______________________________ Date __________  Clinician Signature _______________________________________ Date __________  Please bring this form and your medication list with you to all your follow-up doctor's appointments. Information on my medicine - ELIQUIS (apixaban)  This medication education was reviewed with me or my healthcare representative as part of my discharge preparation.   Why was Eliquis prescribed for you? Eliquis was prescribed for you to reduce the risk of forming blood clots that can cause a stroke if you have a medical condition called atrial fibrillation (a type of irregular heartbeat) OR to reduce the risk of a blood clots forming after orthopedic surgery.  What do You need to know about Eliquis ? Take your Eliquis 2.5 mg TWICE DAILY - one  tablet in the morning and one tablet in the evening with or without food.  It would be best to take the doses about the same time each day.  If you have difficulty swallowing the tablet whole please discuss with your pharmacist how to take the medication safely.  Take Eliquis exactly as prescribed by your doctor and DO NOT stop taking Eliquis without talking to the doctor who prescribed the medication.  Stopping may increase your risk of developing a new clot or stroke.  Refill your prescription before you run out.  After discharge, you should have regular check-up appointments with your healthcare provider that is prescribing your Eliquis.  In the future your dose may need to be changed if your kidney function or weight changes by a significant amount or as you get older.  What do you do if you miss a dose? If you miss a dose, take it as soon as you remember on the same day and resume taking twice daily.  Do not take more than one dose of ELIQUIS at the same time.  Important Safety Information A possible side effect of Eliquis is bleeding. You should call your healthcare provider right away if you experience any of the following: ? Bleeding from an injury or your nose that does not stop. ? Unusual colored urine (red or dark brown) or unusual colored stools (red or black). ? Unusual bruising for unknown reasons. ? A serious fall or if you hit your head (even if there is no bleeding).  Some medicines may interact with Eliquis and might increase your risk of bleeding or clotting while on Eliquis. To help avoid this, consult your healthcare provider or pharmacist  prior to using any new prescription or non-prescription medications, including herbals, vitamins, non-steroidal anti-inflammatory drugs (NSAIDs) and supplements.  This website has more information on Eliquis (apixaban): [email or web address].

## 2017-02-22 NOTE — Evaluation (Signed)
Occupational Therapy Assessment and Plan  Patient Details  Name: Lydia Jordan MRN: [id number] Date of Birth: 12/06/1933  OT Diagnosis: cognitive deficits and muscle weakness (generalized) Rehab Potential: Rehab Potential (ACUTE ONLY): Good ELOS: 17-21 days   Today's Date: 02/22/2017 OT Individual Time: 0800-0900 OT Individual Time Calculation (min): 60 min     Problem List:  Patient Active Problem List   Diagnosis Date Noted  . Debility 02/21/2017  . Acute on chronic diastolic heart failure ([place])   . Chronic diastolic congestive heart failure ([place])   . S/P AVR   . Dysphagia   . Tachypnea   . Hypokalemia   . Leukocytosis   . S/P ascending aortic dissection repair 02/15/2017  . 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 S/P CABG x 1 02/15/2017  . Pubic ramus fracture, right, closed, initial encounter ([place]) 02/12/2017  . Chronic pain   . Cervical disc disease   . Degenerative arthritis   . Physical deconditioning   . Fall   . Closed fracture of multiple pubic rami ([place])   . Anemia   . Hydronephrosis of right kidney   . Ascending aortic dissection ([place]) 02/11/2017  . Pericardial effusion 02/11/2017  . S/P cardiac cath: (2018) a. mild to moderate disease but no flow limiting lesions with perserved EF. b. large-caliber almost ectatic vessels for patients age. 01/12/2017  . Hypertension 01/12/2017  . CAD (coronary artery disease): mild to moderate non-obstructive 01/12/2017  . Epistaxis: (2018) cauterized by Dr. Erik Obey 40/98/1191  . Longstanding persistent atrial fibrillation ([place])   . Ascending aortic aneurysm ([place]) 01/11/2017  . NSTEMI (non-ST elevated myocardial infarction) ([place]) 01/10/2017    Past Medical History:  Past Medical History:  Diagnosis Date  . Acid reflux   . Arthritis   . Ascending aortic aneurysm ([place]) 01/11/2017  . Ascending aortic dissection ([place]) 02/11/2017  . CAD (coronary artery disease)    Cath 01/11/17  showed 50% ost RCA and 50% mLAD --> medical therapy   . Cervical disc disease   . Chronic diastolic congestive heart failure ([place])   . Chronic pain    Secondary to degenerative disc disease and arthritis  . Degenerative arthritis   . Hydronephrosis of right kidney   . Hypertension   . Hypothyroidism   . Hypothyroidism   . Longstanding persistent atrial fibrillation ([place])    originally diagnosed 2007  . NSTEMI (non-ST elevated myocardial infarction) ([place]) 01/10/2017  . Pubic ramus fracture, right, closed, initial encounter ([place]) 02/12/2017  . S/P ascending aortic dissection repair 02/15/2017   Straight graft replacement of sub-acute ascending aortic dissection with hemi-arch distal aortic reconstruction  . 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 AORTIC VALVE REPLACEMENT N/A 02/15/2017   Procedure: AORTIC VALVE REPLACEMENT (AVR);  Surgeon: [name], MD;  Location: [facility];  Service: Open Heart Surgery;  Laterality: N/A;  . BENTALL PROCEDURE N/A 02/15/2017   Procedure: BENTALL AORTIC ROOT REPLACEMENT PROCEDURE, REPAIR ASCENDING AORTIC DISSECTION;  Surgeon: [name], MD;  Location: [facility];  Service: Open Heart Surgery;  Laterality: N/A;  . CLIPPING OF ATRIAL APPENDAGE Left 02/15/2017   Procedure: CLIPPING OF ATRIAL APPENDAGE;  Surgeon: [name], MD;  Location: [facility];  Service: Open Heart Surgery;  Laterality: Left;  . CORONARY ARTERY BYPASS GRAFT N/A 02/15/2017   Procedure: CORONARY ARTERY BYPASS GRAFTING (CABG) x  one, using left saphenous vein;  Surgeon: [name], MD;  [facility] OR;  Service: Vascular;  Laterality: N/A;  . ESOPHAGEAL MANOMETRY N/A  07/31/2015   Procedure: ESOPHAGEAL MANOMETRY (EM);  Surgeon: Arta Silence, MD;  Location: [facility];  Service: Endoscopy;  Laterality: N/A;  . ESOPHAGOGASTRODUODENOSCOPY (EGD) WITH PROPOFOL N/A 09/02/2015   Procedure: ESOPHAGOGASTRODUODENOSCOPY (EGD) WITH PROPOFOL;  Surgeon: Arta Silence, MD;  Location: [facility];  Service: Endoscopy;  Laterality: N/A;  botulinum toxin injection (100 Units; 25 units in 4 quadrants 1-2 cm proximal to GE junction  . LEFT HEART CATH AND CORONARY ANGIOGRAPHY N/A 01/11/2017   Procedure: Left Heart Cath and Coronary Angiography;  Surgeon: Leonie Man, MD;  Location: [facility];  Service: Cardiovascular;  Laterality: N/A;  . PERICARDIAL FLUID DRAINAGE  02/15/2017   Procedure: DRAINAGE OF PERICARDIAL FLUID;  Surgeon: [name], MD;  Location: [facility];  Service: Vascular;;  . skin cancer area removed      right leg healing well  . TEE WITHOUT CARDIOVERSION N/A 02/15/2017   Procedure: TRANSESOPHAGEAL ECHOCARDIOGRAM (TEE);  Surgeon: [name], MD;  Location: [facility];  Service: Open Heart Surgery;  Laterality: N/A;  . VAGINAL HYSTERECTOMY      Assessment & Plan Clinical Impression: Patient is a 83 y.o. year old female with history of A fib, recent NSTEMI, non-obstructive CAD, ascending aortic aneurysm, achalasia, DJD/DDD with chronic pain, multiple recent falls who presented to ED 02/11/17 with reports of recent fall with hip pain and inability to walk. X rays revealed right pubic rami fracture, chronic right UPJ obstruction with moderate to severe hydronephrosis as well as type A aortic dissection. She was evaluated by Dr. Roxy Manns and was cleared to undergo repair of aortic dissection with AVR, drainage of pericardial fluid and CABG X 1 on 02/15/17. to be WBAT per Dr. Fredonia Highland. Post op on milironone for fluid overload and on low dose Lovenox for A fib. She has had reports of dysphagia and MBS done functional swallow with high penetration of thin  liquids occasionally and modified diet recommended by ST.  Patient transferred to CIR on 02/21/2017 .    Patient currently requires max with basic self-care skills secondary to muscle weakness, decreased cardiorespiratoy endurance and decreased oxygen support, decreased coordination and decreased motor planning, decreased initiation, decreased attention, decreased awareness, decreased problem solving, decreased safety awareness, decreased memory and delayed processing,  and decreased sitting balance, decreased standing balance, decreased balance strategies and difficulty maintaining precautions.  Prior to hospitalization, patient could complete BADL  independent .  Patient will benefit from skilled intervention to decrease level of assist with basic self-care skills prior to discharge home with care partner.  Anticipate patient will require 24 hour supervision and follow up home health.  OT - End of Session Endurance Deficit: Yes Endurance Deficit Description: Pt required multiple rest breaks during BAD session  OT Assessment Rehab Potential (ACUTE ONLY): Good Barriers to Discharge: Decreased caregiver support Barriers to Discharge Comments: Daughter available at mornings and nights only at this timr OT Patient demonstrates impairments in the following area(s): Balance;Cognition;Endurance;Motor;Perception;Pain;Safety OT Basic ADL's Functional Problem(s): Eating;Grooming;Dressing;Toileting;Bathing OT Transfers Functional Problem(s): Toilet OT Additional Impairment(s): None OT Plan OT Intensity: Minimum of 1-2 x/day, 45 to 90 minutes OT Frequency: 5 out of 7 days OT Duration/Estimated Length  of Stay: 17-21 days OT Treatment/Interventions: Balance/vestibular training;Cognitive remediation/compensation;Community reintegration;Discharge planning;DME/adaptive equipment instruction;Functional mobility training;Pain management;Patient/family education;Self Care/advanced ADL retraining;Therapeutic  Activities;Therapeutic Exercise;UE/LE Strength taining/ROM;UE/LE Coordination activities OT Self Feeding Anticipated Outcome(s): Mod I OT Basic Self-Care Anticipated Outcome(s): Supervision OT Toileting Anticipated Outcome(s): Supervision OT Bathroom Transfers Anticipated Outcome(s): Supervision OT Recommendation Patient destination: Home Follow Up Recommendations: Home health OT Equipment Recommended: To be determined   Skilled Therapeutic Intervention Initial eval completed with treatment provided to address functional transfers, sternal precautions, sit<>stand, and adapted bathing/dressing skills. Pt wheezing and disoriented upon OT arrival. Pt w/ delayed response to commands, but eventually able to state she was at [facility], and state her name. SpO2 98 on 1.5 L at rest. W/ HOB elevated, pt transferred to sitting EOB with Mod A. Pt progressed from min A to supervision for sitting balance, pt desat to 72 on 1.5 L s/p activity- pt bumped to 2.5 L with pursed lip breathing techniques and able to recover to above 93. Reviewed sternal precautions with pt. Then stand-pivot transfer with Max A lift and lower, and mod A to pivot with VC for step.  W/c level bathing/dressing at the sink with overall max A and max instrutcional cues for sequencing and initiation of bathing/dressing tasks.  S   OT Evaluation Precautions/Restrictions  Precautions Precautions: Fall;Sternal Restrictions Weight Bearing Restrictions: Yes RLE Weight Bearing: Weight bearing as tolerated Pain Pain Assessment Pain Assessment: No/denies pain Home Living/Prior Functioning Home Living Family/patient expects to be discharged to:: Private residence Living Arrangements: Children Available Help at Discharge: Available PRN/intermittently (per daughter, she works out of the house during the day and may have trouble arranging care during the day time if needed) Type of Home: House Home Access: Stairs to enter [employer]  of Steps: 1 Entrance Stairs-Rails: None Home Layout: One level (1-step down into sunroom) Bathroom Shower/Tub: Multimedia programmer: Handicapped height Bathroom Accessibility: Yes Additional Comments: pt's and daughter's homes are joined by a long sunrrom  Lives With: Alone IADL History Current License: No Prior Function Level of Independence: Independent with transfers, Independent with gait (had briefly used a RW after previous admission but had progressed to no AD)  Able to Take Stairs?: Yes Driving: No Vocation: Retired ADL ADL ADL Comments: Please see functional navigator Vision Baseline Vision/History: Wears glasses Wears Glasses: At all times Patient Visual Report: No change from baseline Perception  Perception: Impaired Comments: possible L inattention, to be further assessed Praxis Praxis: Impaired Praxis Impairment Details: Initiation Cognition Overall Cognitive Status: Impaired/Different from baseline Arousal/Alertness: Lethargic Orientation Level: Person;Place (delayed responses) Person: Oriented Place: Oriented Situation: Disoriented (unable to state) Year: 2018 Month: February Day of Week: Incorrect Memory: Impaired Immediate Memory Recall: Sock;Bed;Blue Memory Recall: Blue Memory Recall Blue: Without Cue Attention: Focused;Sustained Focused Attention: Appears intact Sustained Attention: Impaired Sustained Attention Impairment: Verbal basic;Functional basic Awareness: Impaired Awareness Impairment: Intellectual impairment Problem Solving: Impaired Problem Solving Impairment: Functional basic Safety/Judgment: Impaired Comments: Patient very lethargic throughout evaluation and was falling asleep mid sentence. RN aware and reported she fel it was due to amount of pain medication received this morning.  Sensation Sensation Light Touch: Appears Intact Coordination Gross Motor Movements are Fluid and Coordinated: Yes Fine Motor Movements are  Fluid and Coordinated: No Finger Nose Finger Test: overhooting with B UE's-difficulty following 2 step comnmand to complete accurately Motor  Motor Motor: Abnormal postural alignment and control;Motor apraxia Mobility  Bed Mobility Bed Mobility: Rolling Right;Right Sidelying to Sit Rolling Right: 3: Mod assist Right Sidelying to  Sit: 3: Mod assist Transfers Sit to Stand: 2: Max assist Stand to Sit: 2: Max assist  Trunk/Postural Assessment  Cervical Assessment Cervical Assessment: Within Functional Limits Thoracic Assessment Thoracic Assessment: Exceptions to [facility] (rounded shoulders) Lumbar Assessment Lumbar Assessment: Within Functional Limits Postural Control Postural Control: Within Functional Limits  Balance Balance Balance Assessed: Yes Static Sitting Balance Static Sitting - Balance Support: Left upper extremity supported;Right upper extremity supported;Feet supported Static Sitting - Level of Assistance: 5: Stand by assistance Static Standing Balance Static Standing - Balance Support: During functional activity Static Standing - Level of Assistance: 3: Mod assist Extremity/Trunk Assessment RUE Assessment RUE Assessment: Exceptions to [facility] (limited assessment 2/2 sternal precautions 0-80 shoulder FF) LUE Assessment LUE Assessment: Exceptions to [facility] (limited assessment 2/2 sternal precautions- 0-80 shoulder FF)   See Function Navigator for Current Functional Status.   Refer to Care Plan for Long Term Goals  Recommendations for other services: Neuropsych   Discharge Criteria: Patient will be discharged from OT if patient refuses treatment 3 consecutive times without medical reason, if treatment goals not met, if there is a change in medical status, if patient makes no progress towards goals or if patient is discharged from hospital.  The above assessment, treatment plan, treatment alternatives and goals were discussed and mutually agreed upon: by patient  Valma Cava 02/22/2017, 4:34 PM

## 2017-02-22 NOTE — Patient Care Conference (Signed)
Inpatient RehabilitationTeam Conference and Plan of Care Update Date: 02/22/2017   Time: 11:25 AM    Patient Name: Lydia Jordan      Medical Record Number: [id number]  Date of Birth: 1934-08-21 Sex: Female         Room/Bed: 4M02C/4M02C-01 Payor Info: Payor: MEDICARE / Plan: MEDICARE PART A AND B / Product Type: *No Product type* /    Admitting Diagnosis: fall with right puic rami fracture   Admit Date/Time:  02/21/2017  3:34 PM Admission Comments: No comment available   Primary Diagnosis:  Physical deconditioning Principal Problem: Physical deconditioning  Patient Active Problem List   Diagnosis Date Noted  . Debility 02/21/2017  . Acute on chronic diastolic heart failure ([place])   . Chronic diastolic congestive heart failure ([place])   . S/P AVR   . Dysphagia   . Tachypnea   . Hypokalemia   . Leukocytosis   . S/P ascending aortic dissection repair 02/15/2017  . 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 S/P CABG x 1 02/15/2017  . Pubic ramus fracture, right, closed, initial encounter ([place]) 02/12/2017  . Chronic pain   . Cervical disc disease   . Degenerative arthritis   . Physical deconditioning   . Fall   . Closed fracture of multiple pubic rami ([place])   . Anemia   . Hydronephrosis of right kidney   . Ascending aortic dissection ([place]) 02/11/2017  . Pericardial effusion 02/11/2017  . S/P cardiac cath: (2018) a. mild to moderate disease but no flow limiting lesions with perserved EF. b. large-caliber almost ectatic vessels for patients age. 01/12/2017  . Hypertension 01/12/2017  . CAD (coronary artery disease): mild to moderate non-obstructive 01/12/2017  . Epistaxis: (2018) cauterized by Dr. Erik Obey 02/40/9735  . Longstanding persistent atrial fibrillation ([place])   . Ascending aortic aneurysm ([place]) 01/11/2017  . NSTEMI (non-ST elevated myocardial infarction) ([place]) 01/10/2017    Expected Discharge Date:    Team Members  Present: Physician leading conference: Dr. Alysia Penna Social Worker Present: Ovidio Kin, LCSW Nurse Present: Other (comment) Alda Lea) PT Present: Dwyane Dee, PT OT Present: Cherylynn Ridges, OT SLP Present: Weston Anna, SLP PPS Coordinator present : Daiva Nakayama, RN, CRRN     Current Status/Progress Goal Weekly Team Focus  Medical     chest x-ray and UA checking   medical stability     Bowel/Bladder   incontinent at times/ [facility] 02/19/17 /UA will be collected this AM  remain continent of bowel and bladder  monitor bowel and bladder q shift   Swallow/Nutrition/ Hydration   Dys. 3 textures with thin liquids, Intermittent supervision   Mod I  use of swallowing compensatory strategies    ADL's   Max A overall  Min A-supervision  cognition, awareness, sternal precautions, transfers, modified bathing/dressing   Mobility   mod/max for all mobility, cognitively impaired limiting ability to follow 1-step commands consistently or attend to functional tasks without max/total cues  supervision overall  cognitive remediation, functional mobility, recall of sternal precautions, balance, activity tolerance   Communication             Safety/Cognition/ Behavioral Observations  Max A  Supervision   attention, problem solving, recall    Pain   chronic neck pain/tylenol 650mg /Pain to right arm, leg  pain less than or equal to 2  monitor pain q shif   Skin   bruising to right side sde, forehead, arm, leg/ dressing change to  left leg  incisions/ paint with betadine cover with dry dressing  no new skin breakdown this admission/infection  monitor skin q shift    Rehab Goals Patient on target to meet rehab goals: Yes *See Care Plan and progress notes for long and short-term goals.  Barriers to Discharge:   Medical issues-adjusting meds and checking chest x-ray   Possible Resolutions to Barriers:    resolve by dc   Discharge Planning/Teaching Needs:    Home with daughter  who works form home but at times does need to leave for appointments. Will await therapy goals to see what she will need to do or set up.     Team Discussion:  New eval therapy team feeling will be 2.5-3 weeks length of stay-goals not set at this time, will need to see if pt clears cognitively. Chest x-ray and UA done per MD for increased white count. Daughter here today to observe in therapies.  Revisions to Treatment Plan:  New eval      Elease Hashimoto 02/13/2017, 8:59 AM

## 2017-02-22 NOTE — Telephone Encounter (Signed)
Verified Medicare A/B & AARP insurance benefits through Passport Reference # [id number] & [id number].... KJ

## 2017-02-22 NOTE — Evaluation (Signed)
Speech Language Pathology Assessment and Plan  Patient Details  Name: Lydia Jordan MRN: [id number] Date of Birth: 11/27/1933  SLP Diagnosis: Cognitive Impairments;Dysphagia  Rehab Potential: Good ELOS: 2.5-3 weeks     Today's Date: 02/22/2017 SLP Individual Time: 4098-1191 SLP Individual Time Calculation (min): 55 min   Problem List:  Patient Active Problem List   Diagnosis Date Noted  . Debility 02/21/2017  . Acute on chronic diastolic heart failure ([place])   . Chronic diastolic congestive heart failure ([place])   . S/P AVR   . Dysphagia   . Tachypnea   . Hypokalemia   . Leukocytosis   . S/P ascending aortic dissection repair 02/15/2017  . 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 S/P CABG x 1 02/15/2017  . Pubic ramus fracture, right, closed, initial encounter ([place]) 02/12/2017  . Chronic pain   . Cervical disc disease   . Degenerative arthritis   . Physical deconditioning   . Fall   . Closed fracture of multiple pubic rami ([place])   . Anemia   . Hydronephrosis of right kidney   . Ascending aortic dissection ([place]) 02/11/2017  . Pericardial effusion 02/11/2017  . S/P cardiac cath: (2018) a. mild to moderate disease but no flow limiting lesions with perserved EF. b. large-caliber almost ectatic vessels for patients age. 01/12/2017  . Hypertension 01/12/2017  . CAD (coronary artery disease): mild to moderate non-obstructive 01/12/2017  . Epistaxis: (2018) cauterized by Dr. Erik Obey [id number]/9562  . Longstanding persistent atrial fibrillation ([place])   . Ascending aortic aneurysm ([place]) 01/11/2017  . NSTEMI (non-ST elevated myocardial infarction) ([place]) 01/10/2017   Past Medical History:  Past Medical History:  Diagnosis Date  . Acid reflux   . Arthritis   . Ascending aortic aneurysm ([place]) 01/11/2017  . Ascending aortic dissection ([place]) 02/11/2017  . CAD (coronary artery disease)    Cath 01/11/17 showed 50% ost RCA and 50% mLAD --> medical  therapy   . Cervical disc disease   . Chronic diastolic congestive heart failure ([place])   . Chronic pain    Secondary to degenerative disc disease and arthritis  . Degenerative arthritis   . Hydronephrosis of right kidney   . Hypertension   . Hypothyroidism   . Hypothyroidism   . Longstanding persistent atrial fibrillation ([place])    originally diagnosed 2007  . NSTEMI (non-ST elevated myocardial infarction) ([place]) 01/10/2017  . Pubic ramus fracture, right, closed, initial encounter ([place]) 02/12/2017  . S/P ascending aortic dissection repair 02/15/2017   Straight graft replacement of sub-acute ascending aortic dissection with hemi-arch distal aortic reconstruction  . 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 AORTIC VALVE REPLACEMENT N/A 02/15/2017   Procedure: AORTIC VALVE REPLACEMENT (AVR);  Surgeon: [name], MD;  Location: [facility];  Service: Open Heart Surgery;  Laterality: N/A;  . BENTALL PROCEDURE N/A 02/15/2017   Procedure: BENTALL AORTIC ROOT REPLACEMENT PROCEDURE, REPAIR ASCENDING AORTIC DISSECTION;  Surgeon: [name], MD;  Location: [facility];  Service: Open Heart Surgery;  Laterality: N/A;  . CLIPPING OF ATRIAL APPENDAGE Left 02/15/2017   Procedure: CLIPPING OF ATRIAL APPENDAGE;  Surgeon: [name], MD;  Location: [place] [facility];  Service: Open  Heart Surgery;  Laterality: Left;  . CORONARY ARTERY BYPASS GRAFT N/A 02/15/2017   Procedure: CORONARY ARTERY BYPASS GRAFTING (CABG) x  one, using left saphenous vein;  Surgeon: [name], MD;  Location: [facility];  Service: Vascular;  Laterality: N/A;  . ESOPHAGEAL MANOMETRY N/A 07/31/2015   Procedure: ESOPHAGEAL MANOMETRY (EM);   Surgeon: Arta Silence, MD;  Location: [facility];  Service: Endoscopy;  Laterality: N/A;  . ESOPHAGOGASTRODUODENOSCOPY (EGD) WITH PROPOFOL N/A 09/02/2015   Procedure: ESOPHAGOGASTRODUODENOSCOPY (EGD) WITH PROPOFOL;  Surgeon: Arta Silence, MD;  Location: [facility];  Service: Endoscopy;  Laterality: N/A;  botulinum toxin injection (100 Units; 25 units in 4 quadrants 1-2 cm proximal to GE junction  . LEFT HEART CATH AND CORONARY ANGIOGRAPHY N/A 01/11/2017   Procedure: Left Heart Cath and Coronary Angiography;  Surgeon: Leonie Man, MD;  Location: [facility];  Service: Cardiovascular;  Laterality: N/A;  . PERICARDIAL FLUID DRAINAGE  02/15/2017   Procedure: DRAINAGE OF PERICARDIAL FLUID;  Surgeon: [name], MD;  Location: [facility];  Service: Vascular;;  . skin cancer area removed      right leg healing well  . TEE WITHOUT CARDIOVERSION N/A 02/15/2017   Procedure: TRANSESOPHAGEAL ECHOCARDIOGRAM (TEE);  Surgeon: [name], MD;  Location: [facility];  Service: Open Heart Surgery;  Laterality: N/A;  . VAGINAL HYSTERECTOMY      Assessment / Plan / Recommendation Clinical Impression Patient is a 83 y.o. female with history of A fib, recent NSTEMI, non-obstructive CAD, ascending aortic aneurysm, achalasia, DJD/DDD with chronic pain, multiple recent falls who presented to ED 02/11/17 with reports of recent fall with hip pain and inability to walk. X rays revealed right pubic rami fracture, chronic right UPJ obstruction with moderate to severe hydronephrosis as well as type A aortic dissection. She was evaluated by Dr. Roxy Jordan and was cleared to undergo repair of aortic dissection with AVR, drainage of pericardial fluid and CABG X 1 on 02/15/17. to be WBAT per Dr. Fredonia Highland. Post op on milironone for fluid overload and on low dose Lovenox for A fib. She has had reports of dysphagia and MBS done functional swallow with high penetration of thin liquids occasionally and modified diet recommended  by ST. She was advanced to dysphagia 3 per patient request. CXR reviewed 5/21, stable changes. Therapy ongoing and patient limited by SOB and fatigue. Patient with significant deficits in mobility and ability to carry out ADL tasks. CIR recommended for follow up therapy. Independent PTA--has started using AD recently due to multiple falls. Sister and daughter supportive and can assist as needed after discharge.   Patient was administered a cognitive-linguistic evaluation and demonstrates severe cognitive impairments characterized by impaired orientation, sustained attention, ability to follow commands, initiation, functional problem solving, recall and awareness. Patient also demonstrated difficulty with word-finding and thought organization. Patient's daughter present and reported she felt the patient's cognitive-linguistic function has declined within the past 2 days. RN/PA aware. Patient consumed thin liquids via straw without overt s/s of aspiration and medications whole with liquids without difficulty. Solids were not attempted today due to decreased arousal and lethargy. Recommend patient continue current diet. Patient would benefit from skilled SLP intervention to maximize her cognitive-linguistic and swallowing function and overall functional independence prior to discharge. Patient [facility] 02/21/17.    Skilled Therapeutic Interventions          Administered a cognitive-linguistic evaluation and BSE. Please see above for details.   SLP Assessment  Patient will  need skilled Speech Lanaguage Pathology Services during CIR admission    Recommendations  Recommended Consults: Consider esophageal assessment SLP Diet Recommendations: Dysphagia 3 (Mech soft);Thin Liquid Administration via: Straw;Cup Medication Administration: Whole meds with liquid Supervision: Patient able to self feed;Full supervision/cueing for compensatory strategies Compensations: Slow rate;Small sips/bites;Follow solids  with liquid Postural Changes and/or Swallow Maneuvers: Seated upright 90 degrees;Upright 30-60 min after meal Oral Care Recommendations: Oral care BID Recommendations for Other Services: Neuropsych consult Patient destination: Home Follow up Recommendations: 24 hour supervision/assistance;Home Health SLP;Outpatient SLP Equipment Recommended: None recommended by SLP    SLP Frequency 3 to 5 out of 7 days   SLP Duration  SLP Intensity  SLP Treatment/Interventions 2.5-3 weeks   Minumum of 1-2 x/day, 30 to 90 minutes  Cognitive remediation/compensation;Cueing hierarchy;Environmental controls;Dysphagia/aspiration precaution training;Internal/external aids;Therapeutic Activities;Functional tasks;Patient/family education    Pain Pain Assessment Pain Assessment: No/denies pain  Prior Functioning  Lives With: Alone Available Help at Discharge: Available PRN/intermittently (per daughter, she works out of the house during the day and may have trouble arranging care during the day time if needed) Vocation: Retired  Function:  Eating Eating   Modified Consistency Diet: Yes Eating Assist Level: Supervision or verbal cues           Cognition Comprehension Comprehension assist level: Understands basic less than 25% of the time/ requires cueing >75% of the time  Expression   Expression assist level: Expresses basic 25 - 49% of the time/requires cueing 50 - 75% of the time. Uses single words/gestures.  Social Interaction Social Interaction assist level: Interacts appropriately 75 - 89% of the time - Needs redirection for appropriate language or to initiate interaction.  Problem Solving Problem solving assist level: Solves basic 25 - 49% of the time - needs direction more than half the time to initiate, plan or complete simple activities  Memory Memory assist level: Recognizes or recalls 25 - 49% of the time/requires cueing 50 - 75% of the time   Short Term Goals: Week 1: SLP Short Term  Goal 1 (Week 1): Patient will demonstrate sustained attention to functional task for ~2 minutes with Max A multimodal cues.  SLP Short Term Goal 2 (Week 1): Patient will initiate verbal responses to 50% of questions with Max A multimodal cues.  SLP Short Term Goal 3 (Week 1): Patient will follow 1 step commands in 50% of opportunities with Max A multimodal cues.  SLP Short Term Goal 4 (Week 1): Patient will orient to place, time and situation with Max A multimodal cues.  SLP Short Term Goal 5 (Week 1): Patient will conusme current diet with minimal overt s/s of aspiration with Mod A verbal cues for use of swallowing compensatory strategies.   Refer to Care Plan for Long Term Goals  Recommendations for other services: Neuropsych  Discharge Criteria: Patient will be discharged from SLP if patient refuses treatment 3 consecutive times without medical reason, if treatment goals not met, if there is a change in medical status, if patient makes no progress towards goals or if patient is discharged from hospital.  The above assessment, treatment plan, treatment alternatives and goals were discussed and mutually agreed upon: by family  Adair Lauderback 02/22/2017, 3:18 PM

## 2017-02-22 NOTE — Progress Notes (Signed)
Subjective/Complaints: No issues overnite , discussed with CVTS, hoping to get drain out soon from Left thigh  ROS-  No CP/SOB, NVD  Objective: Vital Signs: Blood pressure 136/89, pulse 97, temperature 98.6 F (37 C), temperature source Oral, resp. rate 18, height _0  (1.6 m), weight 71.7 kg (158 lb 1.1 oz), SpO2 97 %. No results found. Results for orders placed or performed during the hospital encounter of 02/21/17 (from the past 72 hour(s))  CBC WITH DIFFERENTIAL     Status: Abnormal   Collection Time: 02/22/17  4:32 AM  Result Value Ref Range   WBC 18.9 (H) 4.0 - 10.5 K/uL   RBC 3.54 (L) 3.87 - 5.11 MIL/uL   Hemoglobin 10.2 (L) 12.0 - 15.0 g/dL   HCT 32.3 (L) 36.0 - 46.0 %   MCV 91.2 78.0 - 100.0 fL   MCH 28.8 26.0 - 34.0 pg   MCHC 31.6 30.0 - 36.0 g/dL   RDW 14.5 11.5 - 15.5 %   Platelets 365 150 - 400 K/uL   Neutrophils Relative % 77 %   Neutro Abs 14.7 (H) 1.7 - 7.7 K/uL   Lymphocytes Relative 8 %   Lymphs Abs 1.4 0.7 - 4.0 K/uL   Monocytes Relative 12 %   Monocytes Absolute 2.3 (H) 0.1 - 1.0 K/uL   Eosinophils Relative 3 %   Eosinophils Absolute 0.5 0.0 - 0.7 K/uL   Basophils Relative 0 %   Basophils Absolute 0.0 0.0 - 0.1 K/uL  Comprehensive metabolic panel     Status: Abnormal   Collection Time: 02/22/17  4:32 AM  Result Value Ref Range   Sodium 129 (L) 135 - 145 mmol/L   Potassium 4.4 3.5 - 5.1 mmol/L   Chloride 91 (L) 101 - 111 mmol/L   CO2 30 22 - 32 mmol/L   Glucose, Bld 103 (H) 65 - 99 mg/dL   BUN 16 6 - 20 mg/dL   Creatinine, Ser 0.64 0.44 - 1.00 mg/dL   Calcium 8.5 (L) 8.9 - 10.3 mg/dL   Total Protein 5.7 (L) 6.5 - 8.1 g/dL   Albumin 2.4 (L) 3.5 - 5.0 g/dL   AST 24 15 - 41 U/L   ALT 20 14 - 54 U/L   Alkaline Phosphatase 94 38 - 126 U/L   Total Bilirubin 1.2 0.3 - 1.2 mg/dL   GFR calc non Af Amer >60 >60 mL/min   GFR calc Af Amer >60 >60 mL/min    Comment: (NOTE) The eGFR has been calculated using the CKD EPI equation. This calculation has  not been validated in all clinical situations. eGFR's persistently <60 mL/min signify possible Chronic Kidney Disease.    Anion gap 8 5 - 15  Urinalysis, Routine w reflex microscopic     Status: None   Collection Time: 02/22/17  7:17 AM  Result Value Ref Range   Color, Urine YELLOW YELLOW   APPearance CLEAR CLEAR   Specific Gravity, Urine 1.015 1.005 - 1.030   pH 8.0 5.0 - 8.0   Glucose, UA NEGATIVE NEGATIVE mg/dL   Hgb urine dipstick NEGATIVE NEGATIVE   Bilirubin Urine NEGATIVE NEGATIVE   Ketones, ur NEGATIVE NEGATIVE mg/dL   Protein, ur NEGATIVE NEGATIVE mg/dL   Nitrite NEGATIVE NEGATIVE   Leukocytes, UA NEGATIVE NEGATIVE     HEENT: RIght facial bruising Cardio: RRR and no murmur Resp: CTA B/L and unlabored GI: BS positive and NT, ND Extremity:  Edema trace pretib Skin:   Other Sternotomy incision with bruising but  healing well, R subclavicular ecchymosis no drainage, L medial thigh with drain and hand grenade Neuro: Alert/Oriented, Flat and Abnormal Motor 4- in BUE and BLE Musc/Skel:  Other no severe pain with LE movement Gen - frail appearing but NAD   Assessment/Plan: 1. Functional deficits secondary to debility s/p AVR/ascending aortic, pelvic fracture which require 3+ hours per day of interdisciplinary therapy in a comprehensive inpatient rehab setting. Physiatrist is providing close team supervision and 24 hour management of active medical problems listed below. Physiatrist and rehab team continue to assess barriers to discharge/monitor patient progress toward functional and medical goals. FIM:                   Function - Comprehension Comprehension: Auditory Comprehension assist level: Understands basic 90% of the time/cues < 10% of the time, Understands basic 75 - 89% of the time/ requires cueing 10 - 24% of the time  Function - Expression Expression: Verbal Expression assist level: Expresses basic 50 - 74% of the time/requires cueing 25 - 49% of the  time. Needs to repeat parts of sentences.  Function - Social Interaction Social Interaction assist level: Interacts appropriately with others with medication or extra time (anti-anxiety, antidepressant).  Function - Problem Solving Problem solving assist level: Solves basic 25 - 49% of the time - needs direction more than half the time to initiate, plan or complete simple activities  Function - Memory Memory assist level: Recognizes or recalls 50 - 74% of the time/requires cueing 25 - 49% of the time  Medical Problem List and Plan: 1.  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             -CIR PT, OT,CIR evals 2.  DVT Prophylaxis/Anticoagulation: Pharmaceutical: Other (comment)--to resume eliquis today.  3. Chronic pain/Pain Management: Continue cymbalta bid. Will discontinue tramadol due as it likely contributing to confusion.  4. Mood: LCSW to follow for evaluation and support.  5. Neuropsych: This patient is not fully capable of making decisions on her own behalf.             -limit neurosedating medication as possible             -recheck UA and UCX             -pt with recent head trauma (CT negative for acute injury but with substantial atrophy) 6. Skin/Wound Care: Monitor wound daily for healing. Routine pressure relief measures.  7. Fluids/Electrolytes/Nutrition: Monitor I/O. Check lytes in am. Add protein supplement.  8. A fib: Monitor HR bid. Continue ASA, coreg and lanoxin for heart rate control.  10. HTN: Monitor BP bid--poorly controlled at this time. May need to add 12. Congestive heart failure: Monitor weight daily--on upward trend. Continue IV lasix. Add fluid restriction given hyponatremia and  13. Hypoxia: Encourage IS. Wean oxygen as tolerated.  14. Right pelvic fracture: WBAT 15. Achalasia/Dysphagia: Full supervision with modified diet for safety. 16. ABLA: Monitor serially.Hgb 10.2   Continue iron supplement. Recent epistaxis:  17. Persistent leucocytosis: Monitor for signs of infections.  10. Right hydronephrosis: ? Chronic. Renal status stable.  11.  Hypokalemia-KCL supplement repeat on Friday !2.  Hyponatremia- fluid restrict 13.  Hypoalbuminemia prostat LOS (Days) 1 A FACE TO FACE EVALUATION WAS PERFORMED  Lydia Jordan E 02/22/2017, 7:37 AM

## 2017-02-22 NOTE — Progress Notes (Signed)
Patient information reviewed and entered into eRehab system by Kaimana Lurz, RN, CRRN, PPS Coordinator.  Information including medical coding and functional independence measure will be reviewed and updated through discharge.     Per nursing patient was given "Data Collection Information Summary for Patients in Inpatient Rehabilitation Facilities with attached "Privacy Act Statement-Health Care Records" upon admission.  

## 2017-02-22 NOTE — Evaluation (Signed)
Physical Therapy Assessment and Plan  Patient Details  Name: Lydia Jordan MRN: [id number] Date of Birth: [birth year]  PT Diagnosis: Coordination disorder, Difficulty walking, Impaired cognition and Muscle weakness Rehab Potential: Fair ELOS: 17-21 days   Today's Date: 02/22/2017 PT Individual Time: 1400-1515 PT Individual Time Calculation (min): 75 min    Problem List:  Patient Active Problem List   Diagnosis Date Noted  . Debility 02/21/2017  . Acute on chronic diastolic heart failure ([place])   . Chronic diastolic congestive heart failure ([place])   . S/P AVR   . Dysphagia   . Tachypnea   . Hypokalemia   . Leukocytosis   . S/P ascending aortic dissection repair 02/15/2017  . 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 S/P CABG x 1 02/15/2017  . Pubic ramus fracture, right, closed, initial encounter ([place]) 02/12/2017  . Chronic pain   . Cervical disc disease   . Degenerative arthritis   . Physical deconditioning   . Fall   . Closed fracture of multiple pubic rami ([place])   . Anemia   . Hydronephrosis of right kidney   . Ascending aortic dissection ([place]) 02/11/2017  . Pericardial effusion 02/11/2017  . S/P cardiac cath: (2018) a. mild to moderate disease but no flow limiting lesions with perserved EF. b. large-caliber almost ectatic vessels for patients age. 01/12/2017  . Hypertension 01/12/2017  . CAD (coronary artery disease): mild to moderate non-obstructive 01/12/2017  . Epistaxis: (2018) cauterized by Dr. Erik Jordan 41/32/4401  . Longstanding persistent atrial fibrillation ([place])   . Ascending aortic aneurysm ([place]) 01/11/2017  . NSTEMI (non-ST elevated myocardial infarction) ([place]) 01/10/2017    Past Medical History:  Past Medical History:  Diagnosis Date  . Acid reflux   . Arthritis   . Ascending aortic aneurysm ([place]) 01/11/2017  . Ascending aortic dissection ([place]) 02/11/2017  . CAD (coronary artery disease)    Cath 01/11/17 showed 50%  ost RCA and 50% mLAD --> medical therapy   . Cervical disc disease   . Chronic diastolic congestive heart failure ([place])   . Chronic pain    Secondary to degenerative disc disease and arthritis  . Degenerative arthritis   . Hydronephrosis of right kidney   . Hypertension   . Hypothyroidism   . Hypothyroidism   . Longstanding persistent atrial fibrillation ([place])    originally diagnosed 2007  . NSTEMI (non-ST elevated myocardial infarction) ([place]) 01/10/2017  . Pubic ramus fracture, right, closed, initial encounter ([place]) 02/12/2017  . S/P ascending aortic dissection repair 02/15/2017   Straight graft replacement of sub-acute ascending aortic dissection with hemi-arch distal aortic reconstruction  . 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 AORTIC VALVE REPLACEMENT N/A 02/15/2017   Procedure: AORTIC VALVE REPLACEMENT (AVR);  Surgeon: Lydia Alberts, MD;  Location: [facility];  Service: Open Heart Surgery;  Laterality: N/A;  . BENTALL PROCEDURE N/A 02/15/2017   Procedure: BENTALL AORTIC ROOT REPLACEMENT PROCEDURE, REPAIR ASCENDING AORTIC DISSECTION;  Surgeon: Lydia Alberts, MD;  Location: [facility];  Service: Open Heart Surgery;  Laterality: N/A;  . CLIPPING OF ATRIAL APPENDAGE Left 02/15/2017   Procedure: CLIPPING OF ATRIAL APPENDAGE;  Surgeon: Lydia Alberts, MD;  Location:  [facility];  Service: Open Heart Surgery;  Laterality: Left;  . CORONARY ARTERY BYPASS GRAFT N/A 02/15/2017   Procedure: CORONARY ARTERY BYPASS GRAFTING (CABG) x  one, using left saphenous vein;  Surgeon: Lydia Alberts, MD;  Location: [facility];  Service: Vascular;  Laterality: N/A;  . ESOPHAGEAL MANOMETRY N/A 07/31/2015    Procedure: ESOPHAGEAL MANOMETRY (EM);  Surgeon: Lydia Silence, MD;  Location: [facility];  Service: Endoscopy;  Laterality: N/A;  . ESOPHAGOGASTRODUODENOSCOPY (EGD) WITH PROPOFOL N/A 09/02/2015   Procedure: ESOPHAGOGASTRODUODENOSCOPY (EGD) WITH PROPOFOL;  Surgeon: Lydia Silence, MD;  Location: [facility];  Service: Endoscopy;  Laterality: N/A;  botulinum toxin injection (100 Units; 25 units in 4 quadrants 1-2 cm proximal to GE junction  . LEFT HEART CATH AND CORONARY ANGIOGRAPHY N/A 01/11/2017   Procedure: Left Heart Cath and Coronary Angiography;  Surgeon: Lydia Man, MD;  Location: [facility];  Service: Cardiovascular;  Laterality: N/A;  . PERICARDIAL FLUID DRAINAGE  02/15/2017   Procedure: DRAINAGE OF PERICARDIAL FLUID;  Surgeon: Lydia Alberts, MD;  Location: [facility];  Service: Vascular;;  . skin cancer area removed      right leg healing well  . TEE WITHOUT CARDIOVERSION N/A 02/15/2017   Procedure: TRANSESOPHAGEAL ECHOCARDIOGRAM (TEE);  Surgeon: Lydia Alberts, MD;  Location: [facility];  Service: Open Heart Surgery;  Laterality: N/A;  . VAGINAL HYSTERECTOMY      Assessment & Plan Clinical Impression: Lydia Jordan is a 83 y.o. female with history of A fib, recent NSTEMI, achalasia, DJD/DDD with chronic pain who sustained a fall 02/08/17 with pain in right hip and decline in mobility.  Evaluation in ED 5/12 revealed right pubic rami fracture, chronic right UPJ obstruction with moderate to severe hydronephrosis as well as type A aortic dissection. She was evaluated by Dr. Roxy Jordan and was cleared for repair of aneurysm on 02/15/17.She was taken to the operating room on 5/16.  She underwent CABG x 1 with SVG to RCA with open harvest of saphenous vein from left thigh, Clipping of LA Appendage, Drainage of Pericardial Effusion, Biologic Bentall procedure, and Repair of Ascending thoracic aortic dissection. Pubic rami fracture to be WBAT per Dr. Fredonia Jordan. Post op on milironone for fluid  overload and on low dose Lovenox for A fib. She has had reports of dysphagia and MBS done functional swallow with high penetration of thin liquids occasionally and modified diet recommended by ST. CXR reviewed 5/21, stable changes. Therapy ongoing and patient limited by SOB and fatigue. Patient with significant deficits in mobility and ability to carry out ADL tasks. CIR recommended for follow up therapy. Independent PTA--has started using AD recently due to multiple falls. Sister and daughter supportive and can assist as needed after discharge.  Patient transferred to CIR on 02/21/2017 .   Patient currently requires mod to max with mobility secondary to muscle weakness, decreased cardiorespiratoy endurance, decreased initiation, decreased attention, decreased awareness, decreased problem solving, decreased safety awareness, decreased memory and delayed processing and decreased sitting balance, decreased standing balance, decreased postural control and decreased balance strategies.  Prior to hospitalization, patient was modified independent  with mobility and lived with Alone in a   home.  Home access is 1Stairs to enter.  Patient will benefit from skilled PT intervention to maximize safe functional mobility, minimize fall risk and decrease caregiver burden for planned discharge home with 24 hour supervision.  Anticipate patient will benefit from follow up [place] at discharge.  PT - End of Session  Activity Tolerance: Decreased this session Endurance Deficit: Yes PT Assessment Rehab Potential (ACUTE/IP ONLY): Fair Barriers to Discharge: Decreased caregiver support PT Patient demonstrates impairments in the following area(s): Balance;Edema;Endurance;Motor;Pain;Perception;Safety PT Transfers Functional Problem(s): Bed Mobility;Bed to Chair;Car PT Locomotion Functional Problem(s): Stairs;Ambulation PT Plan PT Intensity: Minimum of 1-2 x/day ,45 to 90 minutes PT Frequency: 5 out of 7 days PT Duration  Estimated Length of Stay: 17-21 days PT Treatment/Interventions: Ambulation/gait training;Community reintegration;DME/adaptive equipment instruction;Neuromuscular re-education;Psychosocial support;Stair training;UE/LE Coordination activities;UE/LE Strength taining/ROM;Wheelchair propulsion/positioning;Therapeutic Activities;Discharge planning;Balance/vestibular training;Cognitive remediation/compensation;Disease management/prevention;Functional mobility training;Patient/family education;Splinting/orthotics;Therapeutic Exercise PT Transfers Anticipated Outcome(s): supervision PT Locomotion Anticipated Outcome(s): supervision household with LRAD PT Recommendation Recommendations for Other Services: Neuropsych consult Follow Up Recommendations: Home health PT;24 hour supervision/assistance Patient destination: Home Equipment Recommended: To be determined  Skilled Therapeutic Intervention No c/o pain.  Session focus on initial PT evaluation and pt/family education regarding CLOF, POC, and goals for d/c.  Discussed potential need for 24/7 supervision and pt's daughter reports that she works away from home during the day but would be able to stay nights with her mother.  Pt disoriented and lethargic throughout session with language of confusion and confabulation.  Per daughter, this cognitive impairment has only begun in the last several days.  PT instructed pt in basic bed mobility and transfers with max multimodal cues for not using UEs to push/pull due to sternal precautions.  Pt requiring mod assist for rolling to R and max assist for sit<>stand transfers.  Pt returned to room at end of session and positioned in w/c with QRB in place, call bell in reach and needs met.   PT Evaluation Pain Pain Assessment Pain Assessment: No/denies pain Home Living/Prior Functioning Home Living Available Help at Discharge: Available PRN/intermittently (per daughter, she works out of the house during the day and may  have trouble arranging care during the day time if needed) Home Access: Stairs to enter [employer] of Steps: 1 Entrance Stairs-Rails: None Home Layout: One level (1-step down into sunroom) Additional Comments: pt's and daughter's homes are joined by a long sunrrom  Lives With: Alone Prior Function Level of Independence: Independent with transfers;Independent with gait (had briefly used a RW after previous admission but had progressed to no AD)  Able to Take Stairs?: Yes Driving: No Vocation: Retired Art gallery manager: Impaired Comments: ? L inattention, max cues for attention for body part ID Praxis Praxis: Impaired Praxis Impairment Details: Initiation  Cognition Overall Cognitive Status: Impaired/Different from baseline Arousal/Alertness: Lethargic Orientation Level: Oriented to person;Oriented to time;Disoriented to situation;Oriented to place (able to select year and location from a choice of 2) Attention: Focused;Sustained Focused Attention: Appears intact Sustained Attention: Impaired Sustained Attention Impairment: Verbal basic;Functional basic Memory: Impaired Awareness: Impaired Awareness Impairment: Intellectual impairment Problem Solving: Impaired Problem Solving Impairment: Functional basic Safety/Judgment: Impaired Comments: Patient very lethargic throughout evaluation and was falling asleep mid sentence. RN aware and reported she fel it was due to amount of pain medication received this morning.  Sensation Coordination Gross Motor Movements are Fluid and Coordinated: Yes Fine Motor Movements are Fluid and Coordinated: No Motor  Motor Motor: Abnormal postural alignment and control;Motor apraxia  Mobility Bed Mobility Bed Mobility: Rolling Right;Right Sidelying to Sit Rolling Right: 3: Mod assist Right Sidelying to Sit: 3: Mod assist Transfers Transfers: Yes Sit to Stand: 2: Max assist Stand to Sit: 2: Max assist Stand  Pivot Transfers: 2: Max assist Locomotion  Ambulation Ambulation: Yes Ambulation/Gait Assistance: 2: Max assist Ambulation Distance (Feet): 15 Feet  Assistive device: 1 person hand held assist Stairs / Additional Locomotion Stairs: No Wheelchair Mobility Wheelchair Mobility: No  Trunk/Postural Assessment  Cervical Assessment Cervical Assessment: Within Functional Limits Thoracic Assessment Thoracic Assessment: Exceptions to [facility] (rounded shoulders) Lumbar Assessment Lumbar Assessment: Within Functional Limits Postural Control Postural Control: Within Functional Limits  Balance Balance Balance Assessed: Yes Static Sitting Balance Static Sitting - Balance Support: Left upper extremity supported;Right upper extremity supported;Feet supported Static Sitting - Level of Assistance: 5: Stand by assistance   See Function Navigator for Current Functional Status.   Refer to Care Plan for Long Term Goals  Recommendations for other services: Neuropsych  Discharge Criteria: Patient will be discharged from PT if patient refuses treatment 3 consecutive times without medical reason, if treatment goals not met, if there is a change in medical status, if patient makes no progress towards goals or if patient is discharged from hospital.  The above assessment, treatment plan, treatment alternatives and goals were discussed and mutually agreed upon: by patient  Earnest Conroy Penven-Crew 02/22/2017, 3:46 PM

## 2017-02-22 NOTE — Progress Notes (Signed)
   02/22/17 1520  Clinical Encounter Type  Visited With Patient and family together  Visit Type Follow-up  Spiritual Encounters  Spiritual Needs Emotional  Stress Factors  Patient Stress Factors Health changes  Family Stress Factors None identified  Introduction to Pt and daughter. Pt fatigued and not up to completing paperwork today. Mentioned a chaplain could be paged tomorrow if Pt ready.

## 2017-02-22 NOTE — Consult Note (Signed)
Cardiology Consult    Patient ID: Lydia Jordan MRN: [id number], DOB/AGE: 1933-10-09   Admit date: 02/21/2017 Date of Consult: 02/22/2017  Primary Physician: Lavone Orn, MD Primary Cardiologist: Dr. Acie Fredrickson Requesting Provider: Dr. Letta Pate  Reason for Consult: CHF  Patient Profile    Lydia Jordan is a 83 yo female with a PMH significant for CAD and AS s/p CABG x 1 (SVG to RCA), aortic root and valve replacement, LAA (02/15/17), persistent Afib on eliquis, HTN, HLD, hypothyroidism, chronic diastolic heart failure, and GERD. She was transferred to inpatient rehab medicine and was noted to be hypoxic with lower extremity edema.  Lydia Jordan is a 83 y.o. female who is being seen today for the evaluation of CHF at the request of Dr. Letta Pate   Past Medical History   Past Medical History:  Diagnosis Date  . Acid reflux   . Arthritis   . Ascending aortic aneurysm ([place]) 01/11/2017  . Ascending aortic dissection ([place]) 02/11/2017  . CAD (coronary artery disease)    Cath 01/11/17 showed 50% ost RCA and 50% mLAD --> medical therapy   . Cervical disc disease   . Chronic diastolic congestive heart failure ([place])   . Chronic pain    Secondary to degenerative disc disease and arthritis  . Degenerative arthritis   . Hydronephrosis of right kidney   . Hypertension   . Hypothyroidism   . Hypothyroidism   . Longstanding persistent atrial fibrillation ([place])    originally diagnosed 2007  . NSTEMI (non-ST elevated myocardial infarction) ([place]) 01/10/2017  . Pubic ramus fracture, right, closed, initial encounter ([place]) 02/12/2017  . S/P ascending aortic dissection repair 02/15/2017   Straight graft replacement of sub-acute ascending aortic dissection with hemi-arch distal aortic reconstruction  . 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 AORTIC VALVE REPLACEMENT N/A 02/15/2017   Procedure: AORTIC VALVE REPLACEMENT (AVR);  Surgeon: [name], MD;  Location: [place];  Service: Open Heart Surgery;  Laterality: N/A;  . BENTALL PROCEDURE N/A 02/15/2017   Procedure: BENTALL AORTIC ROOT REPLACEMENT PROCEDURE, REPAIR ASCENDING AORTIC DISSECTION;  Surgeon: [name], MD;  Location: [place];  Service: Open Heart Surgery;  Laterality: N/A;  . CLIPPING OF ATRIAL APPENDAGE Left 02/15/2017   Procedure: CLIPPING OF ATRIAL APPENDAGE;  Surgeon: [name], MD;  Location: [facility];  Service: Open Heart Surgery;  Laterality: Left;  . CORONARY ARTERY BYPASS GRAFT N/A 02/15/2017   Procedure: CORONARY ARTERY BYPASS GRAFTING (CABG) x  one, using left saphenous vein;  Surgeon: [name], MD;  Location: [facility] OR;  Service: Vascular;  Laterality: N/A;  . ESOPHAGEAL MANOMETRY N/A 07/31/2015   Procedure: ESOPHAGEAL MANOMETRY (EM);  Surgeon: Arta Silence, MD;  Location: [facility];  Service: Endoscopy;  Laterality: N/A;  . ESOPHAGOGASTRODUODENOSCOPY (EGD) WITH PROPOFOL N/A 09/02/2015   Procedure: ESOPHAGOGASTRODUODENOSCOPY (EGD) WITH PROPOFOL;  Surgeon: Arta Silence, MD;  Location: [facility];  Service: Endoscopy;  Laterality: N/A;  botulinum toxin injection (100 Units; 25 units in 4 quadrants 1-2 cm proximal to GE junction  . LEFT HEART CATH AND CORONARY ANGIOGRAPHY N/A 01/11/2017   Procedure: Left Heart Cath and Coronary Angiography;  Surgeon: Leonie Man, MD;  Location: [place] [facility];  Service: Cardiovascular;  Laterality: N/A;  . PERICARDIAL FLUID DRAINAGE  02/15/2017   Procedure: DRAINAGE OF PERICARDIAL FLUID;  Surgeon: [name], MD;  Location: [place];  Service: Vascular;;  . skin cancer area removed      right leg healing well  . TEE WITHOUT CARDIOVERSION N/A 02/15/2017    Procedure: TRANSESOPHAGEAL ECHOCARDIOGRAM (TEE);  Surgeon: [name], MD;  Location: [place];  Service: Open Heart Surgery;  Laterality: N/A;  . VAGINAL HYSTERECTOMY       Allergies  Allergies  Allergen Reactions  . Demerol [Meperidine] Anaphylaxis    Reaction: "Heart stopped"    History of Present Illness    Lydia Jordan recently had NSTEMI and underwent CABG x 1 (SVG to RCA), AVR with ascending aortic aneurysm repair (02/15/17). She continued in rate-controlled Afib and restarted her home Eliquis. TEE with severe LVH. She was on milrinone drip post-operatively for fluid overload. She was transferred to rehab medicine (02/21/17) for intensive therapy.   On my interview, the daughter is at bedside and the patient is lethargic and confused. Per the daughter, this confusion started yesterday afternoon. She could not answer orientation questions. She has greater than 2+ edema in B LE extremities and edema in her upper extremities. She was transferred on 20 mg lasix BID. Her home dose of lasix appears to be 40 mg BID. She was continued on coreg and digoxin. She remains in rate-controlled Afib on Eliquis.   Inpatient Medications    . apixaban  2.5 mg Oral BID  . atorvastatin  20 mg Oral Daily  . bisacodyl  10 mg Oral Daily   Or  . bisacodyl  10 mg Rectal Daily  . carvedilol  3.125 mg Oral BID WC  . Chlorhexidine Gluconate Cloth  6 each Topical Q0600  . digoxin  0.125 mg Oral q morning - 10a  . docusate sodium  200 mg Oral Daily  . DULoxetine  30 mg Oral QHS  . DULoxetine  60 mg Oral Daily  . feeding supplement (ENSURE ENLIVE)  237 mL Oral TID WC  . folic acid-pyridoxine-cyancobalamin  1 tablet Oral Daily  . furosemide  60 mg Intravenous BID  . iron polysaccharides  150 mg Oral q12n4p  . levothyroxine  100 mcg Oral QAC breakfast  . pantoprazole  80 mg Oral Daily  . potassium chloride  40 mEq Oral BID  . sodium chloride flush  10-40 mL Intracatheter Q12H     Outpatient  Medications    Prior to Admission medications   Medication Sig Start Date End Date Taking? Authorizing Provider  apixaban (ELIQUIS) 2.5 MG TABS tablet Take 1 tablet (2.5 mg total) by mouth 2 (two) times daily. 02/21/17   Barrett, Lodema Hong, PA-C  aspirin EC 81 MG EC tablet Take 1 tablet (81 mg total) by mouth daily. 02/22/17   Barrett, Erin R, PA-C  atorvastatin (LIPITOR) 20 MG tablet Take 1 tablet (20 mg total) by mouth daily. 01/12/17   Delos Haring, PA-C  carvedilol (COREG) 3.125 MG tablet Take 1 tablet (3.125 mg total) by mouth 2 (two) times daily with a meal. 02/21/17   Barrett, Erin R, PA-C  digoxin (LANOXIN) 0.125 MG tablet Take 0.125 mg by mouth every morning.    [provider]  DULoxetine (CYMBALTA) 30 MG capsule Take 30 mg by mouth at bedtime.     [provider]  DULoxetine (CYMBALTA) 60 MG capsule Take 60 mg by mouth daily. 01/02/17   [provider]  furosemide (LASIX) 40 MG tablet Take 1 tablet (40 mg total) by mouth 2 (two) times daily. 02/21/17 02/21/18  Barrett, Lodema Hong, PA-C  omeprazole (PRILOSEC) 40 MG capsule Take 40 mg by mouth at bedtime.    [provider]  potassium chloride (KLOR-CON) 20 MEQ packet Take 40 mEq by mouth 2 (two) times daily. 02/21/17   Barrett, Erin R, PA-C  SYNTHROID 100 MCG tablet Take 100 mcg by mouth daily before breakfast. 06/26/15   [provider]  traMADol (ULTRAM) 50 MG tablet Take 1-2 tablets (50-100 mg total) by mouth every 4 (four) hours as needed for moderate pain. 02/21/17   Barrett, Lodema Hong, PA-C     Family History     Family History  Problem Relation Age of Onset  . Hypertension Mother   . Anuerysm Mother        died in her 71's  . Arthritis Father        died at age 25  . Heart attack Brother        died in 26's    Social History    Social History   Social History  . Marital status: Divorced    Spouse name: N/A  . Number of children: 1  . Years of education: N/A   Occupational History    . retired    Social History Main Topics  . Smoking status: Former Research scientist (life sciences)  . Smokeless tobacco: Never Used     Comment: Former occasional smoker x 3 yrs.  . Alcohol use No  . Drug use: No  . Sexual activity: Not on file   Other Topics Concern  . Not on file   Social History Narrative   Lives alone but in house connected to her daughter's house   Limited mobility due to severe degenerative arthritis     Review of Systems    Level 5 Caveat: ROS difficult to obtain given patient's confusion.  Physical Exam    Blood pressure 136/89, pulse 97, temperature 98.6 F (37 C), temperature source Oral, resp. rate 18, height 5\' 3"  (1.6 m), weight 149 lb 3.2 oz (67.7 kg), SpO2 98 %.  General: Pleasant, NAD Psych: Normal affect. Neuro:  Moves all extremities spontaneously. She is not oriented and is lethargic HEENT: Normal  Neck: Supple without bruits or JVD. Lungs:  Resp regular and unlabored, diminished in bases with occasional expiratory wheeze Heart: Irregular rhythm, regular rate Abdomen: Soft, non-tender, non-distended, BS + x 4.  Extremities: No clubbing, cyanosis, 2+ edema B LE, 1+ in upper extremities, pulses faint given her swelling; feet with edematous skin changes  Labs    Troponin (Point of Care Test) No results for input(s): TROPIPOC in the last 72 hours. No results for input(s): CKTOTAL, CKMB, TROPONINI in the last 72 hours. Lab Results  Component Value Date   WBC 18.9 (H) 02/22/2017   HGB 10.2 (L) 02/22/2017   HCT 32.3 (L) 02/22/2017   MCV 91.2 02/22/2017   PLT 365 02/22/2017     Recent Labs Lab 02/22/17 0432  NA 129*  K 4.4  CL 91*  CO2 30  BUN 16  CREATININE 0.64  CALCIUM 8.5*  PROT 5.7*  BILITOT 1.2  ALKPHOS 94  ALT 20  AST 24  GLUCOSE 103*   Lab Results  Component Value Date   CHOL 168 01/11/2017   HDL 41 01/11/2017   LDLCALC 105 (H) 01/11/2017   TRIG 111 01/11/2017   Lab Results  Component Value Date  DDIMER 2.22 (H) 02/15/2017      Radiology Studies    Dg Chest 2 View  Result Date: 02/22/2017 CLINICAL DATA:  Leukocytosis EXAM: CHEST  2 VIEW COMPARISON:  02/20/2017 FINDINGS: Prior valve replacement. Cardiomegaly. Small bilateral pleural effusions. Areas of perihilar and lower lobe atelectasis. No overt edema. IMPRESSION: Cardiomegaly with vascular congestion. Perihilar and lower lobe atelectasis with small effusions. Electronically Signed   By: Rolm Baptise M.D.   On: 02/22/2017 10:26   Ct Head Wo Contrast  Result Date: 02/11/2017 CLINICAL DATA:  Patient fell Wednesday and hit the right side of her head and face on curb. Swelling and bruising on right side around orbit and frontal region. Patient has previous history of loss of disk height in her cervical spine EXAM: CT HEAD WITHOUT CONTRAST CT MAXILLOFACIAL WITHOUT CONTRAST CT CERVICAL SPINE WITHOUT CONTRAST TECHNIQUE: Multidetector CT imaging of the head, cervical spine, and maxillofacial structures were performed using the standard protocol without intravenous contrast. Multiplanar CT image reconstructions of the cervical spine and maxillofacial structures were also generated. COMPARISON:  Head CT dated 02/10/2015. MRI of the cervical spine dated 07/20/2016. FINDINGS: CT HEAD FINDINGS Brain: There is mild generalized parenchymal atrophy with commensurate dilatation of the ventricles and sulci. Mild chronic small vessel ischemic changes noted within the periventricular and subcortical white matter. There is no mass, hemorrhage, edema or other evidence of acute parenchymal abnormality. No extra-axial hemorrhage. Vascular: There are chronic calcified atherosclerotic changes of the large vessels at the skull base. No unexpected hyperdense vessel. Skull: Chronic-appearing deformity of the right zygoma and right lateral orbital wall. No acute appearing fracture or displacement. Other: Soft tissue hematoma overlying the right frontal bone, measuring approximately 1.7 x 1 cm. No underlying  fracture. CT MAXILLOFACIAL FINDINGS Osseous: Lower frontal bones are intact and normally aligned. Old mild deformity of the right lateral orbital wall, stable compared to the earlier head CT from 2016. Osseous structures about the orbits are otherwise intact and normally aligned bilaterally. No displaced nasal bone fracture. Old healed fracture of the right zygoma. Left zygoma is intact and normally aligned. Bilateral pterygoid plates appear intact and normally aligned. Walls of the maxillary sinuses are intact and normally aligned bilaterally. Temporal bones appear intact and normally aligned. Orbits: Negative. No traumatic or inflammatory finding. Sinuses: Clear. Soft tissues: Unremarkable. CT CERVICAL SPINE FINDINGS Alignment: There is reversal of the normal cervical spine lordosis, likely related to the underlying degenerative changes, stable compared to the earlier MRI given patient positioning. No evidence of acute vertebral body subluxation. Skull base and vertebrae: No fracture line or displaced fracture fragment identified. Soft tissues and spinal canal: No prevertebral fluid or swelling. No visible canal hematoma. Disc levels: Degenerative changes throughout the cervical spine, with associated disc space narrowings and osseous spurring. Most significant degenerative change at the C5-6 and C6-7 levels with complete loss of disc space height and associated osseous spurring, likely source for the overlying reversal of normal cervical spine lordosis. There is, however, no more than mild central canal stenosis at any level. Degenerative hypertrophy of the uncovertebral and facet joints causing severe bilateral neural foramen stenoses at C3-4, severe bilateral neural foramen stenoses at C4-5, severe right neural foramen stenosis at C5-6 and moderate to severe bilateral neural foramen stenoses at C6-7. Suspect associated nerve root impingement at 1 or more levels. Upper chest: Mass versus consolidation at the  left lung apex, incompletely imaged at the lower aspects of the chest CT. Other: Carotid atherosclerosis. IMPRESSION: 1. No acute  intracranial abnormality. No intracranial mass, hemorrhage or edema. Chronic small vessel ischemic changes in the white matter. 2. Focal soft tissue hematoma overlying the right frontal bone. No underlying fracture. 3. No acute facial bone fracture or displacement. Old healed fractures of the right zygoma and right lateral orbital wall. 4. No fracture or acute subluxation within the cervical spine. Degenerative changes of the cervical spine, at least moderate in degree, as detailed above. 5. Masslike density at the left lung apex, incompletely imaged at the lower aspects of this chest CT. This likely represents the upper aspect of the ectatic thoracic aortic arch, but would consider chest CT to confirm benignity. These results were called by telephone at the time of interpretation on 02/11/2017 at 6:15 pm to Dr. Maryan Rued, who verbally acknowledged these results. Electronically Signed   By: Franki Cabot M.D.   On: 02/11/2017 18:18   Ct Chest W Contrast  Result Date: 02/11/2017 CLINICAL DATA:  Question of mass or prominent aortic arch at the left lung apex on CT of the cervical spine. Further evaluation requested. EXAM: CT CHEST WITH CONTRAST TECHNIQUE: Multidetector CT imaging of the chest was performed during intravenous contrast administration. 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 The dissection extends along the ascending thoracic aorta, aortic arch and proximal descending thoracic aorta, and there appears to be partial thrombosis of the false lumen. An apparent  spontaneous fenestration is noted at the level of the proximal aortic arch. The great vessels appear to arise from the true lumen of the thoracic aorta. The dissection appears to resolve at the proximal descending thoracic aorta. This may reflect lower pressure than usual due to the combination of spontaneous fenestration and extravasation into the patient's apparent moderate hemopericardium. Mediastinum/Nodes: A moderate pericardial effusion is noted, with areas of mildly increased attenuation, suspicious for hemopericardium. There also appears to be a trace left-sided pleural effusion, with mildly increased attenuation, concerning for trace left-sided hemothorax. This likely reflects extension of hemorrhage across the aortic adventitia into the pericardial space and left pleural space. No definite acute contrast extravasation is identified. Lungs/Pleura: Minimal bilateral atelectasis is noted. No pneumothorax is seen. As described above, there is suspicion of trace left-sided hemothorax. No masses are seen. Upper Abdomen: The visualized abdominal aorta remains normal in caliber. There is no evidence of aortic dissection along the abdominal aorta at this time. Scattered calcification is seen along the proximal abdominal aorta. The visualized portions of the liver and spleen are grossly unremarkable. The visualized portions of the gallbladder, pancreas, adrenal glands and kidneys are within normal limits. A tiny hiatal hernia is noted. Musculoskeletal: No acute osseous abnormalities are identified. Degenerative joint space narrowing is noted at the right glenohumeral joint, with apparent scattered loose bodies and underlying diffuse joint space calcification. The visualized musculature is unremarkable in appearance. IMPRESSION: 1. 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 The dissection extends along the  ascending thoracic aorta, aortic arch and proximal descending thoracic aorta, with partial thrombosis of the false lumen. Apparent spontaneous fenestration noted at the level of the proximal aortic arch. 2. Moderate pericardial effusion demonstrates areas of mildly increased  attenuation, suspicious for hemopericardium. Trace left-sided pleural effusion also demonstrates mildly increased attenuation, concerning for trace left-sided hemothorax. This likely reflects extension of hemorrhage across the aortic adventitia to the pericardial space and left pleural space. No definite acute contrast extravasation seen at this time. 3. The dissection appears to resolve at the proximal descending thoracic aorta, and the distal descending thoracic aorta and abdominal aorta are unremarkable in appearance, aside from scattered aortic atherosclerosis. This may reflect lower pressure than usual due to the combination of spontaneous fenestration and extravasation into the patient's apparent moderate hemopericardium. The great vessels arise from the true lumen. 4. Minimal bilateral atelectasis noted. 5. Degenerative change of the right glenohumeral joint, with scattered loose bodies and underlying diffuse joint space calcification. Critical Value/emergent results were called by telephone at the time of interpretation on 02/11/2017 at 10:44 pm to Dr. Blanchie Dessert, who verbally acknowledged these results. Electronically Signed   By: Garald Balding M.D.   On: 02/11/2017 22:59   Ct Cervical Spine Wo Contrast  Result Date: 02/11/2017 CLINICAL DATA:  Patient fell Wednesday and hit the right side of her head and face on curb. Swelling and bruising on right side around orbit and frontal region. Patient has previous history of loss of disk height in her cervical spine EXAM: CT HEAD WITHOUT CONTRAST CT MAXILLOFACIAL WITHOUT CONTRAST CT CERVICAL SPINE WITHOUT CONTRAST TECHNIQUE: Multidetector CT imaging of the head, cervical spine, and  maxillofacial structures were performed using the standard protocol without intravenous contrast. Multiplanar CT image reconstructions of the cervical spine and maxillofacial structures were also generated. COMPARISON:  Head CT dated 02/10/2015. MRI of the cervical spine dated 07/20/2016. FINDINGS: CT HEAD FINDINGS Brain: There is mild generalized parenchymal atrophy with commensurate dilatation of the ventricles and sulci. Mild chronic small vessel ischemic changes noted within the periventricular and subcortical white matter. There is no mass, hemorrhage, edema or other evidence of acute parenchymal abnormality. No extra-axial hemorrhage. Vascular: There are chronic calcified atherosclerotic changes of the large vessels at the skull base. No unexpected hyperdense vessel. Skull: Chronic-appearing deformity of the right zygoma and right lateral orbital wall. No acute appearing fracture or displacement. Other: Soft tissue hematoma overlying the right frontal bone, measuring approximately 1.7 x 1 cm. No underlying fracture. CT MAXILLOFACIAL FINDINGS Osseous: Lower frontal bones are intact and normally aligned. Old mild deformity of the right lateral orbital wall, stable compared to the earlier head CT from 2016. Osseous structures about the orbits are otherwise intact and normally aligned bilaterally. No displaced nasal bone fracture. Old healed fracture of the right zygoma. Left zygoma is intact and normally aligned. Bilateral pterygoid plates appear intact and normally aligned. Walls of the maxillary sinuses are intact and normally aligned bilaterally. Temporal bones appear intact and normally aligned. Orbits: Negative. No traumatic or inflammatory finding. Sinuses: Clear. Soft tissues: Unremarkable. CT CERVICAL SPINE FINDINGS Alignment: There is reversal of the normal cervical spine lordosis, likely related to the underlying degenerative changes, stable compared to the earlier MRI given patient positioning. No  evidence of acute vertebral body subluxation. Skull base and vertebrae: No fracture line or displaced fracture fragment identified. Soft tissues and spinal canal: No prevertebral fluid or swelling. No visible canal hematoma. Disc levels: Degenerative changes throughout the cervical spine, with associated disc space narrowings and osseous spurring. Most significant degenerative change at the C5-6 and C6-7 levels with complete loss of disc space height and associated osseous spurring, likely source for the overlying reversal of normal cervical spine lordosis. There  is, however, no more than mild central canal stenosis at any level. Degenerative hypertrophy of the uncovertebral and facet joints causing severe bilateral neural foramen stenoses at C3-4, severe bilateral neural foramen stenoses at C4-5, severe right neural foramen stenosis at C5-6 and moderate to severe bilateral neural foramen stenoses at C6-7. Suspect associated nerve root impingement at 1 or more levels. Upper chest: Mass versus consolidation at the left lung apex, incompletely imaged at the lower aspects of the chest CT. Other: Carotid atherosclerosis. IMPRESSION: 1. No acute intracranial abnormality. No intracranial mass, hemorrhage or edema. Chronic small vessel ischemic changes in the white matter. 2. Focal soft tissue hematoma overlying the right frontal bone. No underlying fracture. 3. No acute facial bone fracture or displacement. Old healed fractures of the right zygoma and right lateral orbital wall. 4. No fracture or acute subluxation within the cervical spine. Degenerative changes of the cervical spine, at least moderate in degree, as detailed above. 5. Masslike density at the left lung apex, incompletely imaged at the lower aspects of this chest CT. This likely represents the upper aspect of the ectatic thoracic aortic arch, but would consider chest CT to confirm benignity. These results were called by telephone at the time of  interpretation on 02/11/2017 at 6:15 pm to Dr. Maryan Rued, who verbally acknowledged these results. Electronically Signed   By: Franki Cabot M.D.   On: 02/11/2017 18:18   Ct Hip Right Wo Contrast  Result Date: 02/11/2017 CLINICAL DATA:  83 year old female with acute right hip pain following fall three days ago. Initial encounter. EXAM: CT OF THE RIGHT HIP WITHOUT CONTRAST TECHNIQUE: Multidetector CT imaging of the right hip was performed according to the standard protocol. Multiplanar CT image reconstructions were also generated. COMPARISON:  07/10/2006 renogram and 06/06/2006 chest CT. FINDINGS: Bones/Joint/Cartilage Nondisplaced fractures of the superior and inferior right pubic rami are noted. No other fracture, subluxation or dislocation identified. Ligaments Suboptimally assessed by CT. Soft tissues Again noted is chronic right UPJ obstruction with moderate to severe hydronephrosis. IMPRESSION: Nondisplaced fractures of the superior and inferior right pubic rami. 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 Electronically Signed   By: Margarette Canada M.D.   On: 02/11/2017 18:36   Dg Chest Port 1 View  Result Date: 02/20/2017 CLINICAL DATA:  83 year old female post aortic valve replacement. Subsequent encounter. EXAM: PORTABLE CHEST 1 VIEW COMPARISON:  02/19/2017. FINDINGS: Right PICC line tip projects at the level of the distal superior vena cava/ cavoatrial junction. Right internal jugular catheter has been removed. Post aortic valve replacement and left atrial appendage clipping. Cardiomegaly. Central pulmonary vascular congestion. Perihilar/basilar subsegmental atelectasis. There may be small pleural effusions. No pneumothorax. Calcified tortuous aorta. Bilateral prominent shoulder joint degenerative changes. IMPRESSION: Right internal jugular catheter has been removed. Right PICC catheter tip distal superior vena cava/ cavoatrial junction level. Cardiomegaly post aortic valve  replacement and left atrial appendage clipping. Central pulmonary vascular prominence. Subsegmental atelectasis perihilar region lung bases. There may be small bilateral pleural effusions. Electronically Signed   By: Genia Del M.D.   On: 02/20/2017 07:36   Dg Chest Port 1 View  Result Date: 02/19/2017 CLINICAL DATA:  Central line placement.  Initial encounter. EXAM: PORTABLE CHEST 1 VIEW COMPARISON:  Chest radiograph performed earlier today at 4:44 a.m. FINDINGS: The patient's right PICC is noted ending about the mid SVC. A right IJ line is noted ending about the proximal SVC. A small left pleural effusion is suspected. No pneumothorax is seen. There is  trace fluid tracking along the right minor fissure. The cardiomediastinal silhouette is mildly enlarged. The patient is status post median sternotomy. An aortic valve replacement is noted. No acute osseous abnormalities are identified. Mild degenerative change is noted at the glenohumeral joints bilaterally, with chronic superior subluxation of the humeral heads. IMPRESSION: 1. Right PICC noted ending about the mid SVC. 2. Right IJ line noted ending about the proximal SVC. 3. Suspect small left pleural effusion. Trace fluid tracking along the right minor fissure. Mild cardiomegaly. Electronically Signed   By: Garald Balding M.D.   On: 02/19/2017 20:23   Dg Chest Port 1 View  Result Date: 02/19/2017 CLINICAL DATA:  Status post aortic valve replacement. EXAM: PORTABLE CHEST 1 VIEW COMPARISON:  02/18/2017 FINDINGS: Cyst the previous day's study, the right and left-sided chest tubes have been removed. No convincing pneumothorax. Mild atelectasis extends laterally from the right hilum, improved from the previous day's study. Left mid lung and right lower lung atelectasis noted on the prior exam has mostly resolved. No new lung abnormalities. No pulmonary edema. There is no mediastinal widening. The mediastinal tubes and right internal jugular introducer sheath  are stable. IMPRESSION: 1. Status post removal of the chest tubes. No convincing pneumothorax. 2. No acute finding or evidence of an operative complication. No mediastinal widening or pulmonary edema. Electronically Signed   By: Lajean Manes M.D.   On: 02/19/2017 07:17   Dg Chest Port 1 View  Result Date: 02/18/2017 CLINICAL DATA:  Atelectasis. Three days postop for aortic root replacement and repair of ascending aortic dissection as well as clipping of the atrial appendage. EXAM: PORTABLE CHEST 1 VIEW COMPARISON:  One-view chest x-ray 02/17/2017 FINDINGS: The heart is enlarged. Median sternotomy for aortic valve replacement and atrial clipping is again noted. Bilateral chest tubes remain place. There is no pneumothorax. Mediastinal drain is in place. Lung volumes remain low. Bilateral atelectasis is not significantly changed. A right IJ sheath is place. IMPRESSION: 1. Stable appearance of mediastinal and bilateral pleural catheter is without pneumothorax or pneumomediastinum. 2. Similar appearance of mild bilateral atelectasis. Electronically Signed   By: San Morelle M.D.   On: 02/18/2017 07:30   Dg Chest Port 1 View  Result Date: 02/17/2017 CLINICAL DATA:  Two days postop CABG, aortic root replacement procedure and repair of ascending aortic dissection. EXAM: PORTABLE CHEST 1 VIEW COMPARISON:  02/16/2017 FINDINGS: Cardiomegaly, superior mediastinal fullness and left atrial clip again identified. Mediastinal and bilateral thoracostomy tubes are again noted without pneumothorax. There has been removal of an endotracheal tube, NG tube and Swan-Ganz catheter with right IJ central venous catheter sheath remaining. Improved lung volumes noted with decreased scattered of atelectasis within both lungs. IMPRESSION: Improved lung volumes with decreased areas of atelectasis within both lungs. No other significant change. No evidence of pneumothorax. Unchanged cardiomediastinal silhouette. Electronically  Signed   By: Margarette Canada M.D.   On: 02/17/2017 07:57   Dg Chest Port 1 View  Result Date: 02/16/2017 CLINICAL DATA:  Intubation. EXAM: PORTABLE CHEST 1 VIEW COMPARISON:  02/15/2017 . FINDINGS: Endotracheal tube, Swan-Ganz catheter, mediastinal drainage tube, bilateral chest tubes in stable position. NG tube is again noted within the esophagus. Prior CABG and cardiac valve replacement. Left atrial appendage clip. Stable mediastinal and cardiac prominence noted. Stable bilateral subsegmental atelectasis. No prominent pleural effusion or pneumothorax. IMPRESSION: 1. Lines and tubes including bilateral chest tubes in stable position. NG tube is again noted within the esophagus. No pneumothorax. 2. Prior CABG and cardiac valve  replacement. Left atrial appendage clip noted. Stable prominence of the cardiomediastinal silhouette. 3.  Stable subsegmental atelectatic changes both lungs. Electronically Signed   By: Marcello Moores  Register   On: 02/16/2017 07:41   Dg Chest Port 1 View  Result Date: 02/15/2017 CLINICAL DATA:  Postop from CABG and aortic valve replacement. Atelectasis. EXAM: PORTABLE CHEST 1 VIEW COMPARISON:  02/13/2017 FINDINGS: Swan-Ganz catheter tip is seen in the proximal right pulmonary artery. Bilateral chest tubes are seen in appropriate position, and no pneumothorax visualized. Endotracheal tube is in appropriate position. Nasogastric tube is looped within the thoracic esophagus. Mediastinal drains are also seen. Patient has undergone CABG and aortic valve replacement. Mild cardiomegaly noted. Atelectasis is seen in the central right upper and left lower lobes. IMPRESSION: Atelectasis in central right upper and left lower lobes. No evidence of pneumothorax. Abnormal position of nasogastric tube, which is looped within the thoracic esophagus. These results will be called to the ordering clinician or representative by the Radiologist Assistant, and communication documented in the PACS or zVision Dashboard.  Electronically Signed   By: Earle Gell M.D.   On: 02/15/2017 19:45   Dg Chest Port 1 View  Result Date: 02/13/2017 CLINICAL DATA:  Chest pain EXAM: PORTABLE CHEST 1 VIEW COMPARISON:  Chest radiograph January 10, 2017 and chest CT Feb 11, 2017 FINDINGS: There is cardiac enlargement; recent chest for CT demonstrated pericardial effusion. Aorta is prominent; recent CT demonstrated aortic dissection. There is mild atelectasis in the right mid lung. Lungs elsewhere clear. No adenopathy. There is degenerative change in each shoulder with superior migration of each humeral head. IMPRESSION: Aortic prominence consistent with known thoracic aortic dissection. There is aortic atherosclerosis. There is cardiomegaly with known pericardial effusion. Mild right midlung atelectasis. No edema or consolidation. Chronic rotator cuff tears bilaterally. Electronically Signed   By: Lowella Grip III M.D.   On: 02/13/2017 07:14   Dg Swallowing Func-speech Pathology  Result Date: 02/19/2017 Objective Swallowing Evaluation: Type of Study: MBS-Modified Barium Swallow Study Patient Details Name: Lydia Jordan MRN: [id number] Date of Birth: 04/19/1934 Today's Date: 02/19/2017 Time: SLP Start Time (ACUTE ONLY): 1156-SLP Stop Time (ACUTE ONLY): 1220 SLP Time Calculation (min) (ACUTE ONLY): 24 min Past Medical History: Past Medical History: Diagnosis Date . Acid reflux  . Arthritis  . Ascending aortic aneurysm ([place]) 01/11/2017 . Ascending aortic dissection ([place]) 02/11/2017 . CAD (coronary artery disease)   Cath 01/11/17 showed 50% ost RCA and 50% mLAD --> medical therapy  . Cervical disc disease  . Chronic pain   Secondary to degenerative disc disease and arthritis . Degenerative arthritis  . Hydronephrosis of right kidney  . Hypertension  . Hypothyroidism  . Hypothyroidism  . Longstanding persistent atrial fibrillation ([place])   originally diagnosed 2007 . NSTEMI (non-ST elevated myocardial infarction) ([place]) 01/10/2017 . Pubic ramus  fracture, right, closed, initial encounter ([place]) 02/12/2017 . S/P ascending aortic dissection repair 02/15/2017  Straight graft replacement of sub-acute ascending aortic dissection with hemi-arch distal aortic reconstruction . 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 AORTIC VALVE REPLACEMENT N/A 02/15/2017  Procedure: AORTIC VALVE REPLACEMENT (AVR);  Surgeon: [name], MD;  Location: [place];  Service: Open Heart Surgery;  Laterality: N/A; . BENTALL PROCEDURE N/A 02/15/2017  Procedure: BENTALL AORTIC ROOT REPLACEMENT PROCEDURE, REPAIR ASCENDING AORTIC DISSECTION;  Surgeon: [name], MD;  Location: [place];  Service: Open Heart Surgery;  Laterality: N/A; . CLIPPING OF ATRIAL APPENDAGE Left 02/15/2017  Procedure: CLIPPING OF ATRIAL APPENDAGE;  Surgeon: [name], MD;  Location: [place];  Service: Open Heart Surgery;  Laterality: Left; . CORONARY ARTERY BYPASS GRAFT N/A 02/15/2017  Procedure: CORONARY ARTERY BYPASS GRAFTING (CABG) x  one, using left saphenous vein;  Surgeon: [name], MD;  Location: [facility] OR;  Service: Vascular;  Laterality: N/A; . ESOPHAGEAL MANOMETRY N/A 07/31/2015  Procedure: ESOPHAGEAL MANOMETRY (EM);  Surgeon: Arta Silence, MD;  Location: [facility];  Service: Endoscopy;  Laterality: N/A; . ESOPHAGOGASTRODUODENOSCOPY (EGD) WITH PROPOFOL N/A 09/02/2015  Procedure: ESOPHAGOGASTRODUODENOSCOPY (EGD) WITH PROPOFOL;  Surgeon: Arta Silence, MD;  Location: [facility];  Service: Endoscopy;  Laterality: N/A;  botulinum toxin injection (100 Units; 25 units in 4 quadrants 1-2 cm proximal to GE junction . LEFT HEART CATH AND CORONARY ANGIOGRAPHY N/A 01/11/2017  Procedure: Left Heart Cath  and Coronary Angiography;  Surgeon: Leonie Man, MD;  Location: [place] [facility];  Service: Cardiovascular;  Laterality: N/A; . PERICARDIAL FLUID DRAINAGE  02/15/2017  Procedure: DRAINAGE OF PERICARDIAL FLUID;  Surgeon: [name], MD;  Location: [place];  Service: Vascular;; . skin cancer area removed     right leg healing well . TEE WITHOUT CARDIOVERSION N/A 02/15/2017  Procedure: TRANSESOPHAGEAL ECHOCARDIOGRAM (TEE);  Surgeon: [name], MD;  Location: [place];  Service: Open Heart Surgery;  Laterality: N/A; . VAGINAL HYSTERECTOMY   HPI: Pt is an 83 y.o. female with PMH of atrial fibrillation on eliquis, HTN, hypothyroidism, acid reflux, recent NSTEMI, aortic dissection. Pt had a mechanical fall on 5/9, during which time she bruised her face and hip. She had pain after the fall and felt she needed to be seen, therefore presenting 5/12. Given that she is on anticoagulation, she underwent CT of head and pelvis. Initial CT concerning for unclear mass vs. Consolidation at left lung apex, so repeat CT with contrast performed. CT showed type A dissection. Underwent CT surgery on 5/16 and was extubated 5/17. CXR 5/19 showed mild bilateral atelectasis. Bedside swallow eval ordered due to coughing observed with oral intake per MD. Pt currently on dysphagia 1 diet/ nectar thick liquids. barium swallow 06/2015: "Significant tertiary contractions in the mid and distal esophagus; short segment stricture of the distal esophagus just above the small hiatal hernia. The barium pill lodges at the level of the short segment distal esophageal stricture; prominent cricopharyngeus muscle. " Pt reports botox tx to esophagus.   Subjective: alert, communicative Assessment / Plan / Recommendation CHL IP CLINICAL IMPRESSIONS 02/19/2017 Clinical Impression Pt presents with a functional oropharyngeal swallow, marked by high penetration of thin liquids occasionally, not considered disordered in the elderly.  There was adequate  mastication; good pharyngeal clearance of POs; no aspiration.  Prominent cricopharyngeus was evident, and esophageal sweep revealed barium stasis filling the esophageal column and reaching cervical level.  Pt acknowledges hx of esophageal deficits.  For now, recommend changing diet to full liquids (thin liquids/straws permitted); pt should sit upright for meals and 45 minutes after.  She may benefit from repeat esophageal w/u when appropriate.  She is at risk for backflow/aspiration of esophageal material.  RN present; discussed results/recommendations.  SLP Visit Diagnosis Dysphagia, pharyngoesophageal phase (R13.14) Attention and concentration deficit following -- Frontal lobe and executive function deficit following -- Impact on safety and  function Mild aspiration risk   CHL IP TREATMENT RECOMMENDATION 02/19/2017 Treatment Recommendations Therapy as outlined in treatment plan below   Prognosis 02/18/2017 Prognosis for Safe Diet Advancement (No Data) Barriers to Reach Goals -- Barriers/Prognosis Comment -- CHL IP DIET RECOMMENDATION 02/19/2017 SLP Diet Recommendations Other (Comment) Liquid Administration via Straw Medication Administration Crushed with puree Compensations Follow solids with liquid Postural Changes Seated upright at 90 degrees   CHL IP OTHER RECOMMENDATIONS 02/19/2017 Recommended Consults Consider esophageal assessment Oral Care Recommendations Oral care BID Other Recommendations --   CHL IP FOLLOW UP RECOMMENDATIONS 02/19/2017 Follow up Recommendations (No Data)   CHL IP FREQUENCY AND DURATION 02/19/2017 Speech Therapy Frequency (ACUTE ONLY) min 2x/week Treatment Duration 1 week      CHL IP ORAL PHASE 02/19/2017 Oral Phase WFL Oral - Pudding Teaspoon -- Oral - Pudding Cup -- Oral - Honey Teaspoon -- Oral - Honey Cup -- Oral - Nectar Teaspoon -- Oral - Nectar Cup -- Oral - Nectar Straw -- Oral - Thin Teaspoon -- Oral - Thin Cup -- Oral - Thin Straw -- Oral - Puree -- Oral - Mech Soft -- Oral - Regular --  Oral - Multi-Consistency -- Oral - Pill -- Oral Phase - Comment --  CHL IP PHARYNGEAL PHASE 02/19/2017 Pharyngeal Phase WFL Pharyngeal- Pudding Teaspoon -- Pharyngeal -- Pharyngeal- Pudding Cup -- Pharyngeal -- Pharyngeal- Honey Teaspoon -- Pharyngeal -- Pharyngeal- Honey Cup -- Pharyngeal -- Pharyngeal- Nectar Teaspoon -- Pharyngeal -- Pharyngeal- Nectar Cup -- Pharyngeal -- Pharyngeal- Nectar Straw -- Pharyngeal -- Pharyngeal- Thin Teaspoon -- Pharyngeal -- Pharyngeal- Thin Cup -- Pharyngeal -- Pharyngeal- Thin Straw -- Pharyngeal -- Pharyngeal- Puree -- Pharyngeal -- Pharyngeal- Mechanical Soft -- Pharyngeal -- Pharyngeal- Regular -- Pharyngeal -- Pharyngeal- Multi-consistency -- Pharyngeal -- Pharyngeal- Pill -- Pharyngeal -- Pharyngeal Comment --  CHL IP CERVICAL ESOPHAGEAL PHASE 02/19/2017 Cervical Esophageal Phase Impaired Pudding Teaspoon -- Pudding Cup -- Honey Teaspoon -- Honey Cup -- Nectar Teaspoon -- Nectar Cup -- Nectar Straw -- Thin Teaspoon -- Thin Cup -- Thin Straw -- Puree -- Mechanical Soft -- Regular -- Multi-consistency -- Pill -- Cervical Esophageal Comment (No Data) No flowsheet data found. [name] 02/19/2017, 1:11 PM              Ct Maxillofacial Wo Contrast  Result Date: 02/11/2017 CLINICAL DATA:  Patient fell Wednesday and hit the right side of her head and face on curb. Swelling and bruising on right side around orbit and frontal region. Patient has previous history of loss of disk height in her cervical spine EXAM: CT HEAD WITHOUT CONTRAST CT MAXILLOFACIAL WITHOUT CONTRAST CT CERVICAL SPINE WITHOUT CONTRAST TECHNIQUE: Multidetector CT imaging of the head, cervical spine, and maxillofacial structures were performed using the standard protocol without intravenous contrast. Multiplanar CT image reconstructions of the cervical spine and maxillofacial structures were also generated. COMPARISON:  Head CT dated 02/10/2015. MRI of the cervical spine dated 07/20/2016. FINDINGS: CT  HEAD FINDINGS Brain: There is mild generalized parenchymal atrophy with commensurate dilatation of the ventricles and sulci. Mild chronic small vessel ischemic changes noted within the periventricular and subcortical white matter. There is no mass, hemorrhage, edema or other evidence of acute parenchymal abnormality. No extra-axial hemorrhage. Vascular: There are chronic calcified atherosclerotic changes of the large vessels at the skull base. No unexpected hyperdense vessel. Skull: Chronic-appearing deformity of the right zygoma and right lateral orbital wall. No acute appearing fracture or displacement. Other: Soft tissue hematoma overlying the right frontal bone, measuring  approximately 1.7 x 1 cm. No underlying fracture. CT MAXILLOFACIAL FINDINGS Osseous: Lower frontal bones are intact and normally aligned. Old mild deformity of the right lateral orbital wall, stable compared to the earlier head CT from 2016. Osseous structures about the orbits are otherwise intact and normally aligned bilaterally. No displaced nasal bone fracture. Old healed fracture of the right zygoma. Left zygoma is intact and normally aligned. Bilateral pterygoid plates appear intact and normally aligned. Walls of the maxillary sinuses are intact and normally aligned bilaterally. Temporal bones appear intact and normally aligned. Orbits: Negative. No traumatic or inflammatory finding. Sinuses: Clear. Soft tissues: Unremarkable. CT CERVICAL SPINE FINDINGS Alignment: There is reversal of the normal cervical spine lordosis, likely related to the underlying degenerative changes, stable compared to the earlier MRI given patient positioning. No evidence of acute vertebral body subluxation. Skull base and vertebrae: No fracture line or displaced fracture fragment identified. Soft tissues and spinal canal: No prevertebral fluid or swelling. No visible canal hematoma. Disc levels: Degenerative changes throughout the cervical spine, with associated  disc space narrowings and osseous spurring. Most significant degenerative change at the C5-6 and C6-7 levels with complete loss of disc space height and associated osseous spurring, likely source for the overlying reversal of normal cervical spine lordosis. There is, however, no more than mild central canal stenosis at any level. Degenerative hypertrophy of the uncovertebral and facet joints causing severe bilateral neural foramen stenoses at C3-4, severe bilateral neural foramen stenoses at C4-5, severe right neural foramen stenosis at C5-6 and moderate to severe bilateral neural foramen stenoses at C6-7. Suspect associated nerve root impingement at 1 or more levels. Upper chest: Mass versus consolidation at the left lung apex, incompletely imaged at the lower aspects of the chest CT. Other: Carotid atherosclerosis. IMPRESSION: 1. No acute intracranial abnormality. No intracranial mass, hemorrhage or edema. Chronic small vessel ischemic changes in the white matter. 2. Focal soft tissue hematoma overlying the right frontal bone. No underlying fracture. 3. No acute facial bone fracture or displacement. Old healed fractures of the right zygoma and right lateral orbital wall. 4. No fracture or acute subluxation within the cervical spine. Degenerative changes of the cervical spine, at least moderate in degree, as detailed above. 5. Masslike density at the left lung apex, incompletely imaged at the lower aspects of this chest CT. This likely represents the upper aspect of the ectatic thoracic aortic arch, but would consider chest CT to confirm benignity. These results were called by telephone at the time of interpretation on 02/11/2017 at 6:15 pm to Dr. Maryan Rued, who verbally acknowledged these results. Electronically Signed   By: Franki Cabot M.D.   On: 02/11/2017 18:18    ECG & Cardiac Imaging   TEE 02/15/17: Result status: Final result   Left ventricle: Normal cavity size. Concentric hypertrophy of severe  severity. LV systolic function is normal with an EF of 55-60%. There are no obvious wall motion abnormalities. No thrombus present. No mass present.  Aortic valve: The valve is trileaflet. No stenosis. Moderate regurgitation.  Aorta: Aneurysm present in the ascending aorta. Dissection present from the aortic root to the descending aorta.  Mitral valve: No leaflet thickening and calcification present. Mild regurgitation.  Right ventricle: Normal cavity size, wall thickness and ejection fraction. No thrombus present. No mass present.  Tricuspid valve: Trace regurgitation.  Pericardium: Moderate circumferential pericardial effusion. Effusion is fluid.  Aorta: Graft present in the ascending aorta.  Right ventricle: Cavity is mildly dilated. Moderately reduced systolic function.  Echocardiogram 02/12/17: Study Conclusions - Left ventricle: The cavity size was normal. Wall thickness was   increased increased in a pattern of mild to moderate LVH.   Systolic function was normal. The estimated ejection fraction was   in the range of 55% to 60%.  - Aortic valve: Mildly calcified annulus. There was mild to   moderate regurgitation. - Aorta: There is a dissection flap with false lumen seen in the   ascending aorta with max dimension of 5.0 cm at least. The aorta   was severely abnormal and moderately dilated. - Mitral valve: There was moderate regurgitation. - Left atrium: The atrium was moderately to severely dilated. - Right atrium: The atrium was moderately dilated. - Tricuspid valve: There was mild-moderate regurgitation. - Pulmonary arteries: Systolic pressure was moderately increased.   PA peak pressure: 53 mm Hg (S). - Pericardium, extracardiac: A moderate to large pericardial   effusion was identified circumferential to the heart. Features   were consistent with mild tamponade physiology.   Assessment & Plan    1. Acute on chronic diastolic heart failure - BNP 276.7 this  admission - echo with LVH, Normal ejection fraction - she is currently on 20 mg lasix BID; we will increase this to 60 mg IV BID - need strict I&Os and daily weights, daughter states that she is 20 pounds heavier - she is net positive this admission - weight 149 lbs today, 158 lb on 02/21/17; admission weight not available; it is difficult to determine her dry weight - sCr 0.64 (0.62) - continue coreg and digoxin for now - need daiy BMP   2. Long-standing persistent atrial fibrillation - rate controlled, continue digoxin for now - on Eliquis (reduced dose for age and weight). Dry weight IV believe has been less than 60 kg   3. Acute confusion/encephalopathy - confusion may be related to her hyponatremia, Na 129 today - agree with fluid restriction - hopefully lasix will help correct her fluid status and sodium imbalance, which may help with her confusion - head CT on 02/11/17, after her fall, negative for acute abnormalities   Bobby Rumpf, PA-C 02/22/2017, 2:04 PM [phone]  Personally seen and examined. Agree with above. Changes have been made to note above. 83 year old female with acute diastolic heart failure, recent CABG, aortic valve replacement, permanent atrial fibrillation on chronic anticoagulation.  Physical exam is consistent with fluid overload, bilateral lower extremity edema, confusion, irregularly irregular heart sounds, bypass scar well healing.  - Pedal edema noted.  - Increasing Lasix to 60 mg IV twice a day.  - Regarding Eliquis, with her current weight, greater than 60 kg, dosing should be 5 mg twice a day, however her daughter states that she is up 20 pounds and she would be below 60 kg if she was at dry weight.  We will follow along.  Candee Furbish, MD

## 2017-02-22 NOTE — Progress Notes (Signed)
   02/22/17 1315  Clinical Encounter Type  Visited With Patient not available  Visit Type Other (Comment) ([place] consult)  Spiritual Encounters  Spiritual Needs Emotional  Stress Factors  Patient Stress Factors Not reviewed  Stopped by room. Pt at procedure. Follow up later.

## 2017-02-22 NOTE — Progress Notes (Addendum)
      [address]       [place] [address]             [phone]     CARDIOTHORACIC SURGERY PROGRESS NOTE          7 Days Post-Op Procedure(s) (LRB): BENTALL AORTIC ROOT REPLACEMENT PROCEDURE, REPAIR ASCENDING AORTIC DISSECTION (N/A) TRANSESOPHAGEAL ECHOCARDIOGRAM (TEE) (N/A) CLIPPING OF ATRIAL APPENDAGE (Left) DRAINAGE OF PERICARDIAL FLUID CORONARY ARTERY BYPASS GRAFTING (CABG) x  one, using left saphenous vein (N/A) AORTIC VALVE REPLACEMENT (AVR) (N/A)   Subjective: Feels well.  Denies pain, SOB.  No complaints this morning  Objective: Vital signs in last 24 hours: Temp:  [98.2 F (36.8 C)-98.6 F (37 C)] 98.6 F (37 C) (05/23 0501) Pulse Rate:  [91-97] 97 (05/23 0501) Cardiac Rhythm: Atrial fibrillation (05/22 0830) Resp:  [18-20] 18 (05/23 0501) BP: (136-139)/(83-89) 136/89 (05/23 0501) SpO2:  [97 %-100 %] 97 % (05/23 0501) Weight:  [158 lb 1.1 oz (71.7 kg)-158 lb 9.6 oz (71.9 kg)] 158 lb 1.1 oz (71.7 kg) (05/23 0501)  Physical Exam:  Rhythm:   Afib  Breath sounds: clear  Heart sounds:  irregular  Incisions:  Clean and dry  Abdomen:  Soft, non-distended, non-tender  Extremities:  Warm, well-perfused, edema improved   Intake/Output from previous day: 05/22 0701 - 05/23 0700 In: 40 [I.V.:40] Out: 10 [Drains:10] Intake/Output this shift: No intake/output data recorded.  Lab Results:  Recent Labs  02/21/17 0453 02/22/17 0432  WBC 15.8* 18.9*  HGB 10.5* 10.2*  HCT 33.1* 32.3*  PLT 276 365   BMET:  Recent Labs  02/21/17 0453 02/22/17 0432  NA 133* 129*  K 4.3 4.4  CL 95* 91*  CO2 32 30  GLUCOSE 88 103*  BUN 17 16  CREATININE 0.62 0.64  CALCIUM 8.2* 8.5*    CBG (last 3)   Recent Labs  02/20/17 0833 02/20/17 2018 02/21/17 1134  GLUCAP 81 142* 82   PT/INR:  No results for input(s): LABPROT, INR in the last 72 hours.  CXR:  N/A  Assessment/Plan:  Overall making good progress.  Serous drainage from left thigh JP  drain appears to be resolving - will get drain out.  WBC up a little today.  Incisions look good.  Will check UA and urine culture.  Monitor for fever or other signs of infection, but overall she looks good.  Stop ASA now that she is on Eliquis.  Would recommend following daily weights to help decide about diuretic dosing.  Appreciate care of CIR team.  Will continue to follow intermittently but please call if there are specific problems or questions.  [name], MD 02/22/2017 8:15 AM

## 2017-02-22 NOTE — Care Management Note (Signed)
[facility] Individual Statement of Services  Patient Name:  Lydia Jordan  Date:  02/22/2017  Welcome to the [facility].  Our goal is to provide you with an individualized program based on your diagnosis and situation, designed to meet your specific needs.  With this comprehensive rehabilitation program, you will be expected to participate in at least 3 hours of rehabilitation therapies Monday-Friday, with modified therapy programming on the weekends.  Your rehabilitation program will include the following services:  Physical Therapy (PT), Occupational Therapy (OT), Speech Therapy (ST), 24 hour per day rehabilitation nursing, Neuropsychology, Case Management (Social Worker), Rehabilitation Medicine, Nutrition Services and Pharmacy Services  Weekly team conferences will be held on Wednesday to discuss your progress.  Your Social Worker will talk with you frequently to get your input and to update you on team discussions.  Team conferences with you and your family in attendance may also be held.  Expected length of stay: 2.5-3 weeks   Overall anticipated outcome: supervision with cueing  Depending on your progress and recovery, your program may change. Your Social Worker will coordinate services and will keep you informed of any changes. Your Social Worker's name and contact numbers are listed  below.  The following services may also be recommended but are not provided by the [facility] will be made to provide these services after discharge if needed.  Arrangements include referral to agencies that provide these services.  Your insurance has been verified to be:  [employer] Your primary doctor is:  Lavone Orn  Pertinent information will be shared with your doctor and your insurance company.  Social  Worker:  Ovidio Kin, [place] or ([phone]  Information discussed with and copy given to patient by: Elease Hashimoto, 02/22/2017, 1:50 PM

## 2017-02-23 ENCOUNTER — Inpatient Hospital Stay (HOSPITAL_COMMUNITY)
Admission: AD | Admit: 2017-02-23 | Discharge: 2017-03-03 | DRG: 296 | Disposition: E | Payer: Medicare Other | Source: Ambulatory Visit | Attending: Internal Medicine | Admitting: Internal Medicine

## 2017-02-23 ENCOUNTER — Inpatient Hospital Stay (HOSPITAL_COMMUNITY): Payer: Medicare Other | Admitting: Speech Pathology

## 2017-02-23 ENCOUNTER — Inpatient Hospital Stay (HOSPITAL_COMMUNITY): Payer: Medicare Other | Admitting: Occupational Therapy

## 2017-02-23 ENCOUNTER — Inpatient Hospital Stay (HOSPITAL_COMMUNITY): Payer: Medicare Other

## 2017-02-23 ENCOUNTER — Inpatient Hospital Stay (HOSPITAL_COMMUNITY): Payer: Medicare Other | Admitting: Physical Therapy

## 2017-02-23 DIAGNOSIS — Z66 Do not resuscitate: Secondary | ICD-10-CM | POA: Diagnosis present

## 2017-02-23 DIAGNOSIS — Z888 Allergy status to other drugs, medicaments and biological substances status: Secondary | ICD-10-CM | POA: Diagnosis not present

## 2017-02-23 DIAGNOSIS — D72829 Elevated white blood cell count, unspecified: Secondary | ICD-10-CM

## 2017-02-23 DIAGNOSIS — I5033 Acute on chronic diastolic (congestive) heart failure: Secondary | ICD-10-CM | POA: Diagnosis present

## 2017-02-23 DIAGNOSIS — Z952 Presence of prosthetic heart valve: Secondary | ICD-10-CM

## 2017-02-23 DIAGNOSIS — K219 Gastro-esophageal reflux disease without esophagitis: Secondary | ICD-10-CM | POA: Diagnosis present

## 2017-02-23 DIAGNOSIS — G894 Chronic pain syndrome: Secondary | ICD-10-CM | POA: Diagnosis present

## 2017-02-23 DIAGNOSIS — D72828 Other elevated white blood cell count: Secondary | ICD-10-CM | POA: Diagnosis not present

## 2017-02-23 DIAGNOSIS — Z515 Encounter for palliative care: Secondary | ICD-10-CM | POA: Diagnosis not present

## 2017-02-23 DIAGNOSIS — R0602 Shortness of breath: Secondary | ICD-10-CM

## 2017-02-23 DIAGNOSIS — J9601 Acute respiratory failure with hypoxia: Secondary | ICD-10-CM | POA: Diagnosis present

## 2017-02-23 DIAGNOSIS — I252 Old myocardial infarction: Secondary | ICD-10-CM

## 2017-02-23 DIAGNOSIS — I5031 Acute diastolic (congestive) heart failure: Secondary | ICD-10-CM | POA: Diagnosis not present

## 2017-02-23 DIAGNOSIS — Z79899 Other long term (current) drug therapy: Secondary | ICD-10-CM | POA: Diagnosis not present

## 2017-02-23 DIAGNOSIS — I469 Cardiac arrest, cause unspecified: Secondary | ICD-10-CM | POA: Diagnosis not present

## 2017-02-23 DIAGNOSIS — G9341 Metabolic encephalopathy: Secondary | ICD-10-CM | POA: Diagnosis present

## 2017-02-23 DIAGNOSIS — Z7982 Long term (current) use of aspirin: Secondary | ICD-10-CM | POA: Diagnosis not present

## 2017-02-23 DIAGNOSIS — E039 Hypothyroidism, unspecified: Secondary | ICD-10-CM | POA: Diagnosis present

## 2017-02-23 DIAGNOSIS — I110 Hypertensive heart disease with heart failure: Secondary | ICD-10-CM | POA: Diagnosis present

## 2017-02-23 DIAGNOSIS — Z7901 Long term (current) use of anticoagulants: Secondary | ICD-10-CM

## 2017-02-23 DIAGNOSIS — Z8249 Family history of ischemic heart disease and other diseases of the circulatory system: Secondary | ICD-10-CM

## 2017-02-23 DIAGNOSIS — R5381 Other malaise: Secondary | ICD-10-CM | POA: Diagnosis not present

## 2017-02-23 DIAGNOSIS — M1990 Unspecified osteoarthritis, unspecified site: Secondary | ICD-10-CM | POA: Diagnosis present

## 2017-02-23 DIAGNOSIS — Z951 Presence of aortocoronary bypass graft: Secondary | ICD-10-CM | POA: Diagnosis not present

## 2017-02-23 DIAGNOSIS — Z87891 Personal history of nicotine dependence: Secondary | ICD-10-CM | POA: Diagnosis not present

## 2017-02-23 DIAGNOSIS — I482 Chronic atrial fibrillation: Secondary | ICD-10-CM

## 2017-02-23 LAB — BLOOD GAS, ARTERIAL
Acid-Base Excess: 4.1 mmol/L — ABNORMAL HIGH (ref 0.0–2.0)
Acid-Base Excess: 2.3 mmol/L — ABNORMAL HIGH (ref 0.0–2.0)
BICARBONATE: 25.9 mmol/L (ref 20.0–28.0)
Bicarbonate: 27.4 mmol/L (ref 20.0–28.0)
DRAWN BY: 290171
Drawn by: 331761
FIO2: 100
O2 Content: 5 L/min
O2 Saturation: 99.8 %
O2 Saturation: 99.2 %
PATIENT TEMPERATURE: 98.6
PCO2 ART: 37.5 mmHg (ref 32.0–48.0)
Patient temperature: 98.6
pCO2 arterial: 35.8 mmHg (ref 32.0–48.0)
pH, Arterial: 7.496 — ABNORMAL HIGH (ref 7.350–7.450)
pH, Arterial: 7.454 — ABNORMAL HIGH (ref 7.350–7.450)
pO2, Arterial: 285 mmHg — ABNORMAL HIGH (ref 83.0–108.0)
pO2, Arterial: 161 mmHg — ABNORMAL HIGH (ref 83.0–108.0)

## 2017-02-23 LAB — CBC WITH DIFFERENTIAL/PLATELET
BASOS ABS: 0 10*3/uL (ref 0.0–0.1)
BASOS PCT: 0 %
Eosinophils Absolute: 0.1 10*3/uL (ref 0.0–0.7)
Eosinophils Relative: 1 %
HEMATOCRIT: 29.1 % — AB (ref 36.0–46.0)
Hemoglobin: 9.4 g/dL — ABNORMAL LOW (ref 12.0–15.0)
LYMPHS PCT: 5 %
Lymphs Abs: 1.1 10*3/uL (ref 0.7–4.0)
MCH: 29.2 pg (ref 26.0–34.0)
MCHC: 32.3 g/dL (ref 30.0–36.0)
MCV: 90.4 fL (ref 78.0–100.0)
Monocytes Absolute: 2.7 10*3/uL — ABNORMAL HIGH (ref 0.1–1.0)
Monocytes Relative: 12 %
NEUTROS ABS: 18.3 10*3/uL — AB (ref 1.7–7.7)
NEUTROS PCT: 82 %
PLATELETS: 384 10*3/uL (ref 150–400)
RBC: 3.22 MIL/uL — AB (ref 3.87–5.11)
RDW: 14.7 % (ref 11.5–15.5)
WBC: 22.3 10*3/uL — AB (ref 4.0–10.5)

## 2017-02-23 LAB — URINE CULTURE: Culture: NO GROWTH

## 2017-02-23 LAB — BASIC METABOLIC PANEL
Anion gap: 7 (ref 5–15)
BUN: 19 mg/dL (ref 6–20)
CHLORIDE: 89 mmol/L — AB (ref 101–111)
CO2: 30 mmol/L (ref 22–32)
CREATININE: 0.68 mg/dL (ref 0.44–1.00)
Calcium: 8.4 mg/dL — ABNORMAL LOW (ref 8.9–10.3)
GFR calc Af Amer: >60 mL/min (ref >60)
GFR calc non Af Amer: >60 mL/min (ref >60)
Glucose, Bld: 125 mg/dL — ABNORMAL HIGH (ref 65–99)
Potassium: 4.9 mmol/L (ref 3.5–5.1)
SODIUM: 126 mmol/L — AB (ref 135–145)

## 2017-02-23 MED ORDER — POLYVINYL ALCOHOL 1.4 % OP SOLN
1.0000 [drp] | Freq: Four times a day (QID) | OPHTHALMIC | Status: DC | PRN
  Filled 2017-02-23: qty 15

## 2017-02-23 MED ORDER — HALOPERIDOL LACTATE 2 MG/ML PO CONC
0.5000 mg | ORAL | Status: DC | PRN
  Filled 2017-02-23: qty 0.3

## 2017-02-23 MED ORDER — MORPHINE BOLUS VIA INFUSION
1.0000 mg | INTRAVENOUS | Status: DC | PRN
  Administered 2017-02-24 (×6): 1 mg via INTRAVENOUS
  Filled 2017-02-23: qty 1

## 2017-02-23 MED ORDER — BIOTENE DRY MOUTH MT LIQD: 15.0000 mL | OROMUCOSAL | Status: DC | PRN

## 2017-02-23 MED ORDER — MORPHINE SULFATE (PF) 4 MG/ML IV SOLN
INTRAVENOUS | Status: AC
  Filled 2017-02-23: qty 1

## 2017-02-23 MED ORDER — TRAMADOL HCL 50 MG PO TABS
50.0000 mg | ORAL_TABLET | Freq: Once | ORAL | Status: AC
  Administered 2017-02-23: 50 mg via ORAL
  Filled 2017-02-23: qty 1

## 2017-02-23 MED ORDER — LIDOCAINE 5 % EX PTCH
1.0000 | MEDICATED_PATCH | CUTANEOUS | Status: DC
  Administered 2017-02-23: 1 via TRANSDERMAL
  Filled 2017-02-23: qty 1

## 2017-02-23 MED ORDER — ONDANSETRON 4 MG PO TBDP: 4.0000 mg | ORAL_TABLET | Freq: Four times a day (QID) | ORAL | Status: DC | PRN

## 2017-02-23 MED ORDER — SODIUM CHLORIDE 0.9 % IV SOLN
1.0000 mg/h | INTRAVENOUS | Status: DC
  Administered 2017-02-23: 1 mg/h via INTRAVENOUS
  Filled 2017-02-23: qty 5

## 2017-02-23 MED ORDER — MORPHINE SULFATE (PF) 4 MG/ML IV SOLN
4.0000 mg | Freq: Once | INTRAVENOUS | Status: AC
  Administered 2017-02-23: 4 mg via INTRAVENOUS
  Filled 2017-02-23: qty 1

## 2017-02-23 MED ORDER — HALOPERIDOL 0.5 MG PO TABS
0.5000 mg | ORAL_TABLET | ORAL | Status: DC | PRN
  Filled 2017-02-23: qty 1

## 2017-02-23 MED ORDER — GLYCOPYRROLATE 1 MG PO TABS: 1.0000 mg | ORAL_TABLET | ORAL | Status: DC | PRN

## 2017-02-23 MED ORDER — MORPHINE BOLUS VIA INFUSION: 4.0000 mg | INTRAVENOUS | Status: DC | PRN

## 2017-02-23 MED ORDER — SCOPOLAMINE 1 MG/3DAYS TD PT72
1.0000 | MEDICATED_PATCH | TRANSDERMAL | Status: DC
  Administered 2017-02-23: 1.5 mg via TRANSDERMAL
  Filled 2017-02-23 (×2): qty 1

## 2017-02-23 MED ORDER — LORAZEPAM 2 MG/ML IJ SOLN
0.5000 mg | INTRAMUSCULAR | Status: DC | PRN
  Administered 2017-02-23: 0.5 mg via INTRAVENOUS
  Filled 2017-02-23: qty 1

## 2017-02-23 MED ORDER — GLYCOPYRROLATE 0.2 MG/ML IJ SOLN
0.2000 mg | INTRAMUSCULAR | Status: DC | PRN
  Filled 2017-02-23: qty 1

## 2017-02-23 MED ORDER — ONDANSETRON HCL 4 MG/2ML IJ SOLN: 4.0000 mg | Freq: Four times a day (QID) | INTRAMUSCULAR | Status: DC | PRN

## 2017-02-23 MED ORDER — GLYCOPYRROLATE 1 MG PO TABS
1.0000 mg | ORAL_TABLET | ORAL | Status: DC | PRN
  Filled 2017-02-23: qty 1

## 2017-02-23 MED ORDER — ACETAMINOPHEN 325 MG PO TABS: 650.0000 mg | ORAL_TABLET | Freq: Four times a day (QID) | ORAL | Status: DC | PRN

## 2017-02-23 MED ORDER — ACETAMINOPHEN 650 MG RE SUPP: 650.0000 mg | Freq: Four times a day (QID) | RECTAL | Status: DC | PRN

## 2017-02-23 MED ORDER — MORPHINE SULFATE (PF) 4 MG/ML IV SOLN: 2.0000 mg | INTRAVENOUS | Status: DC | PRN

## 2017-02-23 MED ORDER — POTASSIUM CHLORIDE 20 MEQ PO PACK
20.0000 meq | PACK | Freq: Three times a day (TID) | ORAL | Status: DC
  Administered 2017-02-23: 20 meq via ORAL
  Filled 2017-02-23 (×2): qty 1

## 2017-02-23 MED ORDER — MORPHINE BOLUS VIA INFUSION
1.0000 mg | INTRAVENOUS | Status: DC | PRN
  Filled 2017-02-23: qty 1

## 2017-02-23 MED ORDER — GLYCOPYRROLATE 0.2 MG/ML IJ SOLN: 0.2000 mg | INTRAMUSCULAR | Status: DC | PRN

## 2017-02-23 MED ORDER — SODIUM CHLORIDE 0.9 % IV SOLN
1.0000 mg/h | INTRAVENOUS | Status: DC
  Filled 2017-02-23: qty 10

## 2017-02-23 MED ORDER — HALOPERIDOL LACTATE 5 MG/ML IJ SOLN
0.5000 mg | INTRAMUSCULAR | Status: DC | PRN
  Filled 2017-02-23: qty 0.1

## 2017-02-23 MED ORDER — GLYCOPYRROLATE 0.2 MG/ML IJ SOLN
0.2000 mg | INTRAMUSCULAR | Status: DC | PRN
  Administered 2017-02-24 (×3): 0.2 mg via INTRAVENOUS
  Filled 2017-02-23 (×3): qty 1

## 2017-02-23 NOTE — Progress Notes (Signed)
Speech Language Pathology Daily Session Note  Patient Details  Name: Lydia Jordan MRN: [id number] Date of Birth: 02/29/1934  Today's Date: 02/05/2017 SLP Individual Time: 1308-6578 SLP Individual Time Calculation (min): 55 min  Short Term Goals: Week 1: SLP Short Term Goal 1 (Week 1): Patient will demonstrate sustained attention to functional task for ~2 minutes with Max A multimodal cues.  SLP Short Term Goal 2 (Week 1): Patient will initiate verbal responses to 50% of questions with Max A multimodal cues.  SLP Short Term Goal 3 (Week 1): Patient will follow 1 step commands in 50% of opportunities with Max A multimodal cues.  SLP Short Term Goal 4 (Week 1): Patient will orient to place, time and situation with Max A multimodal cues.  SLP Short Term Goal 5 (Week 1): Patient will conusme current diet with minimal overt s/s of aspiration with Mod A verbal cues for use of swallowing compensatory strategies.   Skilled Therapeutic Interventions: Skilled treatment session focused on cognitive goals. Upon arrival, patient was awake while sitting upright in recliner but appeared restless. Throughout session, patient repositioning herself constantly throughout session with reports of 10/10 pain in chest. RN aware. SLP facilitated session by providing encouragement for use of deep breathing to help decrease suspected anxiety. SLP also facilitated session by administering the MoCA-BLIND. Patient scored 10/22 points with a score of 18 or above considered normal. Patient demonstrated deficits in attention, immediate and short-term recall. Suspect patient's function was impacted by internal distraction of pain throughout session. Both the patient and her daughter educated in regards to current cognitive function and goals of SLP. Both verbalized understanding. Patient left upright in recliner with daughter present. Continue with current plan of care.      Function:  Cognition Comprehension Comprehension  assist level: Understands basic 50 - 74% of the time/ requires cueing 25 - 49% of the time  Expression   Expression assist level: Expresses basic 50 - 74% of the time/requires cueing 25 - 49% of the time. Needs to repeat parts of sentences.  Social Interaction    Problem Solving Problem solving assist level: Solves basic 25 - 49% of the time - needs direction more than half the time to initiate, plan or complete simple activities  Memory Memory assist level: Recognizes or recalls 25 - 49% of the time/requires cueing 50 - 75% of the time    Pain 10/10 chest pain. RN aware and patient premedicated   Therapy/Group: Individual Therapy  Loa Idler 02/17/2017, 3:13 PM

## 2017-02-23 NOTE — Progress Notes (Signed)
Social Work Jenney Brester, Eliezer Champagne Social Worker Signed   Patient Care Conference Date of Service: 02/22/2017  1:53 PM      Hide copied text Hover for attribution information Inpatient RehabilitationTeam Conference and Plan of Care Update Date: 02/22/2017   Time: 11:25 AM      Patient Name: Lydia Jordan      Medical Record Number: [id number]  Date of Birth: [birth year] Sex: Female         Room/Bed: 4M02C/4M02C-01 Payor Info: Payor: MEDICARE / Plan: MEDICARE PART A AND B / Product Type: *No Product type* /     Admitting Diagnosis: fall with right puic rami fracture   Admit Date/Time:  02/21/2017  3:34 PM Admission Comments: No comment available    Primary Diagnosis:  Physical deconditioning Principal Problem: Physical deconditioning       Patient Active Problem List    Diagnosis Date Noted  . Debility 02/21/2017  . Acute on chronic diastolic heart failure ([facility])    . Chronic diastolic congestive heart failure ([facility])    . S/P AVR    . Dysphagia    . Tachypnea    . Hypokalemia    . Leukocytosis    . S/P ascending aortic dissection repair 02/15/2017  . 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 S/P CABG x 1 02/15/2017  . Pubic ramus fracture, right, closed, initial encounter ([facility]) 02/12/2017  . Chronic pain    . Cervical disc disease    . Degenerative arthritis    . Physical deconditioning    . Fall    . Closed fracture of multiple pubic rami ([facility] [facility])    . Anemia    . Hydronephrosis of right kidney    . Ascending aortic dissection ([facility]) 02/11/2017  . Pericardial effusion 02/11/2017  . S/P cardiac cath: (2018) a. mild to moderate disease but no flow limiting lesions with perserved EF. b. large-caliber almost ectatic vessels for patients age. 01/12/2017  . Hypertension 01/12/2017  . CAD (coronary artery disease): mild to moderate non-obstructive 01/12/2017  . Epistaxis: (2018) cauterized by Dr. Erik Obey 44/96/7591  . Longstanding persistent  atrial fibrillation ([facility])    . Ascending aortic aneurysm ([facility]) 01/11/2017  . NSTEMI (non-ST elevated myocardial infarction) ([facility]) 01/10/2017      Expected Discharge Date:     Team Members Present: Physician leading conference: Dr. Alysia Penna Social Worker Present: Ovidio Kin, LCSW Nurse Present: Other (comment) Alda Lea) PT Present: Dwyane Dee, PT OT Present: Cherylynn Ridges, OT SLP Present: Weston Anna, SLP PPS Coordinator present : Daiva Nakayama, RN, CRRN       Current Status/Progress Goal Weekly Team Focus  Medical       chest x-ray and UA checking   medical stability     Bowel/Bladder     incontinent at times/ [facility] 02/19/17 /UA will be collected this AM  remain continent of bowel and bladder  monitor bowel and bladder q shift   Swallow/Nutrition/ Hydration     Dys. 3 textures with thin liquids, Intermittent supervision   Mod I  use of swallowing compensatory strategies    ADL's     Max A overall  Min A-supervision  cognition, awareness, sternal precautions, transfers, modified bathing/dressing   Mobility     mod/max for all mobility, cognitively impaired limiting ability to follow 1-step commands consistently or attend to functional tasks without max/total cues  supervision overall  cognitive remediation, functional mobility, recall of sternal precautions, balance, activity tolerance  Communication               Safety/Cognition/ Behavioral Observations   Max A  Supervision   attention, problem solving, recall    Pain     chronic neck pain/tylenol 650mg /Pain to right arm, leg  pain less than or equal to 2  monitor pain q shif   Skin     bruising to right side sde, forehead, arm, leg/ dressing change to  left leg incisions/ paint with betadine cover with dry dressing  no new skin breakdown this admission/infection  monitor skin q shift     Rehab Goals Patient on target to meet rehab goals: Yes *See Care Plan and progress notes for long and  short-term goals.   Barriers to Discharge:   Medical issues-adjusting meds and checking chest x-ray    Possible Resolutions to Barriers:    resolve by dc    Discharge Planning/Teaching Needs:    Home with daughter who works form home but at times does need to leave for appointments. Will await therapy goals to see what she will need to do or set up.     Team Discussion:  New eval therapy team feeling will be 2.5-3 weeks length of stay-goals not set at this time, will need to see if pt clears cognitively. Chest x-ray and UA done per MD for increased white count. Daughter here today to observe in therapies.  Revisions to Treatment Plan:  New eval      Elease Hashimoto 02/11/2017, 8:59 AM       Patient ID: Lydia Jordan, female   DOB: Feb 04, 1934, 83 y.o.   MRN: [id number]

## 2017-02-23 NOTE — Progress Notes (Signed)
Chaplain made a follow-up visit with this patient from previous status.  Family members asked for prayer.  Chaplain prayed with patient, daughter and granddaughter at bedside.  Patient woke up.  Nurses were called.  Nurse tech came and assisted the patient in suctioning.    Chaplain will remain available for this patient and family.    01/31/2017 2242  Clinical Encounter Type  Visited With Patient;Family  Visit Type Follow-up;Spiritual support;Social support  Referral From Chaplain  Consult/Referral To Chaplain  Spiritual Encounters  Spiritual Needs Prayer  Stress Factors  Patient Stress Factors Health changes  Family Stress Factors Health changes;Major life changes

## 2017-02-23 NOTE — Discharge Summary (Signed)
Physician Discharge Summary  Patient ID: BAKER KOGLER MRN: [id number] DOB/AGE: 04/28/1934 83 y.o.  Admit date: 02/21/2017 Discharge date: 02/07/2017  Discharge Diagnoses:  Principal Problem:   Cardiac arrest [facility]) Active Problems:   Physical deconditioning   Acute on chronic diastolic heart failure (HCC)   Leukocytosis   Debility   Comfort measures only status   Discharged Condition: actively dying.    Significant Diagnostic Studies: Dg Chest 2 View  Result Date: 02/22/2017 CLINICAL DATA:  Leukocytosis EXAM: CHEST  2 VIEW COMPARISON:  02/20/2017 FINDINGS: Prior valve replacement. Cardiomegaly. Small bilateral pleural effusions. Areas of perihilar and lower lobe atelectasis. No overt edema. IMPRESSION: Cardiomegaly with vascular congestion. Perihilar and lower lobe atelectasis with small effusions. Electronically Signed   By: Rolm Baptise M.D.   On: 02/22/2017 10:26   Dg Chest Port 1 View  Result Date: 02/02/2017 CLINICAL DATA:  Shortness of breath EXAM: PORTABLE CHEST 1 VIEW COMPARISON:  02/22/2017 FINDINGS: Postoperative changes in the mediastinum. Right PICC catheter with tip over the low SVC region. No pneumothorax. Cardiac enlargement with mild vascular congestion. Infiltration or atelectasis in the lung bases is increasing since previous study. Small bilateral pleural effusions. Calcified and ectatic aorta. Aortic shadow extends beyond the aortic calcifications, consistent with aortic dissection as was seen on previous CT 02/11/2017. Degenerative changes in the spine and shoulders. IMPRESSION: Cardiac enlargement, pulmonary vascular congestion, and increasing bilateral basilar infiltration or atelectasis. Small bilateral pleural effusions are unchanged. Calcified and ectatic aorta with soft tissue extending beyond the aortic calcification corresponding to known aortic dissection. Electronically Signed   By: Lucienne Capers M.D.   On: 02/13/2017 06:46   Dg Chest Port 1  View  Result Date: 02/20/2017 CLINICAL DATA:  83 year old female post aortic valve replacement. Subsequent encounter. EXAM: PORTABLE CHEST 1 VIEW COMPARISON:  02/19/2017. FINDINGS: Right PICC line tip projects at the level of the distal superior vena cava/ cavoatrial junction. Right internal jugular catheter has been removed. Post aortic valve replacement and left atrial appendage clipping. Cardiomegaly. Central pulmonary vascular congestion. Perihilar/basilar subsegmental atelectasis. There may be small pleural effusions. No pneumothorax. Calcified tortuous aorta. Bilateral prominent shoulder joint degenerative changes. IMPRESSION: Right internal jugular catheter has been removed. Right PICC catheter tip distal superior vena cava/ cavoatrial junction level. Cardiomegaly post aortic valve replacement and left atrial appendage clipping. Central pulmonary vascular prominence. Subsegmental atelectasis perihilar region lung bases. There may be small bilateral pleural effusions. Electronically Signed   By: Genia Del M.D.   On: 02/20/2017 07:36   Labs:  Basic Metabolic Panel:  Recent Labs Lab 02/18/17 0441 02/18/17 1527 02/19/17 0437 02/19/17 1559 02/20/17 0440 02/21/17 0453 02/22/17 0432 02/13/2017 0553  NA 137 134* 133* 134* 132* 133* 129* 126*  K 3.4* 4.3 3.2* 3.4* 3.3* 4.3 4.4 4.9  CL 100* 96* 97* 92* 95* 95* 91* 89*  CO2 29  --  30  --  30 32 30 30  GLUCOSE 96 132* 94 118* 102* 88 103* 125*  BUN 18 24* 17 19 14 17 16 19   CREATININE 0.81 0.70 0.75 0.70 0.60 0.62 0.64 0.68  CALCIUM 8.5*  --  8.2*  --  7.7* 8.2* 8.5* 8.4*    CBC:  Recent Labs Lab 02/21/17 0453 02/22/17 0432 02/23/17 1057  WBC 15.8* 18.9* 22.3*  NEUTROABS  --  14.7* 18.3*  HGB 10.5* 10.2* 9.4*  HCT 33.1* 32.3* 29.1*  MCV 91.9 91.2 90.4  PLT 276 365 384    CBG:  Recent Labs Lab  02/19/17 1626 02/19/17 2120 02/20/17 0833 02/20/17 2018 02/21/17 1134  GLUCAP 99 101* 81 142* 82    Brief HPI:   MAANVI LECOMPTE is a 83 y.o. female with history of A fib, recent NSTEMI, non-obstructive CAD, ascending aortic aneurysm, achalasia, DJD/DDD with chronic pain, multiple recent falls who presented to ED 02/11/17 with reports of recent fall with hip pain and inability to walk. X rays revealed right pubic rami fracture, chronic right UPJ obstruction with moderate to severe hydronephrosis as well as type A aortic dissection. She was evaluated by Dr. Roxy Manns and was cleared to undergo repair of aortic dissection with AVR, drainage of pericardial fluid and CABG X 1 on 02/15/17. to be WBAT per Dr. Fredonia Highland. Post op on milironone for fluid overload and on low dose Lovenox for A fib. She has had reports of dysphagia and MBS done functional swallow with high penetration of thin liquids occasionally and modified diet recommended by ST. She was advanced to dysphagia 3 per patient request. CXR reviewed 5/21, stable changes. Therapy ongoing and patient limited by SOB and fatigue. Patient with significant deficits in mobility and ability to carry out ADL tasks. CIR recommended for follow up therapy   Hospital Course: AVIKA CARBINE was admitted to rehab 02/21/2017 for inpatient therapies to consist of PT, ST and OT at least three hours five days a week. Past admission physiatrist, therapy team and rehab RN have worked together to provide customized collaborative inpatient rehab. She was noted to have increased WOB as well as increase in peripheral edema at admission. Follow up labs showed hyponatremia and she was placed on fluid restrictions. Cardiology was consulted for assistance and patient was started on aggressive diuresis for acute on chronic diastolic CHF.  UA/UCS was ordered for work up of leucocytosis. No signs of fever or other signs of infection noted but she continued to require supplemental oxygen per [place]. Therapy evaluations were done on 5/23 showing patient requiring max assist with ADL tasks and mod to max assist with  mobility. She exhibited severe cognitive impairments with word finding deficits and had poor awareness of deficits.   On early am of 5/24, she reported dyspnea with increase in restlessness and required NRB briefly. CXR done and showed increase in bibasilar atelectasis. She was treated with IV dose lasix 60 mg and respiratory status was stable and improved on 5 L oxygen. Fluid restriction was increased to 1000 cc due to progressive hyponatremia.  No reports of chest pain and she was able to participate in physical therapy and speech therapy. After her ST session, while being transferred by NT, patient became unresponsive and was found to be pulseless.  Code blue called and she  underwent  CPR X 5 minutes with 1 mg epi, intubated and was successfully resuscitated with return of pulse. Her daughter who was  discussed patient's situation with her family and requested her mother to be DNR and to be made comfort care.   Dr. Tana Coast was contacted to transfer patient to acute floor for management.  Patient was started on morphine drip, placed on NRB and transferred to regular floor.    Disposition: to acute floor.   Medication: per primary team.    Signed: Bary Leriche 03-02-2017, 8:23 AM

## 2017-02-23 NOTE — Progress Notes (Signed)
Physical Therapy Session Note  Patient Details  Name: Lydia Jordan MRN: [id number] Date of Birth: 09-13-1934  Today's Date: 02/12/2017 PT Individual Time: 1050-1130 PT Individual Time Calculation (min): 40 min  20 min missed   Short Term Goals: Week 1:  PT Short Term Goal 1 (Week 1): Pt will demo bed mobility with min assist PT Short Term Goal 2 (Week 1): Pt will ambulate 25' with LRAD and mod assist PT Short Term Goal 3 (Week 1): Pt will initiate stair training with PT PT Short Term Goal 4 (Week 1): Pt will demo sustained attention in quiet environment x5 minutes with min cues  Skilled Therapeutic Interventions/Progress Updates: Pt presented in bed lethargic with dgt present, with resp therapy and nsg jst leaving room. Pt participated in diaphragmatic and basal breathing x 10 with mod cues for nasal inhalation SpO2 between 86-100% with activity on 2L supplemental O2 via [place]. Performed neck flexion/ext for incision mobilization x 10 with cues for attention to task. Pt agreeable to sit at EOB for donning pants. Pt performed supine to sit with modA and max cues for sternal precautions. MaxA for initiation for wt shift with hips for scooting to EOB. Pt able to tolerate sitting at EOB x 5 min with cues for diaphragmatic breathing due to desat to 77% with recovery within 1 min. Pt modA sit to stand as dgt assisted with pulling up pants. Pt able to tolerate sitting approx 30 sec prior to request to sit in conjunction with desat. Pt returned to sitting and performed sit to supine with maxA. Total dependent for boosting to [facility]. Pt performed incentive spirometer x 5 avg 750-812m and encouraged dgt to have pt continue to perform hourly if able. Pt in bed at end of session with bed alarm on and dgt present with needs met.       Therapy Documentation Precautions:  Precautions Precautions: Fall, Sternal Restrictions Weight Bearing Restrictions: Yes RLE Weight Bearing: Weight bearing as  tolerated  See Function Navigator for Current Functional Status.   Therapy/Group: Individual Therapy  Eber Ferrufino  Kysen Wetherington, PTA  03/02/2017, 11:47 AM

## 2017-02-23 NOTE — Progress Notes (Addendum)
Progress Note  Patient Name: Lydia Jordan Date of Encounter: 02/17/2017  Primary Cardiologist: Nahser  Subjective   Breathing a little better now but earlier this AM had significant Trouble. No chest pain. Family at bedside.  Inpatient Medications    Scheduled Meds: . apixaban  2.5 mg Oral BID  . atorvastatin  20 mg Oral Daily  . bisacodyl  10 mg Oral Daily   Or  . bisacodyl  10 mg Rectal Daily  . carvedilol  3.125 mg Oral BID WC  . Chlorhexidine Gluconate Cloth  6 each Topical Q0600  . digoxin  0.125 mg Oral q morning - 10a  . docusate sodium  200 mg Oral Daily  . DULoxetine  30 mg Oral QHS  . DULoxetine  60 mg Oral Daily  . feeding supplement (ENSURE ENLIVE)  237 mL Oral TID WC  . folic acid-pyridoxine-cyancobalamin  1 tablet Oral Daily  . furosemide  60 mg Intravenous BID  . iron polysaccharides  150 mg Oral q12n4p  . levothyroxine  100 mcg Oral QAC breakfast  . lidocaine  1 patch Transdermal Q24H  . pantoprazole  80 mg Oral Daily  . potassium chloride  20 mEq Oral TID  . sodium chloride flush  10-40 mL Intracatheter Q12H   Continuous Infusions:  PRN Meds: acetaminophen, alum & mag hydroxide-simeth, bisacodyl, diphenhydrAMINE, guaiFENesin-dextromethorphan, ipratropium-albuterol, polyethylene glycol, prochlorperazine **OR** prochlorperazine **OR** prochlorperazine, sodium chloride flush, sodium phosphate, traZODone   Vital Signs    Vitals:   02/22/17 0831 02/22/17 1132 02/22/17 1523 03/01/2017 0542  BP:   135/80 111/69  Pulse:   (!) 106 79  Resp:   18 20  Temp:   98.1 F (36.7 C) 98.1 F (36.7 C)  TempSrc:   Oral Oral  SpO2: 98%  96% 90%  Weight:  149 lb 3.2 oz (67.7 kg)  149 lb 7.1 oz (67.8 kg)  Height:        Intake/Output Summary (Last 24 hours) at 02/21/2017 1032 Last data filed at 02/12/2017 0824  Gross per 24 hour  Intake              720 ml  Output              350 ml  Net              370 ml   Filed Weights   02/22/17 0501 02/22/17 1132  02/15/2017 0542  Weight: 158 lb 1.1 oz (71.7 kg) 149 lb 3.2 oz (67.7 kg) 149 lb 7.1 oz (67.8 kg)    Telemetry    Rate controlled atrial fibrillation - Personally Reviewed  ECG    No ischemic changes- Personally Reviewed  Physical Exam   GEN: Elderly, mildly ill-appearing, in bed, heart pillow over chest.   Neck: No JVD Cardiac:  irregularly irregular, no murmurs, rubs, or gallops. Wounds healing Respiratory: Clear to auscultation bilaterally anteriorly. No wheezing GI: Soft, nontender, non-distended  MS:  1+ pedal edema; No deformity. Neuro:  Nonfocal  Psych: Mildly confused previously  Labs    Chemistry Recent Labs Lab 02/18/17 0441  02/21/17 0453 02/22/17 0432 02/22/2017 0553  NA 137  < > 133* 129* 126*  K 3.4*  < > 4.3 4.4 4.9  CL 100*  < > 95* 91* 89*  CO2 29  < > 32 30 30  GLUCOSE 96  < > 88 103* 125*  BUN 18  < > 17 16 19   CREATININE 0.81  < > 0.62 0.64 0.68  CALCIUM 8.5*  < > 8.2* 8.5* 8.4*  PROT 5.7*  --  5.2* 5.7*  --   ALBUMIN 2.7*  --  2.3* 2.4*  --   AST 36  --  22 24  --   ALT 22  --  19 20  --   ALKPHOS 85  --  80 94  --   BILITOT 1.2  --  1.2 1.2  --   GFRNONAA >60  < > >60 >60 >60  GFRAA >60  < > >60 >60 >60  ANIONGAP 8  < > 6 8 7   < > = values in this interval not displayed.   Hematology Recent Labs Lab 02/20/17 0440 02/21/17 0453 02/22/17 0432  WBC 14.8* 15.8* 18.9*  RBC 3.46* 3.60* 3.54*  HGB 10.0* 10.5* 10.2*  HCT 31.1* 33.1* 32.3*  MCV 89.9 91.9 91.2  MCH 28.9 29.2 28.8  MCHC 32.2 31.7 31.6  RDW 14.4 14.6 14.5  PLT 232 276 365    Cardiac EnzymesNo results for input(s): TROPONINI in the last 168 hours. No results for input(s): TROPIPOC in the last 168 hours.   BNPNo results for input(s): BNP, PROBNP in the last 168 hours.   DDimer No results for input(s): DDIMER in the last 168 hours.   Radiology    Dg Chest 2 View  Result Date: 02/22/2017 CLINICAL DATA:  Leukocytosis EXAM: CHEST  2 VIEW COMPARISON:  02/20/2017 FINDINGS:  Prior valve replacement. Cardiomegaly. Small bilateral pleural effusions. Areas of perihilar and lower lobe atelectasis. No overt edema. IMPRESSION: Cardiomegaly with vascular congestion. Perihilar and lower lobe atelectasis with small effusions. Electronically Signed   By: Rolm Baptise M.D.   On: 02/22/2017 10:26   Dg Chest Port 1 View  Result Date: 02/20/2017 CLINICAL DATA:  Shortness of breath EXAM: PORTABLE CHEST 1 VIEW COMPARISON:  02/22/2017 FINDINGS: Postoperative changes in the mediastinum. Right PICC catheter with tip over the low SVC region. No pneumothorax. Cardiac enlargement with mild vascular congestion. Infiltration or atelectasis in the lung bases is increasing since previous study. Small bilateral pleural effusions. Calcified and ectatic aorta. Aortic shadow extends beyond the aortic calcifications, consistent with aortic dissection as was seen on previous CT 02/11/2017. Degenerative changes in the spine and shoulders. IMPRESSION: Cardiac enlargement, pulmonary vascular congestion, and increasing bilateral basilar infiltration or atelectasis. Small bilateral pleural effusions are unchanged. Calcified and ectatic aorta with soft tissue extending beyond the aortic calcification corresponding to known aortic dissection. Electronically Signed   By: Lucienne Capers M.D.   On: 02/20/2017 06:46    Cardiac Studies   Echocardiogram from 02/12/17 - Left ventricle: The cavity size was normal. Wall thickness was   increased increased in a pattern of mild to moderate LVH.   Systolic function was normal. The estimated ejection fraction was   in the range of 55% to 60%. - Aortic valve: Mildly calcified annulus. There was mild to   moderate regurgitation. - Aorta: There is a dissection flap with false lumen seen in the   ascending aorta with max dimension of 5.o cm at least. The aorta   was severely abnormal and moderately dilated. - Mitral valve: There was moderate regurgitation. - Left atrium:  The atrium was moderately to severely dilated. - Right atrium: The atrium was moderately dilated. - Tricuspid valve: There was mild-moderate regurgitation. - Pulmonary arteries: Systolic pressure was moderately increased.   PA peak pressure: 53 mm Hg (S). - Pericardium, extracardiac: A moderate to large pericardial   effusion was identified  circumferential to the heart. Features   were consistent with mild tamponade physiology.   Patient Profile     83 y.o. female with worsening respiratory distress, acute on chronic diastolic heart failure, increasing leukocytosis, by basilar atelectasis versus infiltrates with pulmonary vascular congestion on chest x-ray, increasing hypoxia with recent CABG 1 SVG to RCA, aortic root and valve replacement, left atrial appendage closure after significant aortic stenosis on 02/15/17   Assessment & Plan    Acute on chronic diastolic heart failure   - I will repeat echocardiogram to ensure that her left ventricle function has not deteriorated. Previously normal EF.  - This will also be helpful to make sure that her pericardial effusion has not worsened.  - Interestingly, postoperatively she did require milrinone and diuresis, possibly in support of her right ventricle.  - I will continue with IV Lasix. Fluid restrict 1 L. Encourage movement if possible, incentive spirometer.  - I'm concerned about increasing white count.  Atrial fibrillation  - Eliquis has been on digoxin for quite some time.  Hyponatremia  - Worse today. Question pulmonary process inducing SIADH  - Fluid restrict 1 L  - Continue with Lasix  CAD  - Single-vessel CABG. RCA.  Discussed with family.  Signed, Candee Furbish, MD  02/20/2017, 10:32 AM

## 2017-02-23 NOTE — Progress Notes (Signed)
Responded to code blue to support patient's daughter and staff. Patient is actively passing and is now comfort care. Daughter at bedside.  Other family members are aware and communicating with daughter.  Daughter asked that I call her fiance for additional support and he is in route to hospital. Passed on to on call chaplain for continued support.    02/13/2017 1643  Clinical Encounter Type  Visited With Patient and family together;Health care provider  Visit Type Initial;Spiritual support;Patient actively dying  Referral From Nurse  Consult/Referral To Chaplain  Stress Factors  Patient Stress Factors Major life changes  Family Stress Factors Exhausted;Loss of control;Major life changes  Jaclynn Major, [place]

## 2017-02-23 NOTE — Progress Notes (Signed)
Occupational Therapy Session Note  Patient Details  Name: Lydia Jordan MRN: [id number] Date of Birth: November 21, 1933  Today's Date: 02/04/2017 OT Individual Time: 1400-1500 OT Individual Time Calculation (min): 60 min    Short Term Goals: Week 1:  OT Short Term Goal 1 (Week 1): Pt will complete toilet transfer with mod A OT Short Term Goal 2 (Week 1): Pt will tolerate 3 mins standing in preparation for ADL tasks OT Short Term Goal 3 (Week 1): Pt will complete LB dressing sit<>stand with Mod A OT Short Term Goal 4 (Week 1): Pt will complete UB dressing with Mod A  Skilled Therapeutic Interventions/Progress Updates:    Pt seen for OT session focusing on functional activity tolerance and ADL re-training. Pt in supine upon arrival, family members present and pt on bed pan. While holding heart pillow, pt rolled with mod A for bedpan to be removed. She transferred to EOB with mod A. HR up to 170 and inconsistent pulse Ox readings of 70's-90's. Supplemental O2 raised from 2L to 3L. RN made aware, BP assessed 128/84. Following seated rest break with max cuing for deep breathing techniques. She completed modified stand for new brief to be donned. She then stood with mod A to place brief and following another seated rest break she stood to have pants pulled up. She transferred via stand pivot with max A lift/lower in order to transfer into w/c.  Following rest break, Pt completed grooming tasks from w/c level at sink with assist for set-up.  Pt left seated in w/c at end of session with family members present. Educated regarding continuing to cue for deep breathing techniques, importance of monitoring O2 stats and functional implications. RN made aware of pt's position at end of session and pt's inconsistent pulse ox readings.   Therapy Documentation Precautions:  Precautions Precautions: Fall, Sternal Restrictions Weight Bearing Restrictions: Yes RLE Weight Bearing: Weight bearing as  tolerated ADL: ADL ADL Comments: Please see functional navigator  See Function Navigator for Current Functional Status.   Therapy/Group: Individual Therapy  Lewis, Obinna Ehresman C 02/23/2017, 6:52 AM

## 2017-02-23 NOTE — Progress Notes (Signed)
Social Work Assessment and Plan Social Work Assessment and Plan  Patient Details  Name: Lydia Jordan MRN: [id number] Date of Birth: 05-14-1934  Today's Date: 02/11/2017  Problem List:  Patient Active Problem List   Diagnosis Date Noted  . Debility 02/21/2017  . Acute on chronic diastolic heart failure ([place])   . Chronic diastolic congestive heart failure ([place])   . S/P AVR   . Dysphagia   . Tachypnea   . Hypokalemia   . Leukocytosis   . S/P ascending aortic dissection repair 02/15/2017  . 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 S/P CABG x 1 02/15/2017  . Pubic ramus fracture, right, closed, initial encounter ([place]) 02/12/2017  . Chronic pain   . Cervical disc disease   . Degenerative arthritis   . Physical deconditioning   . Fall   . Closed fracture of multiple pubic rami ([place])   . Anemia   . Hydronephrosis of right kidney   . Ascending aortic dissection ([place]) 02/11/2017  . Pericardial effusion 02/11/2017  . S/P cardiac cath: (2018) a. mild to moderate disease but no flow limiting lesions with perserved EF. b. large-caliber almost ectatic vessels for patients age. 01/12/2017  . Hypertension 01/12/2017  . CAD (coronary artery disease): mild to moderate non-obstructive 01/12/2017  . Epistaxis: (2018) cauterized by Dr. Erik Obey 37/07/6268  . Longstanding persistent atrial fibrillation ([place])   . Ascending aortic aneurysm ([place]) 01/11/2017  . NSTEMI (non-ST elevated myocardial infarction) ([place]) 01/10/2017   Past Medical History:  Past Medical History:  Diagnosis Date  . Acid reflux   . Arthritis   . Ascending aortic aneurysm ([place]) 01/11/2017  . Ascending aortic dissection ([place]) 02/11/2017  . CAD (coronary artery disease)    Cath 01/11/17 showed 50% ost RCA and 50% mLAD --> medical therapy   . Cervical disc disease   . Chronic diastolic congestive heart failure ([place])   . Chronic pain    Secondary to degenerative disc disease and  arthritis  . Degenerative arthritis   . Hydronephrosis of right kidney   . Hypertension   . Hypothyroidism   . Hypothyroidism   . Longstanding persistent atrial fibrillation ([place])    originally diagnosed 2007  . NSTEMI (non-ST elevated myocardial infarction) ([place]) 01/10/2017  . Pubic ramus fracture, right, closed, initial encounter ([place]) 02/12/2017  . S/P ascending aortic dissection repair 02/15/2017   Straight graft replacement of sub-acute ascending aortic dissection with hemi-arch distal aortic reconstruction  . 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 AORTIC VALVE REPLACEMENT N/A 02/15/2017   Procedure: AORTIC VALVE REPLACEMENT (AVR);  Surgeon: [name], MD;  Location: [facility];  Service: Open Heart Surgery;  Laterality: N/A;  . BENTALL PROCEDURE N/A 02/15/2017   Procedure: BENTALL AORTIC ROOT REPLACEMENT PROCEDURE, REPAIR ASCENDING AORTIC DISSECTION;  Surgeon: [name], MD;  Location: [facility];  Service: Open Heart Surgery;  Laterality: N/A;  . CLIPPING OF ATRIAL APPENDAGE Left 02/15/2017   Procedure: CLIPPING OF ATRIAL APPENDAGE;  Surgeon: [name], MD;  Location: [facility];  Service: Open Heart Surgery;  Laterality: Left;  . CORONARY ARTERY BYPASS GRAFT N/A 02/15/2017   Procedure: CORONARY ARTERY BYPASS GRAFTING (CABG) x  one, using left saphenous vein;  Surgeon: [name], MD;  Location: [facility];  Service: Vascular;  Laterality: N/A;  . ESOPHAGEAL MANOMETRY N/A 07/31/2015   Procedure: ESOPHAGEAL MANOMETRY (EM);  Surgeon: Arta Silence, MD;  Location: [facility];  Service: Endoscopy;  Laterality: N/A;  . ESOPHAGOGASTRODUODENOSCOPY (EGD) WITH PROPOFOL N/A  09/02/2015   Procedure: ESOPHAGOGASTRODUODENOSCOPY (EGD) WITH PROPOFOL;  Surgeon: Arta Silence, MD;  Location: [facility];  Service: Endoscopy;  Laterality: N/A;  botulinum toxin injection (100 Units; 25 units in 4 quadrants 1-2 cm proximal to GE junction  . LEFT HEART CATH AND CORONARY ANGIOGRAPHY N/A 01/11/2017   Procedure: Left Heart Cath and Coronary Angiography;  Surgeon: Leonie Man, MD;  Location: [facility];  Service: Cardiovascular;  Laterality: N/A;  . PERICARDIAL FLUID DRAINAGE  02/15/2017   Procedure: DRAINAGE OF PERICARDIAL FLUID;  Surgeon: [name], MD;  Location: [facility];  Service: Vascular;;  . skin cancer area removed      right leg healing well  . TEE WITHOUT CARDIOVERSION N/A 02/15/2017   Procedure: TRANSESOPHAGEAL ECHOCARDIOGRAM (TEE);  Surgeon: [name], MD;  Location: [facility];  Service: Open Heart Surgery;  Laterality: N/A;  . VAGINAL HYSTERECTOMY     Social History:  reports that she has quit smoking. She has never used smokeless tobacco. She reports that she does not drink alcohol or use drugs.  Family / Support Systems Marital Status: Divorced Patient Roles: Parent, Other (Comment) (sibling) Children: Beatriz Stallion [phone] Other Supports: sister stayed with her here while daughter was out of town Anticipated Caregiver: Otila Kluver and pt's sister Ability/Limitations of Caregiver: Otila Kluver works from home but does need to leave for appointments Caregiver Availability: Other (Comment) (Awaiting goals and will work on a plan) Family Dynamics: Close knit with daughter and sister. She has friends and church members who are supportive and visit. Daughter reports she is an only child and will do all she can for her.  Social History Preferred language: English Religion: Methodist Cultural Background: No issues Education: [employer] Read: Yes Write: Yes Employment Status: Retired Freight forwarder Issues: No issues Guardian/Conservator:  none-accoridng to MD pt is not fully capable of making her own decisions while here. Will look toward her daughter if any decisions need to be made while here, since she is next of kin   Abuse/Neglect Physical Abuse: Denies Verbal Abuse: Denies Sexual Abuse: Denies Exploitation of patient/patient's resources: Denies Self-Neglect: Denies  Emotional Status Pt's affect, behavior adn adjustment status: Pt is motivated but needing to get more awake. MD is checking for UA and chest x-ray due to more SOB today. She has alwasy been independent and wants to get back to this level. Her daughter reports she was doing most for herself except cleaning and Best boy. Recent Psychosocial Issues: recent health issues and falls-fell outside MD office and hit her head, just prior to admission here Pyschiatric History: No issues deferred depression screen due to too lethargic and not able to complete assessment. Will re-assess pt at a later time. Daughter feels she was more clear yesterday and is different today-MD aware. Substance Abuse History: No issues  Patient / Family Perceptions, Expectations & Goals Pt/Family understanding of illness & functional limitations: Pt and daughter have spoken with the MD and feel they have a good understanding of her condition and surgery. Daughter is concerned about how lethargic she is today and less clear. MD is doing tests for this and will inform daughter of the results.  Premorbid pt/family roles/activities: Mom, retiree, sibling, church member, etc Anticipated changes in roles/activities/participation: resume Pt/family expectations/goals: Pt states: " I want to do well here."  Daughter states: " I want her to get back to where she was before this, she did well at home."  [employer]: Other (Comment) (Wound Clinic-for toes) Premorbid Home Care/DME Agencies: None Transportation available at discharge: family Resource referrals  recommended: Neuropsychology, Support group (specify)  Discharge Planning Living Arrangements: Children Support Systems: Children, Other relatives, Friends/neighbors, Social worker community Type of Residence: Private residence Insurance Resources: [employer], Multimedia programmer (specify) Web designer) Financial Resources: Radio broadcast assistant Screen Referred: No Living Expenses: Lives with family Money Management: Patient Does the patient have any problems obtaining your medications?: No Home Management: Daughter has hired a Scientist, product/process development, but pt prepared meals and did her own Training and development officer Plans: Return home in daughter's in-law suite. Daughter's home is connected to pt's by sunroom. Pt was mod/i prior to admission. Daughter does work from home but needs to leave at times for appointments. Made aware pt may need 24 hr care upon discharge from here, unless clears and is more cognitively with it. Social Work Anticipated Follow Up Needs: HH/OP, Support Group  Clinical Impression Pleasantly confused patient who is moving constantly in bed, quite restless. Daughter reports did not do this prior to admission. Pt seems to have a BI from her multiple falls. MD looking into this. Daughter made aware her Mom may require 24 hr care at discharge. Will work with daughter on a safe discharge plan and options if not able to return home. Would benefit from seeing neuro-psych while here. Will allow her to adjust to the unit and see if she clears with tests and MD follow.  Elease Hashimoto 02/13/2017, 9:25 AM

## 2017-02-23 NOTE — Progress Notes (Signed)
CNA called nurse to room at approximately 1609, pt noted to be unresponsive to stimuli, O2 via n/c at 2L, pt was noted to be SOB with shallow breathing. Pt was ash color, pt nail bed cyanotic,  pt in bed sitting at 50 degree angle with daughter at bedside. Called rapid response ,no pulse noted called code team, Daughter at bedside informed nurse that pt didn't want CPR. Chart pulled with full code info,  CPR started.. Rapid response on scene, CPR started at 1612, no pulse. Code team on scene. MD confirmed with daughter DNR at 65. CPR stopped, Pt with b/p and pulse at time CPR stopped. Comfort measures in place, see code sheet.

## 2017-02-23 NOTE — Progress Notes (Signed)
Chaplain received this case from Staff Chaplain.  Chaplain introduced herself to daughter and other family members.  Family very receptive of Chaplain support.  Dr. Ricard Dillon, daughter, sister and others at bedside.  Palliative MD arrived to talk about moving patient to 6N for further comfort care.  Chaplain notified family that she would be around all evening for support.  Chaplain would like to thank all nursing staff and MD's for care given to this patient and her family.    02/01/2017 1700  Clinical Encounter Type  Visited With Patient and family together  Visit Type Follow-up;Spiritual support;Social support;Patient actively dying  Referral From Chaplain  Consult/Referral To Chaplain  Spiritual Encounters  Spiritual Needs Emotional;Grief support  Stress Factors  Patient Stress Factors Health changes;Major life changes  Family Stress Factors Health changes;Major life changes   Kashonda Sarkisyan Hassell Done

## 2017-02-23 NOTE — H&P (Deleted)
  The note originally documented on this encounter has been moved the the encounter in which it belongs.  

## 2017-02-23 NOTE — Progress Notes (Signed)
  Pt finished working with SLP, when pt became non responsive.  Code was called and code team noted PEA, received EPI, regained BP and pulse but remained non responsive, Family requested no further code attempts.  Pt family at bedside, notified her surgeon Dr Ricard Dillon who plans to come up.  Notified [facility] hospitalist and requested admit for comfort care measures.  Tried calling palliative on call but no answer.

## 2017-02-23 NOTE — Progress Notes (Signed)
Late note: contacted around 6 am about patient's SOB as well as chest pain due to discomfort from increase in coughing. Patient required NRB to help keep saturations up. SOB question due to worsening of CHF---Asked nurse to have RR nurse come to evaluate as well as stat CXR and ABG ordered for work up.

## 2017-02-23 NOTE — Code Documentation (Signed)
CODE BLUE NOTE  Patient Name: Lydia Jordan   MRN: [id number]   Date of Birth/ Sex: [birth year] , female      Admission Date: 02/21/2017  Attending Provider: Charlett Blake, MD  Primary Diagnosis: Physical deconditioning    Indication: Pt was in her usual state of health until this PM, when she was noted to be in PEA. Code blue was subsequently called. At the time of arrival on scene, ACLS protocol was underway.    Technical Description:  - CPR performance duration:  5 minutes  - Was defibrillation or cardioversion used? No   - Was external pacer placed? No  - Was patient intubated pre/post CPR? Yes    Medications Administered: Y = Yes; Blank = No Amiodarone    Atropine    Calcium    Epinephrine  1  Lidocaine    Magnesium    Norepinephrine    Phenylephrine    Sodium bicarbonate    Vasopressin      Post CPR evaluation:  - Final Status - Was patient successfully resuscitated ? Yes - What is current rhythm? NSR - What is current hemodynamic status? Stable however with faint pulses.  Daughter stepped into room at this time and states she wishes for her mother to be DNR and be comfortable if she were to code again.     Miscellaneous Information:  - Labs sent, including: None  - Primary team notified?  Yes  - Family Notified? Yes  - Additional notes/ transfer status: None        Lovenia Kim, MD  02/26/2017, 4:28 PM

## 2017-02-23 NOTE — Progress Notes (Addendum)
Subjective/Complaints: Desaturation, placed on non rebreather ABG showed O2 sat of 99%,PCO2 was 35  , now back on [place] at 5L  ROS-  No CP/SOB, NVD  Objective: Vital Signs: Blood pressure 111/69, pulse 79, temperature 98.1 F (36.7 C), temperature source Oral, resp. rate 20, height _0  (1.6 m), weight 67.8 kg (149 lb 7.1 oz), SpO2 90 %. Dg Chest 2 View  Result Date: 02/22/2017 CLINICAL DATA:  Leukocytosis EXAM: CHEST  2 VIEW COMPARISON:  02/20/2017 FINDINGS: Prior valve replacement. Cardiomegaly. Small bilateral pleural effusions. Areas of perihilar and lower lobe atelectasis. No overt edema. IMPRESSION: Cardiomegaly with vascular congestion. Perihilar and lower lobe atelectasis with small effusions. Electronically Signed   By: Rolm Baptise M.D.   On: 02/22/2017 10:26   Dg Chest Port 1 View  Result Date: 03/01/2017 CLINICAL DATA:  Shortness of breath EXAM: PORTABLE CHEST 1 VIEW COMPARISON:  02/22/2017 FINDINGS: Postoperative changes in the mediastinum. Right PICC catheter with tip over the low SVC region. No pneumothorax. Cardiac enlargement with mild vascular congestion. Infiltration or atelectasis in the lung bases is increasing since previous study. Small bilateral pleural effusions. Calcified and ectatic aorta. Aortic shadow extends beyond the aortic calcifications, consistent with aortic dissection as was seen on previous CT 02/11/2017. Degenerative changes in the spine and shoulders. IMPRESSION: Cardiac enlargement, pulmonary vascular congestion, and increasing bilateral basilar infiltration or atelectasis. Small bilateral pleural effusions are unchanged. Calcified and ectatic aorta with soft tissue extending beyond the aortic calcification corresponding to known aortic dissection. Electronically Signed   By: Lucienne Capers M.D.   On: 02/18/2017 06:46   Results for orders placed or performed during the hospital encounter of 02/21/17 (from the past 72 hour(s))  CBC WITH DIFFERENTIAL      Status: Abnormal   Collection Time: 02/22/17  4:32 AM  Result Value Ref Range   WBC 18.9 (H) 4.0 - 10.5 K/uL   RBC 3.54 (L) 3.87 - 5.11 MIL/uL   Hemoglobin 10.2 (L) 12.0 - 15.0 g/dL   HCT 32.3 (L) 36.0 - 46.0 %   MCV 91.2 78.0 - 100.0 fL   MCH 28.8 26.0 - 34.0 pg   MCHC 31.6 30.0 - 36.0 g/dL   RDW 14.5 11.5 - 15.5 %   Platelets 365 150 - 400 K/uL   Neutrophils Relative % 77 %   Neutro Abs 14.7 (H) 1.7 - 7.7 K/uL   Lymphocytes Relative 8 %   Lymphs Abs 1.4 0.7 - 4.0 K/uL   Monocytes Relative 12 %   Monocytes Absolute 2.3 (H) 0.1 - 1.0 K/uL   Eosinophils Relative 3 %   Eosinophils Absolute 0.5 0.0 - 0.7 K/uL   Basophils Relative 0 %   Basophils Absolute 0.0 0.0 - 0.1 K/uL  Comprehensive metabolic panel     Status: Abnormal   Collection Time: 02/22/17  4:32 AM  Result Value Ref Range   Sodium 129 (L) 135 - 145 mmol/L   Potassium 4.4 3.5 - 5.1 mmol/L   Chloride 91 (L) 101 - 111 mmol/L   CO2 30 22 - 32 mmol/L   Glucose, Bld 103 (H) 65 - 99 mg/dL   BUN 16 6 - 20 mg/dL   Creatinine, Ser 0.64 0.44 - 1.00 mg/dL   Calcium 8.5 (L) 8.9 - 10.3 mg/dL   Total Protein 5.7 (L) 6.5 - 8.1 g/dL   Albumin 2.4 (L) 3.5 - 5.0 g/dL   AST 24 15 - 41 U/L   ALT 20 14 - 54 U/L  Alkaline Phosphatase 94 38 - 126 U/L   Total Bilirubin 1.2 0.3 - 1.2 mg/dL   GFR calc non Af Amer >60 >60 mL/min   GFR calc Af Amer >60 >60 mL/min    Comment: (NOTE) The eGFR has been calculated using the CKD EPI equation. This calculation has not been validated in all clinical situations. eGFR's persistently <60 mL/min signify possible Chronic Kidney Disease.    Anion gap 8 5 - 15  Urinalysis, Routine w reflex microscopic     Status: None   Collection Time: 02/22/17  7:17 AM  Result Value Ref Range   Color, Urine YELLOW YELLOW   APPearance CLEAR CLEAR   Specific Gravity, Urine 1.015 1.005 - 1.030   pH 8.0 5.0 - 8.0   Glucose, UA NEGATIVE NEGATIVE mg/dL   Hgb urine dipstick NEGATIVE NEGATIVE   Bilirubin Urine  NEGATIVE NEGATIVE   Ketones, ur NEGATIVE NEGATIVE mg/dL   Protein, ur NEGATIVE NEGATIVE mg/dL   Nitrite NEGATIVE NEGATIVE   Leukocytes, UA NEGATIVE NEGATIVE  Basic metabolic panel     Status: Abnormal   Collection Time: 02/06/2017  5:53 AM  Result Value Ref Range   Sodium 126 (L) 135 - 145 mmol/L   Potassium 4.9 3.5 - 5.1 mmol/L   Chloride 89 (L) 101 - 111 mmol/L   CO2 30 22 - 32 mmol/L   Glucose, Bld 125 (H) 65 - 99 mg/dL   BUN 19 6 - 20 mg/dL   Creatinine, Ser 0.68 0.44 - 1.00 mg/dL   Calcium 8.4 (L) 8.9 - 10.3 mg/dL   GFR calc non Af Amer >60 >60 mL/min   GFR calc Af Amer >60 >60 mL/min    Comment: (NOTE) The eGFR has been calculated using the CKD EPI equation. This calculation has not been validated in all clinical situations. eGFR's persistently <60 mL/min signify possible Chronic Kidney Disease.    Anion gap 7 5 - 15  Blood gas, arterial     Status: Abnormal   Collection Time: 02/15/2017  6:23 AM  Result Value Ref Range   FIO2 100.00    Mode NON-REBREATHER OXYGEN MASK    pH, Arterial 7.496 (H) 7.350 - 7.450   pCO2 arterial 35.8 32.0 - 48.0 mmHg   pO2, Arterial 285 (H) 83.0 - 108.0 mmHg   Bicarbonate 27.4 20.0 - 28.0 mmol/L   Acid-Base Excess 4.1 (H) 0.0 - 2.0 mmol/L   O2 Saturation 99.8 %   Patient temperature 98.6    Collection site LEFT RADIAL    Drawn by [id number]    Sample type LEFT RADIAL    Allens test (pass/fail) PASS PASS     HEENT: RIght facial bruising Cardio: RRR and no murmur Resp: CTA B/L and unlabored GI: BS positive and NT, ND Extremity:  Edema trace pretib Skin:   Other Sternotomy incision with bruising but healing well, R subclavicular ecchymosis no drainage, L medial thigh with drain and hand grenade Neuro: Alert/Oriented, Flat and Abnormal Motor 4- in BUE and BLE Musc/Skel:  Other no severe pain with LE movement Gen - frail appearing but NAD   Assessment/Plan: 1. Functional deficits secondary to debility s/p AVR/ascending aortic, pelvic  fracture which require 3+ hours per day of interdisciplinary therapy in a comprehensive inpatient rehab setting. Physiatrist is providing close team supervision and 24 hour management of active medical problems listed below. Physiatrist and rehab team continue to assess barriers to discharge/monitor patient progress toward functional and medical goals. FIM: Function - Bathing Position: Wheelchair/chair at  sink Body parts bathed by patient: Right arm, Left arm Body parts bathed by helper: Front perineal area, Buttocks, Right lower leg, Left lower leg, Back, Right upper leg, Left upper leg Bathing not applicable: Chest, Abdomen (2/2 incisions) Assist Level: Touching or steadying assistance(Pt > 75%)  Function- Upper Body Dressing/Undressing What is the patient wearing?: Pull over shirt/dress Pull over shirt/dress - Perfomed by helper: Thread/unthread right sleeve, Thread/unthread left sleeve, Put head through opening, Pull shirt over trunk Assist Level: Touching or steadying assistance(Pt > 75%) Function - Lower Body Dressing/Undressing What is the patient wearing?: Pants, Non-skid slipper socks Position: Wheelchair/chair at sink Pants- Performed by helper: Thread/unthread left pants leg, Thread/unthread right pants leg, Pull pants up/down Non-skid slipper socks- Performed by helper: Don/doff right sock, Don/doff left sock Assist for footwear: Maximal assist Assist for lower body dressing: Touching or steadying assistance (Pt > 75%)  Function - Toileting Toileting activity did not occur: No continent bowel/bladder event Toileting steps completed by patient: Adjust clothing prior to toileting, Adjust clothing after toileting Toileting steps completed by helper: Adjust clothing prior to toileting, Performs perineal hygiene, Adjust clothing after toileting Assist level: Touching or steadying assistance (Pt.75%)  Function - Air cabin crew transfer assistive device: Grab bar Assist  level to toilet: Maximal assist (Pt 25 - 49%/lift and lower) Assist level from toilet: Maximal assist (Pt 25 - 49%/lift and lower)  Function - Chair/bed transfer Chair/bed transfer method: Stand pivot Chair/bed transfer assist level: Maximal assist (Pt 25 - 49%/lift and lower)  Function - Locomotion: Wheelchair Will patient use wheelchair at discharge?: No Function - Locomotion: Ambulation Assistive device: Hand held assist Max distance: 15 Assist level: Maximal assist (Pt 25 - 49%) Assist level: Maximal assist (Pt 25 - 49%) Walk 50 feet with 2 turns activity did not occur: Safety/medical concerns Walk 150 feet activity did not occur: Safety/medical concerns Walk 10 feet on uneven surfaces activity did not occur: Safety/medical concerns  Function - Comprehension Comprehension: Auditory Comprehension assist level: Understands basic less than 25% of the time/ requires cueing >75% of the time  Function - Expression Expression: Verbal Expression assist level: Expresses basic 25 - 49% of the time/requires cueing 50 - 75% of the time. Uses single words/gestures.  Function - Social Interaction Social Interaction assist level: Interacts appropriately 90% of the time - Needs monitoring or encouragement for participation or interaction.  Function - Problem Solving Problem solving assist level: Solves basic less than 25% of the time - needs direction nearly all the time or does not effectively solve problems and may need a restraint for safety  Function - Memory Memory assist level: Recognizes or recalls 25 - 49% of the time/requires cueing 50 - 75% of the time Patient normally able to recall (first 3 days only): Current season, That he or she is in a hospital  Medical Problem List and Plan: 1.  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             -CIR PT, OT,reduce to 15/7 schedule 2.  DVT  Prophylaxis/Anticoagulation: Pharmaceutical: Other (comment)--to resume eliquis today.  3. Chronic pain/Pain Management: Continue cymbalta bid. Will discontinue tramadol due as it likely contributing to confusion. Neck pain - Kpad, may use [place] from home 4. Mood: LCSW to follow for evaluation and support.  5. Neuropsych: This patient is not fully capable of making decisions on her own behalf.             -  limit neurosedating medication as possible             -recheck UA neg             -pt with recent head trauma (CT negative for acute injury but with substantial atrophy) 6. Skin/Wound Care: Monitor wound daily for healing. Routine pressure relief measures.  7. Fluids/Electrolytes/Nutrition: Monitor I/O. Check lytes in am. Add protein supplement.  8. A fib: Monitor HR bid. Continue ASA, coreg and lanoxin for heart rate control.  10. HTN: Monitor BP bid--poorly controlled at this time. May need to add 12. Congestive heart failure: Monitor weight daily--on upward trend. Continue IV lasix. Add fluid restriction given hyponatremia and  13. Hypoxia: Encourage IS. Wean oxygen as tolerated.  14. Right pelvic fracture: WBAT 15. Achalasia/Dysphagia: Full supervision with modified diet for safety. 16. ABLA: Monitor serially.Hgb 10.2  Continue iron supplement. Recent epistaxis:  17. Persistent leucocytosis: Monitor for signs of infections.  10. Right hydronephrosis: ? Chronic. Renal status stable.  11.  Hypokalemia-KCL supplement repeat on Friday !2.  Hyponatremia- fluid restrict- also receiving Lasix 13.  Hypoalbuminemia prostat LOS (Days) 2 A FACE TO FACE EVALUATION WAS PERFORMED  Usiel Astarita E 02/17/2017, 7:49 AM

## 2017-02-23 NOTE — Progress Notes (Signed)
Patient with audible wheezing  And congestion  To bilateral upper lobes this AM. Patient stated, "I can't breath!" O2 Sats 90% with 2L O2. Patient given breathing treatment.

## 2017-02-23 NOTE — Progress Notes (Signed)
Speech Language Pathology Daily Session Note  Patient Details  Name: ROCQUEL ASKREN MRN: [id number] Date of Birth: 1934/05/17  Today's Date: 03/02/2017 SLP Individual Time: 1535-1600 SLP Individual Time Calculation (min): 25 min  Short Term Goals: Week 1: SLP Short Term Goal 1 (Week 1): Patient will demonstrate sustained attention to functional task for ~2 minutes with Max A multimodal cues.  SLP Short Term Goal 2 (Week 1): Patient will initiate verbal responses to 50% of questions with Max A multimodal cues.  SLP Short Term Goal 3 (Week 1): Patient will follow 1 step commands in 50% of opportunities with Max A multimodal cues.  SLP Short Term Goal 4 (Week 1): Patient will orient to place, time and situation with Max A multimodal cues.  SLP Short Term Goal 5 (Week 1): Patient will conusme current diet with minimal overt s/s of aspiration with Mod A verbal cues for use of swallowing compensatory strategies.   Skilled Therapeutic Interventions:  Pt was seen for skilled ST targeting dysphagia goals.  Pt was sitting up in wheelchair upon arrival, awake, lethargic and agreeable to participating in [place].  Pt could not recall events from previous ST therapy session despite max assist verbal cues.  Pt consumed 1 bite of graham cracker and 1 sip of cranberry juice with delayed throat clearing.  Pt with complaints of mouth pain and therapist noted a small area of ulceration on posterior aspect of hard palate, advised removing dentures at night and thoroughly cleaning them.  Pt requested to go back to bed and SLP assisted pt back to bed with assistance from nurse tech.  Pt reported shortness of breath once back in bed, unable to obtain O2 sats via [place] or continuous pulse ox.  Informed RN.  Instructed pt on deep breathing techniques which appeared to improve pt comfort temporarily.  Pt was left resting in bed in the care of nurse tech at the end of today's therapy session.  Of note, shortly after  therapist's departure pt became unresponsive and pulseless.  Code blue was called, CPR was initiated until family's wishes for no further resuscitations efforts were verified.   Per report pt actively dying and is now comfort care.  No further ST warranted at this time.   Function:  Eating Eating   Modified Consistency Diet: Yes Eating Assist Level: Supervision or verbal cues           Cognition Comprehension Comprehension assist level: Understands basic 50 - 74% of the time/ requires cueing 25 - 49% of the time  Expression   Expression assist level: Expresses basic 50 - 74% of the time/requires cueing 25 - 49% of the time. Needs to repeat parts of sentences.  Social Interaction Social Interaction assist level: Interacts appropriately 90% of the time - Needs monitoring or encouragement for participation or interaction.  Problem Solving Problem solving assist level: Solves basic 25 - 49% of the time - needs direction more than half the time to initiate, plan or complete simple activities  Memory Memory assist level: Recognizes or recalls 25 - 49% of the time/requires cueing 50 - 75% of the time    Pain Pain Assessment Pain Assessment: No/denies pain  Therapy/Group: Individual Therapy  Kenora Spayd, Selinda Orion 02/14/2017, 9:54 PM

## 2017-02-23 NOTE — Progress Notes (Signed)
RN called for O2 sats dropping to 75% on 2L associated with audible wheezing, breathing treatment given PTA. Pam PA paged PTA ordered ABG 7.49/35.8/285/27.4 and CXR resulting in increased bilateral basilar infiltration or atelectasis.  On arrival pt supine in bed, NRB placed by RT, cool and diaphoretic, mild expiratory wheezing noted ot Right upper airway, bilateral lower diminished breath sounds. Placed pt on 5L [place] sats remaining 98-100%. Ordered continuous pulse ox and pediatric pulse ox in hopes of a better read on SpO2. Currently poor wave form. Pt a/o x4, endorses pain with deep breathing. Pt complaining of feeling anxious lying in bed, moved to recliner, pt states she feels better in recliner and is breathing easier.

## 2017-02-23 NOTE — H&P (Signed)
History and Physical        Hospital Admission Note Date: 02/16/2017  Patient name: Lydia Jordan Medical record number: [id number] Date of birth: 12-14-1933 Age: 83 y.o. Gender: female  PCP: Lavone Orn, MD    Patient coming from: inpatient rehab   Chief Complaint:  Comfort care after code blue today   HPI:  Patient is a 83 year old female with hypertension, persistently deformation on antiplatelet medication, degenerative arthritis, cervical disc disease, chronic pain syndrome who was recently discharged to inpatient rehabilitation on 02/21/17 by cardiothoracic surgery after repair of ascending thoracic aortic dissection. Patient had recent NSTEMI in 4/18, patient had echo and cardiac cath at this time and no obstructive lesion was found and patient was recommended medical therapy. Patient was admitted again on 5/13 to the cardiology service when she presented with a mechanical fall, hip pain and inability to walk. During the workup, she was found to have type A aortic dissection on the CT chest. Cardiothoracic surgery was consulted. She was evaluated by Dr Roxy Manns and was cleared to undergo repair of aortic dissection with AVR, drainage of pericardial fluid, CABG x1 on 02/15/17. CIR was recommended and patient was discharged to inpatient rehabilitation on 02/21/17. Cardiology was consulted on 5/23 as patient was noted to be hypoxic with lower extremity edema, acute diastolic CHF and Lasix was increased to 60 mg IV BID. Patient was noted to be somewhat confused and was attributed to hyponatremia. On 5/20 4 AM, patient had a rapid response with hypoxia and audible wheezing. Chest x-ray showed increasing bilateral basilar infiltrates, received nebulizer treatment, placed on NRB mask. Repeat echocardiogram was ordered. However, later around 4 PM, patient was noted to be in PEA and CODE  BLUE was called. Patient was successfully resuscitated, however family/daughter requested patient to be DNR, DNI and comfort care at this time. They do not want her suffering to be prolonged anymore.    Review of Systems: Positives marked in 'bold' Unable to assess from the patient due to her mental status  Past Medical History: Past Medical History:  Diagnosis Date  . Acid reflux   . Arthritis   . Ascending aortic aneurysm ([place]) 01/11/2017  . Ascending aortic dissection ([place]) 02/11/2017  . CAD (coronary artery disease)    Cath 01/11/17 showed 50% ost RCA and 50% mLAD --> medical therapy   . Cervical disc disease   . Chronic diastolic congestive heart failure ([place])   . Chronic pain    Secondary to degenerative disc disease and arthritis  . Degenerative arthritis   . Hydronephrosis of right kidney   . Hypertension   . Hypothyroidism   . Hypothyroidism   . Longstanding persistent atrial fibrillation ([place])    originally diagnosed 2007  . NSTEMI (non-ST elevated myocardial infarction) ([place]) 01/10/2017  . Pubic ramus fracture, right, closed, initial encounter ([place]) 02/12/2017  . S/P ascending aortic dissection repair 02/15/2017   Straight graft replacement of sub-acute ascending aortic dissection with hemi-arch distal aortic reconstruction  . 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 AORTIC VALVE REPLACEMENT N/A 02/15/2017   Procedure: AORTIC VALVE REPLACEMENT (AVR);  Surgeon: [name], MD;  Location: [place];  Service: Open Heart Surgery;  Laterality: N/A;  . BENTALL PROCEDURE N/A 02/15/2017   Procedure: BENTALL AORTIC ROOT REPLACEMENT PROCEDURE, REPAIR ASCENDING AORTIC DISSECTION;  Surgeon: [name], MD;  Location: [place];  Service: Open Heart Surgery;  Laterality: N/A;  . CLIPPING OF ATRIAL APPENDAGE Left 02/15/2017   Procedure: CLIPPING OF ATRIAL APPENDAGE;  Surgeon: [name], MD;  Location: [facility];  Service: Open Heart Surgery;  Laterality: Left;  . CORONARY ARTERY BYPASS GRAFT N/A 02/15/2017   Procedure: CORONARY ARTERY BYPASS GRAFTING (CABG) x  one, using left saphenous vein;  Surgeon: [name], MD;  Location: [facility] OR;  Service: Vascular;  Laterality: N/A;  . ESOPHAGEAL MANOMETRY N/A 07/31/2015   Procedure: ESOPHAGEAL MANOMETRY (EM);  Surgeon: Arta Silence, MD;  Location: [facility];  Service: Endoscopy;  Laterality: N/A;  . ESOPHAGOGASTRODUODENOSCOPY (EGD) WITH PROPOFOL N/A 09/02/2015   Procedure: ESOPHAGOGASTRODUODENOSCOPY (EGD) WITH PROPOFOL;  Surgeon: Arta Silence, MD;  Location: [facility];  Service: Endoscopy;  Laterality: N/A;  botulinum toxin injection (100 Units; 25 units in 4 quadrants 1-2 cm proximal to GE junction  . LEFT HEART CATH AND CORONARY ANGIOGRAPHY N/A 01/11/2017   Procedure: Left Heart Cath and Coronary Angiography;  Surgeon: Leonie Man, MD;  Location: [facility];  Service: Cardiovascular;  Laterality: N/A;  . PERICARDIAL FLUID DRAINAGE  02/15/2017   Procedure: DRAINAGE OF PERICARDIAL FLUID;  Surgeon: [name], MD;  Location: [place];  Service: Vascular;;  . skin cancer area removed      right leg healing well  . TEE WITHOUT CARDIOVERSION N/A 02/15/2017   Procedure: TRANSESOPHAGEAL ECHOCARDIOGRAM (TEE);  Surgeon: [name], MD;  Location: [place];  Service: Open Heart Surgery;  Laterality: N/A;  . VAGINAL HYSTERECTOMY      Medications: Prior to Admission medications   Medication Sig Start Date End Date Taking? Authorizing Provider  apixaban (ELIQUIS) 2.5 MG TABS tablet Take 1 tablet (2.5 mg total) by mouth 2 (two) times daily. 02/21/17   Barrett, Lodema Hong, PA-C  aspirin EC 81 MG EC tablet Take 1 tablet (81 mg  total) by mouth daily. 02/22/17   Barrett, Erin R, PA-C  atorvastatin (LIPITOR) 20 MG tablet Take 1 tablet (20 mg total) by mouth daily. 01/12/17   Delos Haring, PA-C  carvedilol (COREG) 3.125 MG tablet Take 1 tablet (3.125 mg total) by mouth 2 (two) times daily with a meal. 02/21/17   Barrett, Erin R, PA-C  digoxin (LANOXIN) 0.125 MG tablet Take 0.125 mg by mouth every morning.    [provider]  DULoxetine (CYMBALTA) 30 MG capsule Take 30 mg by mouth at bedtime.     [provider]  DULoxetine (CYMBALTA) 60 MG capsule Take 60 mg by mouth daily. 01/02/17   [provider]  furosemide (LASIX) 40 MG tablet Take 1 tablet (40 mg total) by mouth 2 (two) times daily. 02/21/17 02/21/18  Barrett, Lodema Hong, PA-C  omeprazole (PRILOSEC) 40 MG capsule Take 40 mg by mouth at bedtime.    [provider]  potassium chloride (KLOR-CON) 20 MEQ packet Take 40 mEq by mouth 2 (two) times daily. 02/21/17   Barrett, Erin R, PA-C  SYNTHROID 100 MCG tablet Take 100  mcg by mouth daily before breakfast. 06/26/15   [provider]  traMADol (ULTRAM) 50 MG tablet Take 1-2 tablets (50-100 mg total) by mouth every 4 (four) hours as needed for moderate pain. 02/21/17   Barrett, Lodema Hong, PA-C    Allergies:   Allergies  Allergen Reactions  . Demerol [Meperidine] Anaphylaxis    Reaction: "Heart stopped"    Social History:  Per chart, review, she had quit smoking. She has never used smokeless tobacco. No reports of alcohol or drugs.   Family History: Family History  Problem Relation Age of Onset  . Hypertension Mother   . Anuerysm Mother        died in her 52's  . Arthritis Father        died at age 7  . Heart attack Brother        died in 42's    Physical Exam: Blood pressure 103/85, pulse 73, temperature 98.1 F (36.7 C), temperature source Oral, resp. rate 18, height 5\' 3"  (1.6 m), weight 67.8 kg (149 lb 7.1 oz), SpO2 100 %. General: Currently unresponsive, on NRB  mask. HEENT: normocephalic, atraumatic, anicteric sclera, pink conjunctiva,  Neck: supple, no masses or lymphadenopathy, +JVD Heart: Irregularly irregular, wounds healing Lungs: Diffuse audible wheezing Abdomen: Soft, nontender, nondistended, positive bowel sounds, no masses. Extremities: No clubbing, cyanosis, 1+ Neuro: unable to assess Psych: unresponsive Skin: Sternal wound healing   LABS on Admission:  Basic Metabolic Panel:  Recent Labs Lab 02/22/17 0432 02/20/2017 0553  NA 129* 126*  K 4.4 4.9  CL 91* 89*  CO2 30 30  GLUCOSE 103* 125*  BUN 16 19  CREATININE 0.64 0.68  CALCIUM 8.5* 8.4*   Liver Function Tests:  Recent Labs Lab 02/21/17 0453 02/22/17 0432  AST 22 24  ALT 19 20  ALKPHOS 80 94  BILITOT 1.2 1.2  PROT 5.2* 5.7*  ALBUMIN 2.3* 2.4*   No results for input(s): LIPASE, AMYLASE in the last 168 hours. No results for input(s): AMMONIA in the last 168 hours. CBC:  Recent Labs Lab 02/22/17 0432 02/19/2017 1057  WBC 18.9* 22.3*  NEUTROABS 14.7* 18.3*  HGB 10.2* 9.4*  HCT 32.3* 29.1*  MCV 91.2 90.4  PLT 365 384   Cardiac Enzymes: No results for input(s): CKTOTAL, CKMB, CKMBINDEX, TROPONINI in the last 168 hours. BNP: Invalid input(s): POCBNP CBG:  Recent Labs Lab 02/20/17 2018 02/21/17 1134  GLUCAP 142* 82    Radiological Exams on Admission:   PORTABLE CHEST 1 VIEW  COMPARISON:  02/22/2017  FINDINGS: Postoperative changes in the mediastinum. Right PICC catheter with tip over the low SVC region. No pneumothorax. Cardiac enlargement with mild vascular congestion. Infiltration or atelectasis in the lung bases is increasing since previous study. Small bilateral pleural effusions. Calcified and ectatic aorta. Aortic shadow extends beyond the aortic calcifications, consistent with aortic dissection as was seen on previous CT 02/11/2017. Degenerative changes in the spine and shoulders.  IMPRESSION: Cardiac enlargement, pulmonary  vascular congestion, and increasing bilateral basilar infiltration or atelectasis. Small bilateral pleural effusions are unchanged. Calcified and ectatic aorta with soft tissue extending beyond the aortic calcification corresponding to known aortic dissection.   Electronically Signed   By: Lucienne Capers M.D.   On: 02/04/2017 06:46   *I have personally reviewed the images above*     Assessment/Plan  Patient is a 83 year old female with hypertension, persistently deformation on antiplatelet medication, degenerative arthritis, cervical disc disease, chronic pain syndrome, recent NSTEMI who was recently discharged to  inpatient rehabilitation on 02/21/17 by cardiothoracic surgery after repair of ascending thoracic aortic dissection, status post cardiac arrest today.  Principal Problem:   Cardiac arrest with underlying history of recent NSTEMI, acute on chronic diastolic CHF, recent repair of ascending thoracic aortic dissection  - Discussed in detail with patient's multiple family members and daughter at the bedside, who also requested patient to be complete comfort care at this time and they do not want to prolong her suffering anymore. DNR, DNI - Placed on morphine IV infusion, given bolus morphine injection 4 mg for acute shortness of breath/respiratory distress - Placed on Robinul, scopolamine patch, Haldol as needed as end-of-life palliative orders   - Palliative medicine consulted for assistance  DVT prophylaxis: No DVT prophylaxis, patient complete comfort care status  CODE STATUS: DNR/DNI, comfort care  Consults called: Palliative medicine  Family Communication: Admission, patients condition and plan of care including tests being ordered have been discussed with the patient's daughter, sister, multiple other family members in the room who indicates understanding and agree with the plan and Code Status.   Admission status: Inpatient MedSurg, palliative floor   Disposition  plan: inpatient expected death    At the time of admission, it appears that the appropriate admission status for this patient is INPATIENT . This is judged to be reasonable and necessary in order to provide the required intensity of service to ensure the patient's safety given the presenting symptoms, physical exam findings with respiratory distress, CODE BLUE , and initial radiographic and laboratory data in the context of their chronic comorbidities.     Time Spent on Admission: 70 minutes    Tarica Harl M.D. [facility] 02/25/2017, 5:44 PM Pager: [phone]  If 7PM-7AM, please contact night-coverage [email or web address] Password [email or web address]

## 2017-02-24 ENCOUNTER — Inpatient Hospital Stay (HOSPITAL_COMMUNITY): Payer: Medicare Other | Admitting: Occupational Therapy

## 2017-02-24 ENCOUNTER — Inpatient Hospital Stay (HOSPITAL_COMMUNITY): Payer: Medicare Other | Admitting: Speech Pathology

## 2017-02-24 ENCOUNTER — Inpatient Hospital Stay (HOSPITAL_COMMUNITY): Payer: Medicare Other | Admitting: Physical Therapy

## 2017-02-24 ENCOUNTER — Inpatient Hospital Stay (HOSPITAL_COMMUNITY): Payer: Medicare Other

## 2017-02-24 MED ORDER — GLYCOPYRROLATE 0.2 MG/ML IJ SOLN
0.4000 mg | INTRAMUSCULAR | Status: DC | PRN
Start: 2017-02-24 — End: 2017-02-25

## 2017-02-24 MED ORDER — MORPHINE BOLUS VIA INFUSION
3.0000 mg | INTRAVENOUS | Status: DC | PRN
  Administered 2017-02-24 (×4): 3 mg via INTRAVENOUS
  Filled 2017-02-24: qty 3

## 2017-02-24 MED ORDER — LORAZEPAM 2 MG/ML IJ SOLN
1.0000 mg | INTRAMUSCULAR | Status: DC | PRN
  Administered 2017-02-24 (×2): 1 mg via INTRAVENOUS
  Filled 2017-02-24 (×2): qty 1

## 2017-02-24 MED ORDER — SCOPOLAMINE 1 MG/3DAYS TD PT72
1.0000 | MEDICATED_PATCH | TRANSDERMAL | Status: DC
  Administered 2017-02-24: 1.5 mg via TRANSDERMAL
  Filled 2017-02-24: qty 1

## 2017-02-24 MED ORDER — GLYCOPYRROLATE 0.2 MG/ML IJ SOLN
0.2000 mg | INTRAMUSCULAR | Status: DC
Start: 2017-02-24 — End: 2017-02-25
  Administered 2017-02-24: 0.2 mg via INTRAVENOUS

## 2017-02-24 MED ORDER — SODIUM CHLORIDE 0.9% FLUSH
10.0000 mL | INTRAVENOUS | Status: DC | PRN
  Administered 2017-02-24: 10 mL
  Filled 2017-02-24: qty 40

## 2017-02-24 MED ORDER — ATROPINE SULFATE 1 % OP SOLN
1.0000 [drp] | OPHTHALMIC | Status: DC | PRN
  Administered 2017-02-24 (×2): 1 [drp] via SUBLINGUAL
  Filled 2017-02-24: qty 5

## 2017-02-24 MED ORDER — GLYCOPYRROLATE 0.2 MG/ML IJ SOLN
0.1000 mg | INTRAMUSCULAR | Status: DC
  Administered 2017-02-24 (×2): 0.1 mg via INTRAVENOUS
  Filled 2017-02-24: qty 1

## 2017-02-24 MED ORDER — SODIUM CHLORIDE 0.9 % IV SOLN
INTRAVENOUS | Status: DC
  Administered 2017-02-24: 10:00:00 via INTRAVENOUS

## 2017-02-25 MED FILL — Medication: Qty: 1 | Status: AC

## 2017-02-25 NOTE — Plan of Care (Signed)
Patient passed at 10:50 PM on 5/25 per RN

## 2017-03-03 NOTE — Death Summary Note (Addendum)
DEATH SUMMARY   Patient Details  Name: Lydia Jordan MRN: [id number] DOB: May 26, 1934  Admission/Discharge Information   Admit Date:  2017/02/27  Date of Death: Date of Death: 2017/02/28  Time of Death: Time of Death: Jan 03, 2249  Length of Stay: 2  Referring Physician: Lavone Orn, MD   Reason(s) for Hospitalization  Type A aortic dissection   Diagnoses  Preliminary cause of death:   Cardiac arrest  Secondary Diagnoses (including complications and co-morbidities):  Type A aortic dissection status post repair Recent NSTEMI Acute on chronic diastolic CHF Chronic pain syndrome Atrial fibrillation Hypothyroidism GERD   Brief Hospital Course (including significant findings, care, treatment, and services provided and events leading to death)  Lydia Jordan was a 83 y.o. year old female with hypertension, atrial fibrillation, degenerative arthritis, cervical disc disease, chronic pain syndrome who was recently discharged to inpatient rehabilitation on 5/22/18by cardiothoracic surgery after repair of ascending thoracic aorticdissection. Patient had recent NSTEMI in 4/18, patient had echo and cardiac cath at this time and no obstructive lesion was found and patient was recommended medical therapy. Patient was admitted again on 5/13to the cardiology service when she presented with a mechanical fall, hip pain and inability to walk. During the workup, she was found to have type Aaortic dissection on the CT chest. Cardiothoracic surgery was consulted. She was evaluated by Dr Marcelina Morel was cleared to undergo repair of aortic dissection with AVR, drainage of pericardial fluid, CABG x1 on 02/15/17. CIR was recommended and patient was discharged to inpatient rehabilitation on 02/21/17. Cardiology was consulted on 5/23 as patient was noted to be hypoxic with lower extremity edema, acute diastolic CHF and Lasix was increased to 60 mg IV BID. Patient was noted to be somewhat confused and was  attributed to hyponatremia. On 5/20 4 AM, patient had a rapid response with hypoxia and audible wheezing. Chest x-ray showed increasing bilateral basilar infiltrates, received nebulizer treatment, placed on NRB mask. Repeat echocardiogram was ordered. However, later around 4 PM, patient was noted to be in PEA and CODE BLUE was called. Patient was successfully resuscitated, however family/daughter requested patient to be DNR, DNI and comfort care at this time.    Cardiac arrest with underlying history of recent NSTEMI, acute on chronic diastolic CHF, recent repair of ascending thoracic aortic dissection -Patient was placed on comfort care goals and on morphine infusion, Haldol, glycopyrrolate, scopolamine patch for symptom control. Palliative medicine was consulted for assistance with symptom management.   Patient passed on Feb 28, 2017 at 10:50 PM    Pertinent Labs and Studies  Significant Diagnostic Studies Dg Chest 2 View  Result Date: 02/22/2017 CLINICAL DATA:  Leukocytosis EXAM: CHEST  2 VIEW COMPARISON:  02/20/2017 FINDINGS: Prior valve replacement. Cardiomegaly. Small bilateral pleural effusions. Areas of perihilar and lower lobe atelectasis. No overt edema. IMPRESSION: Cardiomegaly with vascular congestion. Perihilar and lower lobe atelectasis with small effusions. Electronically Signed   By: Rolm Baptise M.D.   On: 02/22/2017 10:26   Ct Head Wo Contrast  Result Date: 02/11/2017 CLINICAL DATA:  Patient fell Wednesday and hit the right side of her head and face on curb. Swelling and bruising on right side around orbit and frontal region. Patient has previous history of loss of disk height in her cervical spine EXAM: CT HEAD WITHOUT CONTRAST CT MAXILLOFACIAL WITHOUT CONTRAST CT CERVICAL SPINE WITHOUT CONTRAST TECHNIQUE: Multidetector CT imaging of the head, cervical spine, and maxillofacial structures were performed using the standard protocol without intravenous contrast. Multiplanar CT image  reconstructions of the cervical spine and maxillofacial structures were also generated. COMPARISON:  Head CT dated 02/10/2015. MRI of the cervical spine dated 07/20/2016. FINDINGS: CT HEAD FINDINGS Brain: There is mild generalized parenchymal atrophy with commensurate dilatation of the ventricles and sulci. Mild chronic small vessel ischemic changes noted within the periventricular and subcortical white matter. There is no mass, hemorrhage, edema or other evidence of acute parenchymal abnormality. No extra-axial hemorrhage. Vascular: There are chronic calcified atherosclerotic changes of the large vessels at the skull base. No unexpected hyperdense vessel. Skull: Chronic-appearing deformity of the right zygoma and right lateral orbital wall. No acute appearing fracture or displacement. Other: Soft tissue hematoma overlying the right frontal bone, measuring approximately 1.7 x 1 cm. No underlying fracture. CT MAXILLOFACIAL FINDINGS Osseous: Lower frontal bones are intact and normally aligned. Old mild deformity of the right lateral orbital wall, stable compared to the earlier head CT from 2016. Osseous structures about the orbits are otherwise intact and normally aligned bilaterally. No displaced nasal bone fracture. Old healed fracture of the right zygoma. Left zygoma is intact and normally aligned. Bilateral pterygoid plates appear intact and normally aligned. Walls of the maxillary sinuses are intact and normally aligned bilaterally. Temporal bones appear intact and normally aligned. Orbits: Negative. No traumatic or inflammatory finding. Sinuses: Clear. Soft tissues: Unremarkable. CT CERVICAL SPINE FINDINGS Alignment: There is reversal of the normal cervical spine lordosis, likely related to the underlying degenerative changes, stable compared to the earlier MRI given patient positioning. No evidence of acute vertebral body subluxation. Skull base and vertebrae: No fracture line or displaced fracture fragment  identified. Soft tissues and spinal canal: No prevertebral fluid or swelling. No visible canal hematoma. Disc levels: Degenerative changes throughout the cervical spine, with associated disc space narrowings and osseous spurring. Most significant degenerative change at the C5-6 and C6-7 levels with complete loss of disc space height and associated osseous spurring, likely source for the overlying reversal of normal cervical spine lordosis. There is, however, no more than mild central canal stenosis at any level. Degenerative hypertrophy of the uncovertebral and facet joints causing severe bilateral neural foramen stenoses at C3-4, severe bilateral neural foramen stenoses at C4-5, severe right neural foramen stenosis at C5-6 and moderate to severe bilateral neural foramen stenoses at C6-7. Suspect associated nerve root impingement at 1 or more levels. Upper chest: Mass versus consolidation at the left lung apex, incompletely imaged at the lower aspects of the chest CT. Other: Carotid atherosclerosis. IMPRESSION: 1. No acute intracranial abnormality. No intracranial mass, hemorrhage or edema. Chronic small vessel ischemic changes in the white matter. 2. Focal soft tissue hematoma overlying the right frontal bone. No underlying fracture. 3. No acute facial bone fracture or displacement. Old healed fractures of the right zygoma and right lateral orbital wall. 4. No fracture or acute subluxation within the cervical spine. Degenerative changes of the cervical spine, at least moderate in degree, as detailed above. 5. Masslike density at the left lung apex, incompletely imaged at the lower aspects of this chest CT. This likely represents the upper aspect of the ectatic thoracic aortic arch, but would consider chest CT to confirm benignity. These results were called by telephone at the time of interpretation on 02/11/2017 at 6:15 pm to Dr. Maryan Rued, who verbally acknowledged these results. Electronically Signed   By: Franki Cabot M.D.   On: 02/11/2017 18:18   Ct Chest W Contrast  Result Date: 02/11/2017 CLINICAL DATA:  Question of mass or prominent aortic arch  at the left lung apex on CT of the cervical spine. Further evaluation requested. EXAM: CT CHEST WITH CONTRAST TECHNIQUE: Multidetector CT imaging of the chest was performed during intravenous contrast administration. 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 The dissection extends along the ascending thoracic aorta, aortic arch and proximal descending thoracic aorta, and there appears to be partial thrombosis of the false lumen. An apparent spontaneous fenestration is noted at the level of the proximal aortic arch. The great vessels appear to arise from the true lumen of the thoracic aorta. The dissection appears to resolve at the proximal descending thoracic aorta. This may reflect lower pressure than usual due to the combination of spontaneous fenestration and extravasation into the patient's apparent moderate hemopericardium. Mediastinum/Nodes: A moderate pericardial effusion is noted, with areas of mildly increased attenuation, suspicious for hemopericardium. There also appears to be a trace left-sided pleural effusion, with mildly increased attenuation, concerning for trace left-sided hemothorax. This likely reflects extension of hemorrhage across the aortic adventitia into the pericardial space and left pleural space. No definite acute contrast extravasation is identified. Lungs/Pleura: Minimal bilateral atelectasis is noted. No pneumothorax is seen. As described above, there is suspicion of trace left-sided hemothorax. No masses are seen. Upper Abdomen: The  visualized abdominal aorta remains normal in caliber. There is no evidence of aortic dissection along the abdominal aorta at this time. Scattered calcification is seen along the proximal abdominal aorta. The visualized portions of the liver and spleen are grossly unremarkable. The visualized portions of the gallbladder, pancreas, adrenal glands and kidneys are within normal limits. A tiny hiatal hernia is noted. Musculoskeletal: No acute osseous abnormalities are identified. Degenerative joint space narrowing is noted at the right glenohumeral joint, with apparent scattered loose bodies and underlying diffuse joint space calcification. The visualized musculature is unremarkable in appearance. IMPRESSION: 1. 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 The dissection extends along the ascending thoracic aorta, aortic arch and proximal descending thoracic aorta, with partial thrombosis of the false lumen. Apparent spontaneous fenestration noted at the level of the proximal aortic arch. 2. Moderate pericardial effusion demonstrates areas of mildly increased attenuation, suspicious for hemopericardium. Trace left-sided pleural effusion also demonstrates mildly increased attenuation, concerning for trace left-sided hemothorax. This likely reflects extension of hemorrhage across the aortic adventitia to the pericardial space and left pleural space. No definite acute contrast extravasation seen at this time. 3. The dissection appears to resolve at the proximal descending thoracic aorta, and the distal descending thoracic aorta and abdominal aorta are unremarkable in appearance, aside from scattered aortic atherosclerosis. This may reflect lower pressure than usual due to the combination of spontaneous fenestration and extravasation into the patient's apparent moderate hemopericardium. The great vessels arise from the  true lumen. 4. Minimal bilateral atelectasis noted. 5. Degenerative change of the right glenohumeral joint, with scattered loose bodies and underlying diffuse joint space calcification. Critical Value/emergent results were called by telephone at the time of interpretation on 02/11/2017 at 10:44 pm to Dr. Blanchie Dessert, who verbally acknowledged these results. Electronically Signed   By: Garald Balding M.D.   On: 02/11/2017 22:59  Ct Cervical Spine Wo Contrast  Result Date: 02/11/2017 CLINICAL DATA:  Patient fell Wednesday and hit the right side of her head and face on curb. Swelling and bruising on right side around orbit and frontal region. Patient has previous history of loss of disk height in her cervical spine EXAM: CT HEAD WITHOUT CONTRAST CT MAXILLOFACIAL WITHOUT CONTRAST CT CERVICAL SPINE WITHOUT CONTRAST TECHNIQUE: Multidetector CT imaging of the head, cervical spine, and maxillofacial structures were performed using the standard protocol without intravenous contrast. Multiplanar CT image reconstructions of the cervical spine and maxillofacial structures were also generated. COMPARISON:  Head CT dated 02/10/2015. MRI of the cervical spine dated 07/20/2016. FINDINGS: CT HEAD FINDINGS Brain: There is mild generalized parenchymal atrophy with commensurate dilatation of the ventricles and sulci. Mild chronic small vessel ischemic changes noted within the periventricular and subcortical white matter. There is no mass, hemorrhage, edema or other evidence of acute parenchymal abnormality. No extra-axial hemorrhage. Vascular: There are chronic calcified atherosclerotic changes of the large vessels at the skull base. No unexpected hyperdense vessel. Skull: Chronic-appearing deformity of the right zygoma and right lateral orbital wall. No acute appearing fracture or displacement. Other: Soft tissue hematoma overlying the right frontal bone, measuring approximately 1.7 x 1 cm. No underlying fracture. CT  MAXILLOFACIAL FINDINGS Osseous: Lower frontal bones are intact and normally aligned. Old mild deformity of the right lateral orbital wall, stable compared to the earlier head CT from 2016. Osseous structures about the orbits are otherwise intact and normally aligned bilaterally. No displaced nasal bone fracture. Old healed fracture of the right zygoma. Left zygoma is intact and normally aligned. Bilateral pterygoid plates appear intact and normally aligned. Walls of the maxillary sinuses are intact and normally aligned bilaterally. Temporal bones appear intact and normally aligned. Orbits: Negative. No traumatic or inflammatory finding. Sinuses: Clear. Soft tissues: Unremarkable. CT CERVICAL SPINE FINDINGS Alignment: There is reversal of the normal cervical spine lordosis, likely related to the underlying degenerative changes, stable compared to the earlier MRI given patient positioning. No evidence of acute vertebral body subluxation. Skull base and vertebrae: No fracture line or displaced fracture fragment identified. Soft tissues and spinal canal: No prevertebral fluid or swelling. No visible canal hematoma. Disc levels: Degenerative changes throughout the cervical spine, with associated disc space narrowings and osseous spurring. Most significant degenerative change at the C5-6 and C6-7 levels with complete loss of disc space height and associated osseous spurring, likely source for the overlying reversal of normal cervical spine lordosis. There is, however, no more than mild central canal stenosis at any level. Degenerative hypertrophy of the uncovertebral and facet joints causing severe bilateral neural foramen stenoses at C3-4, severe bilateral neural foramen stenoses at C4-5, severe right neural foramen stenosis at C5-6 and moderate to severe bilateral neural foramen stenoses at C6-7. Suspect associated nerve root impingement at 1 or more levels. Upper chest: Mass versus consolidation at the left lung apex,  incompletely imaged at the lower aspects of the chest CT. Other: Carotid atherosclerosis. IMPRESSION: 1. No acute intracranial abnormality. No intracranial mass, hemorrhage or edema. Chronic small vessel ischemic changes in the white matter. 2. Focal soft tissue hematoma overlying the right frontal bone. No underlying fracture. 3. No acute facial bone fracture or displacement. Old healed fractures of the right zygoma and right lateral orbital wall. 4. No fracture or acute subluxation within the cervical spine. Degenerative changes of the cervical spine, at least moderate in degree, as detailed above. 5. Masslike density at the left lung  apex, incompletely imaged at the lower aspects of this chest CT. This likely represents the upper aspect of the ectatic thoracic aortic arch, but would consider chest CT to confirm benignity. These results were called by telephone at the time of interpretation on 02/11/2017 at 6:15 pm to Dr. Maryan Rued, who verbally acknowledged these results. Electronically Signed   By: Franki Cabot M.D.   On: 02/11/2017 18:18   Ct Hip Right Wo Contrast  Result Date: 02/11/2017 CLINICAL DATA:  83 year old female with acute right hip pain following fall three days ago. Initial encounter. EXAM: CT OF THE RIGHT HIP WITHOUT CONTRAST TECHNIQUE: Multidetector CT imaging of the right hip was performed according to the standard protocol. Multiplanar CT image reconstructions were also generated. COMPARISON:  07/10/2006 renogram and 06/06/2006 chest CT. FINDINGS: Bones/Joint/Cartilage Nondisplaced fractures of the superior and inferior right pubic rami are noted. No other fracture, subluxation or dislocation identified. Ligaments Suboptimally assessed by CT. Soft tissues Again noted is chronic right UPJ obstruction with moderate to severe hydronephrosis. IMPRESSION: Nondisplaced fractures of the superior and inferior right pubic rami. 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  Electronically Signed   By: Margarette Canada M.D.   On: 02/11/2017 18:36   Dg Chest Port 1 View  Result Date: 01/31/2017 CLINICAL DATA:  Shortness of breath EXAM: PORTABLE CHEST 1 VIEW COMPARISON:  02/22/2017 FINDINGS: Postoperative changes in the mediastinum. Right PICC catheter with tip over the low SVC region. No pneumothorax. Cardiac enlargement with mild vascular congestion. Infiltration or atelectasis in the lung bases is increasing since previous study. Small bilateral pleural effusions. Calcified and ectatic aorta. Aortic shadow extends beyond the aortic calcifications, consistent with aortic dissection as was seen on previous CT 02/11/2017. Degenerative changes in the spine and shoulders. IMPRESSION: Cardiac enlargement, pulmonary vascular congestion, and increasing bilateral basilar infiltration or atelectasis. Small bilateral pleural effusions are unchanged. Calcified and ectatic aorta with soft tissue extending beyond the aortic calcification corresponding to known aortic dissection. Electronically Signed   By: Lucienne Capers M.D.   On: 02/07/2017 06:46   Dg Chest Port 1 View  Result Date: 02/20/2017 CLINICAL DATA:  83 year old female post aortic valve replacement. Subsequent encounter. EXAM: PORTABLE CHEST 1 VIEW COMPARISON:  02/19/2017. FINDINGS: Right PICC line tip projects at the level of the distal superior vena cava/ cavoatrial junction. Right internal jugular catheter has been removed. Post aortic valve replacement and left atrial appendage clipping. Cardiomegaly. Central pulmonary vascular congestion. Perihilar/basilar subsegmental atelectasis. There may be small pleural effusions. No pneumothorax. Calcified tortuous aorta. Bilateral prominent shoulder joint degenerative changes. IMPRESSION: Right internal jugular catheter has been removed. Right PICC catheter tip distal superior vena cava/ cavoatrial junction level. Cardiomegaly post aortic valve replacement and left atrial appendage  clipping. Central pulmonary vascular prominence. Subsegmental atelectasis perihilar region lung bases. There may be small bilateral pleural effusions. Electronically Signed   By: Genia Del M.D.   On: 02/20/2017 07:36   Dg Chest Port 1 View  Result Date: 02/19/2017 CLINICAL DATA:  Central line placement.  Initial encounter. EXAM: PORTABLE CHEST 1 VIEW COMPARISON:  Chest radiograph performed earlier today at 4:44 a.m. FINDINGS: The patient's right PICC is noted ending about the mid SVC. A right IJ line is noted ending about the proximal SVC. A small left pleural effusion is suspected. No pneumothorax is seen. There is trace fluid tracking along the right minor fissure. The cardiomediastinal silhouette is mildly enlarged. The patient is status post median sternotomy. An aortic valve replacement is  noted. No acute osseous abnormalities are identified. Mild degenerative change is noted at the glenohumeral joints bilaterally, with chronic superior subluxation of the humeral heads. IMPRESSION: 1. Right PICC noted ending about the mid SVC. 2. Right IJ line noted ending about the proximal SVC. 3. Suspect small left pleural effusion. Trace fluid tracking along the right minor fissure. Mild cardiomegaly. Electronically Signed   By: Garald Balding M.D.   On: 02/19/2017 20:23   Dg Chest Port 1 View  Result Date: 02/19/2017 CLINICAL DATA:  Status post aortic valve replacement. EXAM: PORTABLE CHEST 1 VIEW COMPARISON:  02/18/2017 FINDINGS: Cyst the previous day's study, the right and left-sided chest tubes have been removed. No convincing pneumothorax. Mild atelectasis extends laterally from the right hilum, improved from the previous day's study. Left mid lung and right lower lung atelectasis noted on the prior exam has mostly resolved. No new lung abnormalities. No pulmonary edema. There is no mediastinal widening. The mediastinal tubes and right internal jugular introducer sheath are stable. IMPRESSION: 1. Status  post removal of the chest tubes. No convincing pneumothorax. 2. No acute finding or evidence of an operative complication. No mediastinal widening or pulmonary edema. Electronically Signed   By: Lajean Manes M.D.   On: 02/19/2017 07:17   Dg Chest Port 1 View  Result Date: 02/18/2017 CLINICAL DATA:  Atelectasis. Three days postop for aortic root replacement and repair of ascending aortic dissection as well as clipping of the atrial appendage. EXAM: PORTABLE CHEST 1 VIEW COMPARISON:  One-view chest x-ray 02/17/2017 FINDINGS: The heart is enlarged. Median sternotomy for aortic valve replacement and atrial clipping is again noted. Bilateral chest tubes remain place. There is no pneumothorax. Mediastinal drain is in place. Lung volumes remain low. Bilateral atelectasis is not significantly changed. A right IJ sheath is place. IMPRESSION: 1. Stable appearance of mediastinal and bilateral pleural catheter is without pneumothorax or pneumomediastinum. 2. Similar appearance of mild bilateral atelectasis. Electronically Signed   By: San Morelle M.D.   On: 02/18/2017 07:30   Dg Chest Port 1 View  Result Date: 02/17/2017 CLINICAL DATA:  Two days postop CABG, aortic root replacement procedure and repair of ascending aortic dissection. EXAM: PORTABLE CHEST 1 VIEW COMPARISON:  02/16/2017 FINDINGS: Cardiomegaly, superior mediastinal fullness and left atrial clip again identified. Mediastinal and bilateral thoracostomy tubes are again noted without pneumothorax. There has been removal of an endotracheal tube, NG tube and Swan-Ganz catheter with right IJ central venous catheter sheath remaining. Improved lung volumes noted with decreased scattered of atelectasis within both lungs. IMPRESSION: Improved lung volumes with decreased areas of atelectasis within both lungs. No other significant change. No evidence of pneumothorax. Unchanged cardiomediastinal silhouette. Electronically Signed   By: Margarette Canada M.D.   On:  02/17/2017 07:57   Dg Chest Port 1 View  Result Date: 02/16/2017 CLINICAL DATA:  Intubation. EXAM: PORTABLE CHEST 1 VIEW COMPARISON:  02/15/2017 . FINDINGS: Endotracheal tube, Swan-Ganz catheter, mediastinal drainage tube, bilateral chest tubes in stable position. NG tube is again noted within the esophagus. Prior CABG and cardiac valve replacement. Left atrial appendage clip. Stable mediastinal and cardiac prominence noted. Stable bilateral subsegmental atelectasis. No prominent pleural effusion or pneumothorax. IMPRESSION: 1. Lines and tubes including bilateral chest tubes in stable position. NG tube is again noted within the esophagus. No pneumothorax. 2. Prior CABG and cardiac valve replacement. Left atrial appendage clip noted. Stable prominence of the cardiomediastinal silhouette. 3.  Stable subsegmental atelectatic changes both lungs. Electronically Signed   By: Marcello Moores  Register   On: 02/16/2017 07:41   Dg Chest Port 1 View  Result Date: 02/15/2017 CLINICAL DATA:  Postop from CABG and aortic valve replacement. Atelectasis. EXAM: PORTABLE CHEST 1 VIEW COMPARISON:  02/13/2017 FINDINGS: Swan-Ganz catheter tip is seen in the proximal right pulmonary artery. Bilateral chest tubes are seen in appropriate position, and no pneumothorax visualized. Endotracheal tube is in appropriate position. Nasogastric tube is looped within the thoracic esophagus. Mediastinal drains are also seen. Patient has undergone CABG and aortic valve replacement. Mild cardiomegaly noted. Atelectasis is seen in the central right upper and left lower lobes. IMPRESSION: Atelectasis in central right upper and left lower lobes. No evidence of pneumothorax. Abnormal position of nasogastric tube, which is looped within the thoracic esophagus. These results will be called to the ordering clinician or representative by the Radiologist Assistant, and communication documented in the PACS or zVision Dashboard. Electronically Signed   By: Earle Gell M.D.   On: 02/15/2017 19:45   Dg Chest Port 1 View  Result Date: 02/13/2017 CLINICAL DATA:  Chest pain EXAM: PORTABLE CHEST 1 VIEW COMPARISON:  Chest radiograph January 10, 2017 and chest CT Feb 11, 2017 FINDINGS: There is cardiac enlargement; recent chest for CT demonstrated pericardial effusion. Aorta is prominent; recent CT demonstrated aortic dissection. There is mild atelectasis in the right mid lung. Lungs elsewhere clear. No adenopathy. There is degenerative change in each shoulder with superior migration of each humeral head. IMPRESSION: Aortic prominence consistent with known thoracic aortic dissection. There is aortic atherosclerosis. There is cardiomegaly with known pericardial effusion. Mild right midlung atelectasis. No edema or consolidation. Chronic rotator cuff tears bilaterally. Electronically Signed   By: Lowella Grip III M.D.   On: 02/13/2017 07:14   Dg Swallowing Func-speech Pathology  Result Date: 02/19/2017 Objective Swallowing Evaluation: Type of Study: MBS-Modified Barium Swallow Study Patient Details Name: EVANIE BUCKLE MRN: [id number] Date of Birth: 1934-08-12 Today's Date: 02/19/2017 Time: SLP Start Time (ACUTE ONLY): 1156-SLP Stop Time (ACUTE ONLY): 1220 SLP Time Calculation (min) (ACUTE ONLY): 24 min Past Medical History: Past Medical History: Diagnosis Date . Acid reflux  . Arthritis  . Ascending aortic aneurysm ([place]) 01/11/2017 . Ascending aortic dissection ([place]) 02/11/2017 . CAD (coronary artery disease)   Cath 01/11/17 showed 50% ost RCA and 50% mLAD --> medical therapy  . Cervical disc disease  . Chronic pain   Secondary to degenerative disc disease and arthritis . Degenerative arthritis  . Hydronephrosis of right kidney  . Hypertension  . Hypothyroidism  . Hypothyroidism  . Longstanding persistent atrial fibrillation ([place])   originally diagnosed 2007 . NSTEMI (non-ST elevated myocardial infarction) ([place]) 01/10/2017 . Pubic ramus fracture, right, closed, initial  encounter ([place]) 02/12/2017 . S/P ascending aortic dissection repair 02/15/2017  Straight graft replacement of sub-acute ascending aortic dissection with hemi-arch distal aortic reconstruction . 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 AORTIC VALVE REPLACEMENT N/A 02/15/2017  Procedure: AORTIC VALVE REPLACEMENT (AVR);  Surgeon: [name], MD;  Location: [facility];  Service: Open Heart Surgery;  Laterality: N/A; . BENTALL PROCEDURE N/A 02/15/2017  Procedure: BENTALL AORTIC ROOT REPLACEMENT PROCEDURE, REPAIR ASCENDING AORTIC DISSECTION;  Surgeon: [name], MD;  Location: [facility];  Service: Open Heart Surgery;  Laterality:  N/A; . CLIPPING OF ATRIAL APPENDAGE Left 02/15/2017  Procedure: CLIPPING OF ATRIAL APPENDAGE;  Surgeon: [name], MD;  Location: [facility];  Service: Open Heart Surgery;  Laterality: Left; . CORONARY ARTERY BYPASS GRAFT N/A 02/15/2017  Procedure: CORONARY ARTERY BYPASS GRAFTING (CABG) x  one, using left saphenous vein;  Surgeon: [name], MD;  Location: [facility];  Service: Vascular;  Laterality: N/A; . ESOPHAGEAL MANOMETRY N/A 07/31/2015  Procedure: ESOPHAGEAL MANOMETRY (EM);  Surgeon: Arta Silence, MD;  Location: [facility];  Service: Endoscopy;  Laterality: N/A; . ESOPHAGOGASTRODUODENOSCOPY (EGD) WITH PROPOFOL N/A 09/02/2015  Procedure: ESOPHAGOGASTRODUODENOSCOPY (EGD) WITH PROPOFOL;  Surgeon: Arta Silence, MD;  Location: [facility];  Service: Endoscopy;  Laterality: N/A;  botulinum toxin injection (100 Units; 25 units in 4 quadrants 1-2 cm proximal to GE junction . LEFT HEART CATH AND CORONARY ANGIOGRAPHY N/A 01/11/2017  Procedure: Left Heart Cath and Coronary Angiography;   Surgeon: Leonie Man, MD;  Location: [facility];  Service: Cardiovascular;  Laterality: N/A; . PERICARDIAL FLUID DRAINAGE  02/15/2017  Procedure: DRAINAGE OF PERICARDIAL FLUID;  Surgeon: [name], MD;  Location: [facility];  Service: Vascular;; . skin cancer area removed     right leg healing well . TEE WITHOUT CARDIOVERSION N/A 02/15/2017  Procedure: TRANSESOPHAGEAL ECHOCARDIOGRAM (TEE);  Surgeon: [name], MD;  Location: [facility];  Service: Open Heart Surgery;  Laterality: N/A; . VAGINAL HYSTERECTOMY   HPI: Pt is an 83 y.o. female with PMH of atrial fibrillation on eliquis, HTN, hypothyroidism, acid reflux, recent NSTEMI, aortic dissection. Pt had a mechanical fall on 5/9, during which time she bruised her face and hip. She had pain after the fall and felt she needed to be seen, therefore presenting 5/12. Given that she is on anticoagulation, she underwent CT of head and pelvis. Initial CT concerning for unclear mass vs. Consolidation at left lung apex, so repeat CT with contrast performed. CT showed type A dissection. Underwent CT surgery on 5/16 and was extubated 5/17. CXR 5/19 showed mild bilateral atelectasis. Bedside swallow eval ordered due to coughing observed with oral intake per MD. Pt currently on dysphagia 1 diet/ nectar thick liquids. barium swallow 06/2015: "Significant tertiary contractions in the mid and distal esophagus; short segment stricture of the distal esophagus just above the small hiatal hernia. The barium pill lodges at the level of the short segment distal esophageal stricture; prominent cricopharyngeus muscle. " Pt reports botox tx to esophagus.   Subjective: alert, communicative Assessment / Plan / Recommendation CHL IP CLINICAL IMPRESSIONS 02/19/2017 Clinical Impression Pt presents with a functional oropharyngeal swallow, marked by high penetration of thin liquids occasionally, not considered disordered in the elderly.  There was adequate mastication; good pharyngeal  clearance of POs; no aspiration.  Prominent cricopharyngeus was evident, and esophageal sweep revealed barium stasis filling the esophageal column and reaching cervical level.  Pt acknowledges hx of esophageal deficits.  For now, recommend changing diet to full liquids (thin liquids/straws permitted); pt should sit upright for meals and 45 minutes after.  She may benefit from repeat esophageal w/u when appropriate.  She is at risk for backflow/aspiration of esophageal material.  RN present; discussed results/recommendations.  SLP Visit Diagnosis Dysphagia, pharyngoesophageal phase (R13.14) Attention and concentration deficit following -- Frontal lobe and executive function deficit following -- Impact on safety and function Mild aspiration risk   CHL IP TREATMENT RECOMMENDATION 02/19/2017 Treatment Recommendations Therapy as outlined in treatment plan below   Prognosis 02/18/2017 Prognosis for Safe  Diet Advancement (No Data) Barriers to Reach Goals -- Barriers/Prognosis Comment -- CHL IP DIET RECOMMENDATION 02/19/2017 SLP Diet Recommendations Other (Comment) Liquid Administration via Straw Medication Administration Crushed with puree Compensations Follow solids with liquid Postural Changes Seated upright at 90 degrees   CHL IP OTHER RECOMMENDATIONS 02/19/2017 Recommended Consults Consider esophageal assessment Oral Care Recommendations Oral care BID Other Recommendations --   CHL IP FOLLOW UP RECOMMENDATIONS 02/19/2017 Follow up Recommendations (No Data)   CHL IP FREQUENCY AND DURATION 02/19/2017 Speech Therapy Frequency (ACUTE ONLY) min 2x/week Treatment Duration 1 week      CHL IP ORAL PHASE 02/19/2017 Oral Phase WFL Oral - Pudding Teaspoon -- Oral - Pudding Cup -- Oral - Honey Teaspoon -- Oral - Honey Cup -- Oral - Nectar Teaspoon -- Oral - Nectar Cup -- Oral - Nectar Straw -- Oral - Thin Teaspoon -- Oral - Thin Cup -- Oral - Thin Straw -- Oral - Puree -- Oral - Mech Soft -- Oral - Regular -- Oral - Multi-Consistency --  Oral - Pill -- Oral Phase - Comment --  CHL IP PHARYNGEAL PHASE 02/19/2017 Pharyngeal Phase WFL Pharyngeal- Pudding Teaspoon -- Pharyngeal -- Pharyngeal- Pudding Cup -- Pharyngeal -- Pharyngeal- Honey Teaspoon -- Pharyngeal -- Pharyngeal- Honey Cup -- Pharyngeal -- Pharyngeal- Nectar Teaspoon -- Pharyngeal -- Pharyngeal- Nectar Cup -- Pharyngeal -- Pharyngeal- Nectar Straw -- Pharyngeal -- Pharyngeal- Thin Teaspoon -- Pharyngeal -- Pharyngeal- Thin Cup -- Pharyngeal -- Pharyngeal- Thin Straw -- Pharyngeal -- Pharyngeal- Puree -- Pharyngeal -- Pharyngeal- Mechanical Soft -- Pharyngeal -- Pharyngeal- Regular -- Pharyngeal -- Pharyngeal- Multi-consistency -- Pharyngeal -- Pharyngeal- Pill -- Pharyngeal -- Pharyngeal Comment --  CHL IP CERVICAL ESOPHAGEAL PHASE 02/19/2017 Cervical Esophageal Phase Impaired Pudding Teaspoon -- Pudding Cup -- Honey Teaspoon -- Honey Cup -- Nectar Teaspoon -- Nectar Cup -- Nectar Straw -- Thin Teaspoon -- Thin Cup -- Thin Straw -- Puree -- Mechanical Soft -- Regular -- Multi-consistency -- Pill -- Cervical Esophageal Comment (No Data) No flowsheet data found. [name] 02/19/2017, 1:11 PM              Ct Maxillofacial Wo Contrast  Result Date: 02/11/2017 CLINICAL DATA:  Patient fell Wednesday and hit the right side of her head and face on curb. Swelling and bruising on right side around orbit and frontal region. Patient has previous history of loss of disk height in her cervical spine EXAM: CT HEAD WITHOUT CONTRAST CT MAXILLOFACIAL WITHOUT CONTRAST CT CERVICAL SPINE WITHOUT CONTRAST TECHNIQUE: Multidetector CT imaging of the head, cervical spine, and maxillofacial structures were performed using the standard protocol without intravenous contrast. Multiplanar CT image reconstructions of the cervical spine and maxillofacial structures were also generated. COMPARISON:  Head CT dated 02/10/2015. MRI of the cervical spine dated 07/20/2016. FINDINGS: CT HEAD FINDINGS Brain: There  is mild generalized parenchymal atrophy with commensurate dilatation of the ventricles and sulci. Mild chronic small vessel ischemic changes noted within the periventricular and subcortical white matter. There is no mass, hemorrhage, edema or other evidence of acute parenchymal abnormality. No extra-axial hemorrhage. Vascular: There are chronic calcified atherosclerotic changes of the large vessels at the skull base. No unexpected hyperdense vessel. Skull: Chronic-appearing deformity of the right zygoma and right lateral orbital wall. No acute appearing fracture or displacement. Other: Soft tissue hematoma overlying the right frontal bone, measuring approximately 1.7 x 1 cm. No underlying fracture. CT MAXILLOFACIAL FINDINGS Osseous: Lower frontal bones are intact and normally aligned. Old mild deformity of the right lateral  orbital wall, stable compared to the earlier head CT from 2016. Osseous structures about the orbits are otherwise intact and normally aligned bilaterally. No displaced nasal bone fracture. Old healed fracture of the right zygoma. Left zygoma is intact and normally aligned. Bilateral pterygoid plates appear intact and normally aligned. Walls of the maxillary sinuses are intact and normally aligned bilaterally. Temporal bones appear intact and normally aligned. Orbits: Negative. No traumatic or inflammatory finding. Sinuses: Clear. Soft tissues: Unremarkable. CT CERVICAL SPINE FINDINGS Alignment: There is reversal of the normal cervical spine lordosis, likely related to the underlying degenerative changes, stable compared to the earlier MRI given patient positioning. No evidence of acute vertebral body subluxation. Skull base and vertebrae: No fracture line or displaced fracture fragment identified. Soft tissues and spinal canal: No prevertebral fluid or swelling. No visible canal hematoma. Disc levels: Degenerative changes throughout the cervical spine, with associated disc space narrowings and  osseous spurring. Most significant degenerative change at the C5-6 and C6-7 levels with complete loss of disc space height and associated osseous spurring, likely source for the overlying reversal of normal cervical spine lordosis. There is, however, no more than mild central canal stenosis at any level. Degenerative hypertrophy of the uncovertebral and facet joints causing severe bilateral neural foramen stenoses at C3-4, severe bilateral neural foramen stenoses at C4-5, severe right neural foramen stenosis at C5-6 and moderate to severe bilateral neural foramen stenoses at C6-7. Suspect associated nerve root impingement at 1 or more levels. Upper chest: Mass versus consolidation at the left lung apex, incompletely imaged at the lower aspects of the chest CT. Other: Carotid atherosclerosis. IMPRESSION: 1. No acute intracranial abnormality. No intracranial mass, hemorrhage or edema. Chronic small vessel ischemic changes in the white matter. 2. Focal soft tissue hematoma overlying the right frontal bone. No underlying fracture. 3. No acute facial bone fracture or displacement. Old healed fractures of the right zygoma and right lateral orbital wall. 4. No fracture or acute subluxation within the cervical spine. Degenerative changes of the cervical spine, at least moderate in degree, as detailed above. 5. Masslike density at the left lung apex, incompletely imaged at the lower aspects of this chest CT. This likely represents the upper aspect of the ectatic thoracic aortic arch, but would consider chest CT to confirm benignity. These results were called by telephone at the time of interpretation on 02/11/2017 at 6:15 pm to Dr. Maryan Rued, who verbally acknowledged these results. Electronically Signed   By: Franki Cabot M.D.   On: 02/11/2017 18:18    Microbiology Recent Results (from the past 240 hour(s))  Culture, blood (Routine X 2) w Reflex to ID Panel     Status: None   Collection Time: 02/17/17  9:00 AM  Result  Value Ref Range Status   Specimen Description BLOOD RIGHT ARM  Final   Special Requests IN PEDIATRIC BOTTLE Blood Culture adequate volume  Final   Culture NO GROWTH 5 DAYS  Final   Report Status 02/22/2017 FINAL  Final  Culture, blood (Routine X 2) w Reflex to ID Panel     Status: None   Collection Time: 02/17/17  9:18 AM  Result Value Ref Range Status   Specimen Description BLOOD RIGHT ARM  Final   Special Requests IN PEDIATRIC BOTTLE Blood Culture adequate volume  Final   Culture NO GROWTH 5 DAYS  Final   Report Status 02/22/2017 FINAL  Final  Urine culture     Status: None   Collection Time: 02/22/17  7:17  AM  Result Value Ref Range Status   Specimen Description URINE, RANDOM  Final   Special Requests NONE  Final   Culture NO GROWTH  Final   Report Status 02/12/2017 FINAL  Final    Lab Basic Metabolic Panel:  Recent Labs Lab 02/19/17 0437 02/19/17 1559 02/20/17 0440 02/21/17 0453 02/22/17 0432 02/10/2017 0553  NA 133* 134* 132* 133* 129* 126*  K 3.2* 3.4* 3.3* 4.3 4.4 4.9  CL 97* 92* 95* 95* 91* 89*  CO2 30  --  30 32 30 30  GLUCOSE 94 118* 102* 88 103* 125*  BUN 17 19 14 17 16 19   CREATININE 0.75 0.70 0.60 0.62 0.64 0.68  CALCIUM 8.2*  --  7.7* 8.2* 8.5* 8.4*   Liver Function Tests:  Recent Labs Lab 02/21/17 0453 02/22/17 0432  AST 22 24  ALT 19 20  ALKPHOS 80 94  BILITOT 1.2 1.2  PROT 5.2* 5.7*  ALBUMIN 2.3* 2.4*   No results for input(s): LIPASE, AMYLASE in the last 168 hours. No results for input(s): AMMONIA in the last 168 hours. CBC:  Recent Labs Lab 02/19/17 0437 02/19/17 1559 02/20/17 0440 02/21/17 0453 02/22/17 0432 01/31/2017 1057  WBC 16.9*  --  14.8* 15.8* 18.9* 22.3*  NEUTROABS  --   --   --   --  14.7* 18.3*  HGB 10.5* 10.2* 10.0* 10.5* 10.2* 9.4*  HCT 31.7* 30.0* 31.1* 33.1* 32.3* 29.1*  MCV 89.5  --  89.9 91.9 91.2 90.4  PLT 217  --  232 276 365 384   Cardiac Enzymes: No results for input(s): CKTOTAL, CKMB, CKMBINDEX, TROPONINI  in the last 168 hours. Sepsis Labs:  Recent Labs Lab 02/20/17 0440 02/21/17 0453 02/22/17 0432 02/05/2017 1057  WBC 14.8* 15.8* 18.9* 22.3*    Procedures/Operations   Repair of Ascending Thoracic Aortic Dissection  Biological Bentall Aortic Root Replacement  Coronary Artery Bypass Grafting x1  Clipping of Left Atrial Appendage  Drainage of Pericardial Effusion                Kiari Hosmer 02/25/2017, 6:20 AM   Coding queries  addendum:  1) It was speculated that patient may have PE per Dr Guy Sandifer note but please note that I admitted the patient AFTER she had coded and was placed on comfort care status. Hence, no further tests and interventions were done to rule out or rule in any medical problems including PE.Marland Kitchen   2) Acute metabolic encephalopathy with acute hypoxic respiratory failure: As I stated above, I saw the patient after the code blue, she was on NRB mask and hypoxic.  She was unable to respond to any questions.   3) Acute hypoxic respiratory failure on NRB mask     Lani Havlik M.D. [facility] 03/10/2017, 4:37 PM  Pager: [phone]

## 2017-03-03 NOTE — Progress Notes (Signed)
[facility]                                                                              Patient Demographics  Lydia Jordan, is a 83 y.o. female, DOB - 18-May-1934, EHU:[id number]  Admit date - 02/17/2017   Admitting Physician Mutasim Tuckey Krystal Eaton, MD  Outpatient Primary MD for the patient is Lavone Orn, MD  Outpatient specialists:   LOS - 1  days    No chief complaint on file.      Brief summary  Patient is a 83 year old female with hypertension, persistently deformation on antiplatelet medication, degenerative arthritis, cervical disc disease, chronic pain syndrome who was recently discharged to inpatient rehabilitation on 02/21/17 by cardiothoracic surgery after repair of ascending thoracic aortic dissection. Patient had recent NSTEMI in 4/18, patient had echo and cardiac cath at this time and no obstructive lesion was found and patient was recommended medical therapy. Patient was admitted again on 5/13 to the cardiology service when she presented with a mechanical fall, hip pain and inability to walk. During the workup, she was found to have type A aortic dissection on the CT chest. Cardiothoracic surgery was consulted. She was evaluated by Dr Roxy Manns and was cleared to undergo repair of aortic dissection with AVR, drainage of pericardial fluid, CABG x1 on 02/15/17. CIR was recommended and patient was discharged to inpatient rehabilitation on 02/21/17. Cardiology was consulted on 5/23 as patient was noted to be hypoxic with lower extremity edema, acute diastolic CHF and Lasix was increased to 60 mg IV BID. Patient was noted to be somewhat confused and was attributed to hyponatremia. On 5/20 4 AM, patient had a rapid response with hypoxia and audible wheezing. Chest x-ray showed increasing bilateral basilar infiltrates, received nebulizer treatment, placed on NRB mask. Repeat echocardiogram was ordered. However, later around 4 PM, patient was noted to be in PEA and CODE BLUE was  called. Patient was successfully resuscitated, however family/daughter requested patient to be DNR, DNI and comfort care at this time.     Assessment & Plan   Principal Problem:   Cardiac arrest with underlying history of recent NSTEMI, acute on chronic diastolic CHF, recent repair of ascending thoracic aortic dissection  -Continue comfort care goals, on morphine infusion, appears to be comfortable, actively dying - Multiple family members at the bedside, offered support  Code Status: DNR/DNI, DVT Prophylaxis:   Family Communication: Multiple family members at the bedside including daughter and granddaughter  Disposition Plan: Expecting hospital death  Time Spent in minutes   20 minutes  Procedures:    Consultants:   Palliative medicine  Antimicrobials:      Medications  Scheduled Meds: . glycopyrrolate  0.1 mg Intravenous Q4H  . scopolamine  1 patch Transdermal Q72H   Continuous Infusions: . sodium chloride 10 mL/hr at 03-11-17 1015  . morphine 6 mg/hr (11-Mar-2017 0804)   PRN Meds:.atropine, [DISCONTINUED] glycopyrrolate **OR** [DISCONTINUED] glycopyrrolate **OR** glycopyrrolate, LORazepam, morphine, [DISCONTINUED] ondansetron **OR** ondansetron (ZOFRAN) IV, sodium chloride flush   Antibiotics   Anti-infectives    None        Subjective:   Lydia Jordan was seen and examined  today.  Appears to be comfortable on morphine infusion, gurgling, having secretions was just suctioned  Objective:   Vitals:   02/10/2017 2251  BP: 135/66  Pulse: 68  Resp: 18  Temp: 97.5 F (36.4 C)  TempSrc: Oral  SpO2: 100%    Intake/Output Summary (Last 24 hours) at 03/19/17 1241 Last data filed at 19-Mar-2017 3825  Gross per 24 hour  Intake            27.73 ml  Output                0 ml  Net            27.73 ml     Wt Readings from Last 3 Encounters:  02/22/2017 67.8 kg (149 lb 7.1 oz)  02/21/17 66.6 kg (146 lb 12.8 oz)  01/26/17 58.1 kg (128 lb)      Exam  General: Unresponsive  HEENT:    Neck:   Cardiovascular: S1 S2 clear  Respiratory: Diffuse rhonchi  Gastrointestinal: Not examined  Ext: not examined  Neuro: unresponsive   Skin:   Psych: unresponsive   Data Reviewed:  I have personally reviewed following labs and imaging studies  Micro Results Recent Results (from the past 240 hour(s))  Culture, blood (Routine X 2) w Reflex to ID Panel     Status: None   Collection Time: 02/17/17  9:00 AM  Result Value Ref Range Status   Specimen Description BLOOD RIGHT ARM  Final   Special Requests IN PEDIATRIC BOTTLE Blood Culture adequate volume  Final   Culture NO GROWTH 5 DAYS  Final   Report Status 02/22/2017 FINAL  Final  Culture, blood (Routine X 2) w Reflex to ID Panel     Status: None   Collection Time: 02/17/17  9:18 AM  Result Value Ref Range Status   Specimen Description BLOOD RIGHT ARM  Final   Special Requests IN PEDIATRIC BOTTLE Blood Culture adequate volume  Final   Culture NO GROWTH 5 DAYS  Final   Report Status 02/22/2017 FINAL  Final  Urine culture     Status: None   Collection Time: 02/22/17  7:17 AM  Result Value Ref Range Status   Specimen Description URINE, RANDOM  Final   Special Requests NONE  Final   Culture NO GROWTH  Final   Report Status 02/23/2017 FINAL  Final    Radiology Reports Dg Chest 2 View  Result Date: 02/22/2017 CLINICAL DATA:  Leukocytosis EXAM: CHEST  2 VIEW COMPARISON:  02/20/2017 FINDINGS: Prior valve replacement. Cardiomegaly. Small bilateral pleural effusions. Areas of perihilar and lower lobe atelectasis. No overt edema. IMPRESSION: Cardiomegaly with vascular congestion. Perihilar and lower lobe atelectasis with small effusions. Electronically Signed   By: Rolm Baptise M.D.   On: 02/22/2017 10:26   Ct Head Wo Contrast  Result Date: 02/11/2017 CLINICAL DATA:  Patient fell Wednesday and hit the right side of her head and face on curb. Swelling and bruising on right  side around orbit and frontal region. Patient has previous history of loss of disk height in her cervical spine EXAM: CT HEAD WITHOUT CONTRAST CT MAXILLOFACIAL WITHOUT CONTRAST CT CERVICAL SPINE WITHOUT CONTRAST TECHNIQUE: Multidetector CT imaging of the head, cervical spine, and maxillofacial structures were performed using the standard protocol without intravenous contrast. Multiplanar CT image reconstructions of the cervical spine and maxillofacial structures were also generated. COMPARISON:  Head CT dated 02/10/2015. MRI of the cervical spine dated 07/20/2016. FINDINGS: CT HEAD FINDINGS Brain: There  is mild generalized parenchymal atrophy with commensurate dilatation of the ventricles and sulci. Mild chronic small vessel ischemic changes noted within the periventricular and subcortical white matter. There is no mass, hemorrhage, edema or other evidence of acute parenchymal abnormality. No extra-axial hemorrhage. Vascular: There are chronic calcified atherosclerotic changes of the large vessels at the skull base. No unexpected hyperdense vessel. Skull: Chronic-appearing deformity of the right zygoma and right lateral orbital wall. No acute appearing fracture or displacement. Other: Soft tissue hematoma overlying the right frontal bone, measuring approximately 1.7 x 1 cm. No underlying fracture. CT MAXILLOFACIAL FINDINGS Osseous: Lower frontal bones are intact and normally aligned. Old mild deformity of the right lateral orbital wall, stable compared to the earlier head CT from 2016. Osseous structures about the orbits are otherwise intact and normally aligned bilaterally. No displaced nasal bone fracture. Old healed fracture of the right zygoma. Left zygoma is intact and normally aligned. Bilateral pterygoid plates appear intact and normally aligned. Walls of the maxillary sinuses are intact and normally aligned bilaterally. Temporal bones appear intact and normally aligned. Orbits: Negative. No traumatic or  inflammatory finding. Sinuses: Clear. Soft tissues: Unremarkable. CT CERVICAL SPINE FINDINGS Alignment: There is reversal of the normal cervical spine lordosis, likely related to the underlying degenerative changes, stable compared to the earlier MRI given patient positioning. No evidence of acute vertebral body subluxation. Skull base and vertebrae: No fracture line or displaced fracture fragment identified. Soft tissues and spinal canal: No prevertebral fluid or swelling. No visible canal hematoma. Disc levels: Degenerative changes throughout the cervical spine, with associated disc space narrowings and osseous spurring. Most significant degenerative change at the C5-6 and C6-7 levels with complete loss of disc space height and associated osseous spurring, likely source for the overlying reversal of normal cervical spine lordosis. There is, however, no more than mild central canal stenosis at any level. Degenerative hypertrophy of the uncovertebral and facet joints causing severe bilateral neural foramen stenoses at C3-4, severe bilateral neural foramen stenoses at C4-5, severe right neural foramen stenosis at C5-6 and moderate to severe bilateral neural foramen stenoses at C6-7. Suspect associated nerve root impingement at 1 or more levels. Upper chest: Mass versus consolidation at the left lung apex, incompletely imaged at the lower aspects of the chest CT. Other: Carotid atherosclerosis. IMPRESSION: 1. No acute intracranial abnormality. No intracranial mass, hemorrhage or edema. Chronic small vessel ischemic changes in the white matter. 2. Focal soft tissue hematoma overlying the right frontal bone. No underlying fracture. 3. No acute facial bone fracture or displacement. Old healed fractures of the right zygoma and right lateral orbital wall. 4. No fracture or acute subluxation within the cervical spine. Degenerative changes of the cervical spine, at least moderate in degree, as detailed above. 5. Masslike  density at the left lung apex, incompletely imaged at the lower aspects of this chest CT. This likely represents the upper aspect of the ectatic thoracic aortic arch, but would consider chest CT to confirm benignity. These results were called by telephone at the time of interpretation on 02/11/2017 at 6:15 pm to Dr. Maryan Rued, who verbally acknowledged these results. Electronically Signed   By: Franki Cabot M.D.   On: 02/11/2017 18:18   Ct Chest W Contrast  Result Date: 02/11/2017 CLINICAL DATA:  Question of mass or prominent aortic arch at the left lung apex on CT of the cervical spine. Further evaluation requested. EXAM: CT CHEST WITH CONTRAST TECHNIQUE: Multidetector CT imaging of the chest was performed during intravenous  contrast administration. 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 The dissection extends along the ascending thoracic aorta, aortic arch and proximal descending thoracic aorta, and there appears to be partial thrombosis of the false lumen. An apparent spontaneous fenestration is noted at the level of the proximal aortic arch. The great vessels appear to arise from the true lumen of the thoracic aorta. The dissection appears to resolve at the proximal descending thoracic aorta. This may reflect lower pressure than usual due to the combination of spontaneous fenestration and extravasation into the patient's apparent moderate hemopericardium. Mediastinum/Nodes: A moderate pericardial effusion is noted, with areas of mildly increased attenuation, suspicious for hemopericardium. There also appears to be a trace left-sided pleural effusion, with mildly increased attenuation, concerning  for trace left-sided hemothorax. This likely reflects extension of hemorrhage across the aortic adventitia into the pericardial space and left pleural space. No definite acute contrast extravasation is identified. Lungs/Pleura: Minimal bilateral atelectasis is noted. No pneumothorax is seen. As described above, there is suspicion of trace left-sided hemothorax. No masses are seen. Upper Abdomen: The visualized abdominal aorta remains normal in caliber. There is no evidence of aortic dissection along the abdominal aorta at this time. Scattered calcification is seen along the proximal abdominal aorta. The visualized portions of the liver and spleen are grossly unremarkable. The visualized portions of the gallbladder, pancreas, adrenal glands and kidneys are within normal limits. A tiny hiatal hernia is noted. Musculoskeletal: No acute osseous abnormalities are identified. Degenerative joint space narrowing is noted at the right glenohumeral joint, with apparent scattered loose bodies and underlying diffuse joint space calcification. The visualized musculature is unremarkable in appearance. IMPRESSION: 1. 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 The dissection extends along the ascending thoracic aorta, aortic arch and proximal descending thoracic aorta, with partial thrombosis of the false lumen. Apparent spontaneous fenestration noted at the level of the proximal aortic arch. 2. Moderate pericardial effusion demonstrates areas of mildly increased attenuation, suspicious for hemopericardium. Trace left-sided pleural effusion also demonstrates mildly increased attenuation, concerning for trace left-sided hemothorax. This likely reflects extension of hemorrhage across the aortic adventitia to the pericardial space and left pleural space. No definite acute contrast extravasation seen at this time. 3. The  dissection appears to resolve at the proximal descending thoracic aorta, and the distal descending thoracic aorta and abdominal aorta are unremarkable in appearance, aside from scattered aortic atherosclerosis. This may reflect lower pressure than usual due to the combination of spontaneous fenestration and extravasation into the patient's apparent moderate hemopericardium. The great vessels arise from the true lumen. 4. Minimal bilateral atelectasis noted. 5. Degenerative change of the right glenohumeral joint, with scattered loose bodies and underlying diffuse joint space calcification. Critical Value/emergent results were called by telephone at the time of interpretation on 02/11/2017 at 10:44 pm to Dr. Blanchie Dessert, who verbally acknowledged these results. Electronically Signed   By: Garald Balding M.D.   On: 02/11/2017 22:59   Ct Cervical Spine Wo Contrast  Result Date: 02/11/2017 CLINICAL DATA:  Patient fell Wednesday and hit the right side of her head and face on curb. Swelling and bruising  on right side around orbit and frontal region. Patient has previous history of loss of disk height in her cervical spine EXAM: CT HEAD WITHOUT CONTRAST CT MAXILLOFACIAL WITHOUT CONTRAST CT CERVICAL SPINE WITHOUT CONTRAST TECHNIQUE: Multidetector CT imaging of the head, cervical spine, and maxillofacial structures were performed using the standard protocol without intravenous contrast. Multiplanar CT image reconstructions of the cervical spine and maxillofacial structures were also generated. COMPARISON:  Head CT dated 02/10/2015. MRI of the cervical spine dated 07/20/2016. FINDINGS: CT HEAD FINDINGS Brain: There is mild generalized parenchymal atrophy with commensurate dilatation of the ventricles and sulci. Mild chronic small vessel ischemic changes noted within the periventricular and subcortical white matter. There is no mass, hemorrhage, edema or other evidence of acute parenchymal abnormality. No extra-axial  hemorrhage. Vascular: There are chronic calcified atherosclerotic changes of the large vessels at the skull base. No unexpected hyperdense vessel. Skull: Chronic-appearing deformity of the right zygoma and right lateral orbital wall. No acute appearing fracture or displacement. Other: Soft tissue hematoma overlying the right frontal bone, measuring approximately 1.7 x 1 cm. No underlying fracture. CT MAXILLOFACIAL FINDINGS Osseous: Lower frontal bones are intact and normally aligned. Old mild deformity of the right lateral orbital wall, stable compared to the earlier head CT from 2016. Osseous structures about the orbits are otherwise intact and normally aligned bilaterally. No displaced nasal bone fracture. Old healed fracture of the right zygoma. Left zygoma is intact and normally aligned. Bilateral pterygoid plates appear intact and normally aligned. Walls of the maxillary sinuses are intact and normally aligned bilaterally. Temporal bones appear intact and normally aligned. Orbits: Negative. No traumatic or inflammatory finding. Sinuses: Clear. Soft tissues: Unremarkable. CT CERVICAL SPINE FINDINGS Alignment: There is reversal of the normal cervical spine lordosis, likely related to the underlying degenerative changes, stable compared to the earlier MRI given patient positioning. No evidence of acute vertebral body subluxation. Skull base and vertebrae: No fracture line or displaced fracture fragment identified. Soft tissues and spinal canal: No prevertebral fluid or swelling. No visible canal hematoma. Disc levels: Degenerative changes throughout the cervical spine, with associated disc space narrowings and osseous spurring. Most significant degenerative change at the C5-6 and C6-7 levels with complete loss of disc space height and associated osseous spurring, likely source for the overlying reversal of normal cervical spine lordosis. There is, however, no more than mild central canal stenosis at any level.  Degenerative hypertrophy of the uncovertebral and facet joints causing severe bilateral neural foramen stenoses at C3-4, severe bilateral neural foramen stenoses at C4-5, severe right neural foramen stenosis at C5-6 and moderate to severe bilateral neural foramen stenoses at C6-7. Suspect associated nerve root impingement at 1 or more levels. Upper chest: Mass versus consolidation at the left lung apex, incompletely imaged at the lower aspects of the chest CT. Other: Carotid atherosclerosis. IMPRESSION: 1. No acute intracranial abnormality. No intracranial mass, hemorrhage or edema. Chronic small vessel ischemic changes in the white matter. 2. Focal soft tissue hematoma overlying the right frontal bone. No underlying fracture. 3. No acute facial bone fracture or displacement. Old healed fractures of the right zygoma and right lateral orbital wall. 4. No fracture or acute subluxation within the cervical spine. Degenerative changes of the cervical spine, at least moderate in degree, as detailed above. 5. Masslike density at the left lung apex, incompletely imaged at the lower aspects of this chest CT. This likely represents the upper aspect of the ectatic thoracic aortic arch, but would consider chest CT to confirm  benignity. These results were called by telephone at the time of interpretation on 02/11/2017 at 6:15 pm to Dr. Maryan Rued, who verbally acknowledged these results. Electronically Signed   By: Franki Cabot M.D.   On: 02/11/2017 18:18   Ct Hip Right Wo Contrast  Result Date: 02/11/2017 CLINICAL DATA:  83 year old female with acute right hip pain following fall three days ago. Initial encounter. EXAM: CT OF THE RIGHT HIP WITHOUT CONTRAST TECHNIQUE: Multidetector CT imaging of the right hip was performed according to the standard protocol. Multiplanar CT image reconstructions were also generated. COMPARISON:  07/10/2006 renogram and 06/06/2006 chest CT. FINDINGS: Bones/Joint/Cartilage Nondisplaced fractures  of the superior and inferior right pubic rami are noted. No other fracture, subluxation or dislocation identified. Ligaments Suboptimally assessed by CT. Soft tissues Again noted is chronic right UPJ obstruction with moderate to severe hydronephrosis. IMPRESSION: Nondisplaced fractures of the superior and inferior right pubic rami. 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 Electronically Signed   By: Margarette Canada M.D.   On: 02/11/2017 18:36   Dg Chest Port 1 View  Result Date: 02/26/2017 CLINICAL DATA:  Shortness of breath EXAM: PORTABLE CHEST 1 VIEW COMPARISON:  02/22/2017 FINDINGS: Postoperative changes in the mediastinum. Right PICC catheter with tip over the low SVC region. No pneumothorax. Cardiac enlargement with mild vascular congestion. Infiltration or atelectasis in the lung bases is increasing since previous study. Small bilateral pleural effusions. Calcified and ectatic aorta. Aortic shadow extends beyond the aortic calcifications, consistent with aortic dissection as was seen on previous CT 02/11/2017. Degenerative changes in the spine and shoulders. IMPRESSION: Cardiac enlargement, pulmonary vascular congestion, and increasing bilateral basilar infiltration or atelectasis. Small bilateral pleural effusions are unchanged. Calcified and ectatic aorta with soft tissue extending beyond the aortic calcification corresponding to known aortic dissection. Electronically Signed   By: Lucienne Capers M.D.   On: 02/03/2017 06:46   Dg Chest Port 1 View  Result Date: 02/20/2017 CLINICAL DATA:  83 year old female post aortic valve replacement. Subsequent encounter. EXAM: PORTABLE CHEST 1 VIEW COMPARISON:  02/19/2017. FINDINGS: Right PICC line tip projects at the level of the distal superior vena cava/ cavoatrial junction. Right internal jugular catheter has been removed. Post aortic valve replacement and left atrial appendage clipping. Cardiomegaly. Central pulmonary vascular  congestion. Perihilar/basilar subsegmental atelectasis. There may be small pleural effusions. No pneumothorax. Calcified tortuous aorta. Bilateral prominent shoulder joint degenerative changes. IMPRESSION: Right internal jugular catheter has been removed. Right PICC catheter tip distal superior vena cava/ cavoatrial junction level. Cardiomegaly post aortic valve replacement and left atrial appendage clipping. Central pulmonary vascular prominence. Subsegmental atelectasis perihilar region lung bases. There may be small bilateral pleural effusions. Electronically Signed   By: Genia Del M.D.   On: 02/20/2017 07:36   Dg Chest Port 1 View  Result Date: 02/19/2017 CLINICAL DATA:  Central line placement.  Initial encounter. EXAM: PORTABLE CHEST 1 VIEW COMPARISON:  Chest radiograph performed earlier today at 4:44 a.m. FINDINGS: The patient's right PICC is noted ending about the mid SVC. A right IJ line is noted ending about the proximal SVC. A small left pleural effusion is suspected. No pneumothorax is seen. There is trace fluid tracking along the right minor fissure. The cardiomediastinal silhouette is mildly enlarged. The patient is status post median sternotomy. An aortic valve replacement is noted. No acute osseous abnormalities are identified. Mild degenerative change is noted at the glenohumeral joints bilaterally, with chronic superior subluxation of the humeral heads. IMPRESSION: 1. Right PICC noted  ending about the mid SVC. 2. Right IJ line noted ending about the proximal SVC. 3. Suspect small left pleural effusion. Trace fluid tracking along the right minor fissure. Mild cardiomegaly. Electronically Signed   By: Garald Balding M.D.   On: 02/19/2017 20:23   Dg Chest Port 1 View  Result Date: 02/19/2017 CLINICAL DATA:  Status post aortic valve replacement. EXAM: PORTABLE CHEST 1 VIEW COMPARISON:  02/18/2017 FINDINGS: Cyst the previous day's study, the right and left-sided chest tubes have been removed.  No convincing pneumothorax. Mild atelectasis extends laterally from the right hilum, improved from the previous day's study. Left mid lung and right lower lung atelectasis noted on the prior exam has mostly resolved. No new lung abnormalities. No pulmonary edema. There is no mediastinal widening. The mediastinal tubes and right internal jugular introducer sheath are stable. IMPRESSION: 1. Status post removal of the chest tubes. No convincing pneumothorax. 2. No acute finding or evidence of an operative complication. No mediastinal widening or pulmonary edema. Electronically Signed   By: Lajean Manes M.D.   On: 02/19/2017 07:17   Dg Chest Port 1 View  Result Date: 02/18/2017 CLINICAL DATA:  Atelectasis. Three days postop for aortic root replacement and repair of ascending aortic dissection as well as clipping of the atrial appendage. EXAM: PORTABLE CHEST 1 VIEW COMPARISON:  One-view chest x-ray 02/17/2017 FINDINGS: The heart is enlarged. Median sternotomy for aortic valve replacement and atrial clipping is again noted. Bilateral chest tubes remain place. There is no pneumothorax. Mediastinal drain is in place. Lung volumes remain low. Bilateral atelectasis is not significantly changed. A right IJ sheath is place. IMPRESSION: 1. Stable appearance of mediastinal and bilateral pleural catheter is without pneumothorax or pneumomediastinum. 2. Similar appearance of mild bilateral atelectasis. Electronically Signed   By: San Morelle M.D.   On: 02/18/2017 07:30   Dg Chest Port 1 View  Result Date: 02/17/2017 CLINICAL DATA:  Two days postop CABG, aortic root replacement procedure and repair of ascending aortic dissection. EXAM: PORTABLE CHEST 1 VIEW COMPARISON:  02/16/2017 FINDINGS: Cardiomegaly, superior mediastinal fullness and left atrial clip again identified. Mediastinal and bilateral thoracostomy tubes are again noted without pneumothorax. There has been removal of an endotracheal tube, NG tube and  Swan-Ganz catheter with right IJ central venous catheter sheath remaining. Improved lung volumes noted with decreased scattered of atelectasis within both lungs. IMPRESSION: Improved lung volumes with decreased areas of atelectasis within both lungs. No other significant change. No evidence of pneumothorax. Unchanged cardiomediastinal silhouette. Electronically Signed   By: Margarette Canada M.D.   On: 02/17/2017 07:57   Dg Chest Port 1 View  Result Date: 02/16/2017 CLINICAL DATA:  Intubation. EXAM: PORTABLE CHEST 1 VIEW COMPARISON:  02/15/2017 . FINDINGS: Endotracheal tube, Swan-Ganz catheter, mediastinal drainage tube, bilateral chest tubes in stable position. NG tube is again noted within the esophagus. Prior CABG and cardiac valve replacement. Left atrial appendage clip. Stable mediastinal and cardiac prominence noted. Stable bilateral subsegmental atelectasis. No prominent pleural effusion or pneumothorax. IMPRESSION: 1. Lines and tubes including bilateral chest tubes in stable position. NG tube is again noted within the esophagus. No pneumothorax. 2. Prior CABG and cardiac valve replacement. Left atrial appendage clip noted. Stable prominence of the cardiomediastinal silhouette. 3.  Stable subsegmental atelectatic changes both lungs. Electronically Signed   By: Marcello Moores  Register   On: 02/16/2017 07:41   Dg Chest Port 1 View  Result Date: 02/15/2017 CLINICAL DATA:  Postop from CABG and aortic valve replacement. Atelectasis. EXAM:  PORTABLE CHEST 1 VIEW COMPARISON:  02/13/2017 FINDINGS: Swan-Ganz catheter tip is seen in the proximal right pulmonary artery. Bilateral chest tubes are seen in appropriate position, and no pneumothorax visualized. Endotracheal tube is in appropriate position. Nasogastric tube is looped within the thoracic esophagus. Mediastinal drains are also seen. Patient has undergone CABG and aortic valve replacement. Mild cardiomegaly noted. Atelectasis is seen in the central right upper and  left lower lobes. IMPRESSION: Atelectasis in central right upper and left lower lobes. No evidence of pneumothorax. Abnormal position of nasogastric tube, which is looped within the thoracic esophagus. These results will be called to the ordering clinician or representative by the Radiologist Assistant, and communication documented in the PACS or zVision Dashboard. Electronically Signed   By: Earle Gell M.D.   On: 02/15/2017 19:45   Dg Chest Port 1 View  Result Date: 02/13/2017 CLINICAL DATA:  Chest pain EXAM: PORTABLE CHEST 1 VIEW COMPARISON:  Chest radiograph January 10, 2017 and chest CT Feb 11, 2017 FINDINGS: There is cardiac enlargement; recent chest for CT demonstrated pericardial effusion. Aorta is prominent; recent CT demonstrated aortic dissection. There is mild atelectasis in the right mid lung. Lungs elsewhere clear. No adenopathy. There is degenerative change in each shoulder with superior migration of each humeral head. IMPRESSION: Aortic prominence consistent with known thoracic aortic dissection. There is aortic atherosclerosis. There is cardiomegaly with known pericardial effusion. Mild right midlung atelectasis. No edema or consolidation. Chronic rotator cuff tears bilaterally. Electronically Signed   By: Lowella Grip III M.D.   On: 02/13/2017 07:14   Dg Swallowing Func-speech Pathology  Result Date: 02/19/2017 Objective Swallowing Evaluation: Type of Study: MBS-Modified Barium Swallow Study Patient Details Name: Lydia Jordan MRN: [id number] Date of Birth: 09/15/1934 Today's Date: 02/19/2017 Time: SLP Start Time (ACUTE ONLY): 1156-SLP Stop Time (ACUTE ONLY): 1220 SLP Time Calculation (min) (ACUTE ONLY): 24 min Past Medical History: Past Medical History: Diagnosis Date . Acid reflux  . Arthritis  . Ascending aortic aneurysm ([place]) 01/11/2017 . Ascending aortic dissection ([place]) 02/11/2017 . CAD (coronary artery disease)   Cath 01/11/17 showed 50% ost RCA and 50% mLAD --> medical therapy  .  Cervical disc disease  . Chronic pain   Secondary to degenerative disc disease and arthritis . Degenerative arthritis  . Hydronephrosis of right kidney  . Hypertension  . Hypothyroidism  . Hypothyroidism  . Longstanding persistent atrial fibrillation ([place])   originally diagnosed 2007 . NSTEMI (non-ST elevated myocardial infarction) ([place]) 01/10/2017 . Pubic ramus fracture, right, closed, initial encounter ([place]) 02/12/2017 . S/P ascending aortic dissection repair 02/15/2017  Straight graft replacement of sub-acute ascending aortic dissection with hemi-arch distal aortic reconstruction . 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 AORTIC VALVE REPLACEMENT N/A 02/15/2017  Procedure: AORTIC VALVE REPLACEMENT (AVR);  Surgeon: [name], MD;  Location: [facility];  Service: Open Heart Surgery;  Laterality: N/A; . BENTALL PROCEDURE N/A 02/15/2017  Procedure: BENTALL AORTIC ROOT REPLACEMENT PROCEDURE, REPAIR ASCENDING AORTIC DISSECTION;  Surgeon: [name], MD;  Location: [facility];  Service: Open Heart Surgery;  Laterality: N/A; . CLIPPING OF ATRIAL APPENDAGE Left 02/15/2017  Procedure: CLIPPING OF ATRIAL APPENDAGE;  Surgeon: [name], MD;  Location: [facility];  Service: Open Heart Surgery;  Laterality: Left; . CORONARY ARTERY BYPASS GRAFT N/A 02/15/2017  Procedure: CORONARY ARTERY BYPASS GRAFTING (CABG) x  one, using left saphenous vein;  Surgeon: [name], MD;  Location: [facility];  Service: Vascular;  Laterality: N/A; . ESOPHAGEAL MANOMETRY N/A 07/31/2015  Procedure: ESOPHAGEAL MANOMETRY (EM);  Surgeon: Arta Silence, MD;  Location: [facility];  Service: Endoscopy;  Laterality: N/A; . ESOPHAGOGASTRODUODENOSCOPY  (EGD) WITH PROPOFOL N/A 09/02/2015  Procedure: ESOPHAGOGASTRODUODENOSCOPY (EGD) WITH PROPOFOL;  Surgeon: Arta Silence, MD;  Location: [facility];  Service: Endoscopy;  Laterality: N/A;  botulinum toxin injection (100 Units; 25 units in 4 quadrants 1-2 cm proximal to GE junction . LEFT HEART CATH AND CORONARY ANGIOGRAPHY N/A 01/11/2017  Procedure: Left Heart Cath and Coronary Angiography;  Surgeon: Leonie Man, MD;  Location: [facility];  Service: Cardiovascular;  Laterality: N/A; . PERICARDIAL FLUID DRAINAGE  02/15/2017  Procedure: DRAINAGE OF PERICARDIAL FLUID;  Surgeon: [name], MD;  Location: [place];  Service: Vascular;; . skin cancer area removed     right leg healing well . TEE WITHOUT CARDIOVERSION N/A 02/15/2017  Procedure: TRANSESOPHAGEAL ECHOCARDIOGRAM (TEE);  Surgeon: [name], MD;  Location: [facility] [facility];  Service: Open Heart Surgery;  Laterality: N/A; . VAGINAL HYSTERECTOMY   HPI: Pt is an 83 y.o. female with PMH of atrial fibrillation on eliquis, HTN, hypothyroidism, acid reflux, recent NSTEMI, aortic dissection. Pt had a mechanical fall on 5/9, during which time she bruised her face and hip. She had pain after the fall and felt she needed to be seen, therefore presenting 5/12. Given that she is on anticoagulation, she underwent CT of head and pelvis. Initial CT concerning for unclear mass vs. Consolidation at left lung apex, so repeat CT with contrast performed. CT showed type A dissection. Underwent CT surgery on 5/16 and was extubated 5/17. CXR 5/19 showed mild bilateral atelectasis. Bedside swallow eval ordered due to coughing observed with oral intake per MD. Pt currently on dysphagia 1 diet/ nectar thick liquids. barium swallow 06/2015: "Significant tertiary contractions in the mid and distal esophagus; short segment stricture of the distal esophagus just above the small hiatal hernia. The barium pill lodges at the level of the short segment distal esophageal stricture;  prominent cricopharyngeus muscle. " Pt reports botox tx to esophagus.   Subjective: alert, communicative Assessment / Plan / Recommendation CHL IP CLINICAL IMPRESSIONS 02/19/2017 Clinical Impression Pt presents with a functional oropharyngeal swallow, marked by high penetration of thin liquids occasionally, not considered disordered in the elderly.  There was adequate mastication; good pharyngeal clearance of POs; no aspiration.  Prominent cricopharyngeus was evident, and esophageal sweep revealed barium stasis filling the esophageal column and reaching cervical level.  Pt acknowledges hx of esophageal deficits.  For now, recommend changing diet to full liquids (thin liquids/straws permitted); pt should sit upright for meals and 45 minutes after.  She may benefit from repeat esophageal w/u when appropriate.  She is at risk for backflow/aspiration of esophageal material.  RN present; discussed results/recommendations.  SLP Visit Diagnosis Dysphagia, pharyngoesophageal phase (R13.14) Attention and concentration deficit following -- Frontal lobe and executive function deficit following -- Impact on safety and function Mild aspiration risk   CHL IP TREATMENT RECOMMENDATION 02/19/2017 Treatment Recommendations Therapy as outlined in treatment plan below   Prognosis 02/18/2017 Prognosis for Safe Diet Advancement (No Data) Barriers to Reach Goals -- Barriers/Prognosis Comment -- CHL IP DIET RECOMMENDATION 02/19/2017 SLP Diet Recommendations Other (Comment) Liquid Administration via Straw Medication Administration Crushed with  puree Compensations Follow solids with liquid Postural Changes Seated upright at 90 degrees   CHL IP OTHER RECOMMENDATIONS 02/19/2017 Recommended Consults Consider esophageal assessment Oral Care Recommendations Oral care BID Other Recommendations --   CHL IP FOLLOW UP RECOMMENDATIONS 02/19/2017 Follow up Recommendations (No Data)   CHL IP FREQUENCY AND DURATION 02/19/2017 Speech Therapy Frequency (ACUTE  ONLY) min 2x/week Treatment Duration 1 week      CHL IP ORAL PHASE 02/19/2017 Oral Phase WFL Oral - Pudding Teaspoon -- Oral - Pudding Cup -- Oral - Honey Teaspoon -- Oral - Honey Cup -- Oral - Nectar Teaspoon -- Oral - Nectar Cup -- Oral - Nectar Straw -- Oral - Thin Teaspoon -- Oral - Thin Cup -- Oral - Thin Straw -- Oral - Puree -- Oral - Mech Soft -- Oral - Regular -- Oral - Multi-Consistency -- Oral - Pill -- Oral Phase - Comment --  CHL IP PHARYNGEAL PHASE 02/19/2017 Pharyngeal Phase WFL Pharyngeal- Pudding Teaspoon -- Pharyngeal -- Pharyngeal- Pudding Cup -- Pharyngeal -- Pharyngeal- Honey Teaspoon -- Pharyngeal -- Pharyngeal- Honey Cup -- Pharyngeal -- Pharyngeal- Nectar Teaspoon -- Pharyngeal -- Pharyngeal- Nectar Cup -- Pharyngeal -- Pharyngeal- Nectar Straw -- Pharyngeal -- Pharyngeal- Thin Teaspoon -- Pharyngeal -- Pharyngeal- Thin Cup -- Pharyngeal -- Pharyngeal- Thin Straw -- Pharyngeal -- Pharyngeal- Puree -- Pharyngeal -- Pharyngeal- Mechanical Soft -- Pharyngeal -- Pharyngeal- Regular -- Pharyngeal -- Pharyngeal- Multi-consistency -- Pharyngeal -- Pharyngeal- Pill -- Pharyngeal -- Pharyngeal Comment --  CHL IP CERVICAL ESOPHAGEAL PHASE 02/19/2017 Cervical Esophageal Phase Impaired Pudding Teaspoon -- Pudding Cup -- Honey Teaspoon -- Honey Cup -- Nectar Teaspoon -- Nectar Cup -- Nectar Straw -- Thin Teaspoon -- Thin Cup -- Thin Straw -- Puree -- Mechanical Soft -- Regular -- Multi-consistency -- Pill -- Cervical Esophageal Comment (No Data) No flowsheet data found. [name] 02/19/2017, 1:11 PM              Ct Maxillofacial Wo Contrast  Result Date: 02/11/2017 CLINICAL DATA:  Patient fell Wednesday and hit the right side of her head and face on curb. Swelling and bruising on right side around orbit and frontal region. Patient has previous history of loss of disk height in her cervical spine EXAM: CT HEAD WITHOUT CONTRAST CT MAXILLOFACIAL WITHOUT CONTRAST CT CERVICAL SPINE WITHOUT  CONTRAST TECHNIQUE: Multidetector CT imaging of the head, cervical spine, and maxillofacial structures were performed using the standard protocol without intravenous contrast. Multiplanar CT image reconstructions of the cervical spine and maxillofacial structures were also generated. COMPARISON:  Head CT dated 02/10/2015. MRI of the cervical spine dated 07/20/2016. FINDINGS: CT HEAD FINDINGS Brain: There is mild generalized parenchymal atrophy with commensurate dilatation of the ventricles and sulci. Mild chronic small vessel ischemic changes noted within the periventricular and subcortical white matter. There is no mass, hemorrhage, edema or other evidence of acute parenchymal abnormality. No extra-axial hemorrhage. Vascular: There are chronic calcified atherosclerotic changes of the large vessels at the skull base. No unexpected hyperdense vessel. Skull: Chronic-appearing deformity of the right zygoma and right lateral orbital wall. No acute appearing fracture or displacement. Other: Soft tissue hematoma overlying the right frontal bone, measuring approximately 1.7 x 1 cm. No underlying fracture. CT MAXILLOFACIAL FINDINGS Osseous: Lower frontal bones are intact and normally aligned. Old mild deformity of the right lateral orbital wall, stable compared to the earlier head CT from 2016. Osseous structures about the orbits are otherwise intact and normally aligned bilaterally. No displaced nasal bone fracture. Old healed  fracture of the right zygoma. Left zygoma is intact and normally aligned. Bilateral pterygoid plates appear intact and normally aligned. Walls of the maxillary sinuses are intact and normally aligned bilaterally. Temporal bones appear intact and normally aligned. Orbits: Negative. No traumatic or inflammatory finding. Sinuses: Clear. Soft tissues: Unremarkable. CT CERVICAL SPINE FINDINGS Alignment: There is reversal of the normal cervical spine lordosis, likely related to the underlying degenerative  changes, stable compared to the earlier MRI given patient positioning. No evidence of acute vertebral body subluxation. Skull base and vertebrae: No fracture line or displaced fracture fragment identified. Soft tissues and spinal canal: No prevertebral fluid or swelling. No visible canal hematoma. Disc levels: Degenerative changes throughout the cervical spine, with associated disc space narrowings and osseous spurring. Most significant degenerative change at the C5-6 and C6-7 levels with complete loss of disc space height and associated osseous spurring, likely source for the overlying reversal of normal cervical spine lordosis. There is, however, no more than mild central canal stenosis at any level. Degenerative hypertrophy of the uncovertebral and facet joints causing severe bilateral neural foramen stenoses at C3-4, severe bilateral neural foramen stenoses at C4-5, severe right neural foramen stenosis at C5-6 and moderate to severe bilateral neural foramen stenoses at C6-7. Suspect associated nerve root impingement at 1 or more levels. Upper chest: Mass versus consolidation at the left lung apex, incompletely imaged at the lower aspects of the chest CT. Other: Carotid atherosclerosis. IMPRESSION: 1. No acute intracranial abnormality. No intracranial mass, hemorrhage or edema. Chronic small vessel ischemic changes in the white matter. 2. Focal soft tissue hematoma overlying the right frontal bone. No underlying fracture. 3. No acute facial bone fracture or displacement. Old healed fractures of the right zygoma and right lateral orbital wall. 4. No fracture or acute subluxation within the cervical spine. Degenerative changes of the cervical spine, at least moderate in degree, as detailed above. 5. Masslike density at the left lung apex, incompletely imaged at the lower aspects of this chest CT. This likely represents the upper aspect of the ectatic thoracic aortic arch, but would consider chest CT to confirm  benignity. These results were called by telephone at the time of interpretation on 02/11/2017 at 6:15 pm to Dr. Maryan Rued, who verbally acknowledged these results. Electronically Signed   By: Franki Cabot M.D.   On: 02/11/2017 18:18    Lab Data:  CBC:  Recent Labs Lab 02/19/17 0437 02/19/17 1559 02/20/17 0440 02/21/17 0453 02/22/17 0432 02/17/2017 1057  WBC 16.9*  --  14.8* 15.8* 18.9* 22.3*  NEUTROABS  --   --   --   --  14.7* 18.3*  HGB 10.5* 10.2* 10.0* 10.5* 10.2* 9.4*  HCT 31.7* 30.0* 31.1* 33.1* 32.3* 29.1*  MCV 89.5  --  89.9 91.9 91.2 90.4  PLT 217  --  232 276 365 485   Basic Metabolic Panel:  Recent Labs Lab 02/19/17 0437 02/19/17 1559 02/20/17 0440 02/21/17 0453 02/22/17 0432 02/08/2017 0553  NA 133* 134* 132* 133* 129* 126*  K 3.2* 3.4* 3.3* 4.3 4.4 4.9  CL 97* 92* 95* 95* 91* 89*  CO2 30  --  30 32 30 30  GLUCOSE 94 118* 102* 88 103* 125*  BUN 17 19 14 17 16 19   CREATININE 0.75 0.70 0.60 0.62 0.64 0.68  CALCIUM 8.2*  --  7.7* 8.2* 8.5* 8.4*   GFR: Estimated Creatinine Clearance: 51 mL/min (by C-G formula based on SCr of 0.68 mg/dL). Liver Function Tests:  Recent Labs  Lab 02/18/17 0441 02/21/17 0453 02/22/17 0432  AST 36 22 24  ALT 22 19 20   ALKPHOS 85 80 94  BILITOT 1.2 1.2 1.2  PROT 5.7* 5.2* 5.7*  ALBUMIN 2.7* 2.3* 2.4*   No results for input(s): LIPASE, AMYLASE in the last 168 hours. No results for input(s): AMMONIA in the last 168 hours. Coagulation Profile: No results for input(s): INR, PROTIME in the last 168 hours. Cardiac Enzymes: No results for input(s): CKTOTAL, CKMB, CKMBINDEX, TROPONINI in the last 168 hours. BNP (last 3 results) No results for input(s): PROBNP in the last 8760 hours. HbA1C: No results for input(s): HGBA1C in the last 72 hours. CBG:  Recent Labs Lab 02/19/17 1626 02/19/17 2120 02/20/17 0833 02/20/17 2018 02/21/17 1134  GLUCAP 99 101* 81 142* 82   Lipid Profile: No results for input(s): CHOL, HDL,  LDLCALC, TRIG, CHOLHDL, LDLDIRECT in the last 72 hours. Thyroid Function Tests: No results for input(s): TSH, T4TOTAL, FREET4, T3FREE, THYROIDAB in the last 72 hours. Anemia Panel: No results for input(s): VITAMINB12, FOLATE, FERRITIN, TIBC, IRON, RETICCTPCT in the last 72 hours. Urine analysis:    Component Value Date/Time   COLORURINE YELLOW 02/22/2017 0717   APPEARANCEUR CLEAR 02/22/2017 0717   LABSPEC 1.015 02/22/2017 0717   PHURINE 8.0 02/22/2017 0717   GLUCOSEU NEGATIVE 02/22/2017 0717   HGBUR NEGATIVE 02/22/2017 [place] NEGATIVE 02/22/2017 [place] 02/22/2017 0717   PROTEINUR NEGATIVE 02/22/2017 0717   NITRITE NEGATIVE 02/22/2017 0717   LEUKOCYTESUR NEGATIVE 02/22/2017 0717     Sherrina Zaugg M.D. [facility] 2017-03-14, 12:41 PM  Pager: [phone] Between 7am to 7pm - call Pager - [phone]  After 7pm go to [email or web address] - password [email or web address]  Call night coverage person covering after 7pm

## 2017-03-03 NOTE — Progress Notes (Signed)
Palliative Medicine RN Note: Afternoon f/u. Pt relaxed but is now on 10 mg morphine/hour and getting 3 mg boluses. Lots of family in the room, including a baby great-granddaughter that she had previously verbalized a strong desire to meet.   Called pharmacy to confirm that we have adequate morphine on hand to continue at this rate. Discussed with Dr Hilma Favors; she will come see the patient later tonight if she has not passed away. PMT member will also see her tomorrow.  Marjie Skiff Nikie Cid, RN, BSN, [facility] 2017/03/02 2:04 PM Cell [phone] 8:00-4:00 Monday-Friday Office [phone]

## 2017-03-03 NOTE — IPOC Note (Signed)
IPOC note not completed due to discharge from rehab after cardiac arrest, pt DNR with comfort care on palliative unit.

## 2017-03-03 NOTE — Care Management Note (Signed)
Case Management Note  Patient Details  Name: Lydia Jordan MRN: [id number] Date of Birth: Jan 24, 1934  Subjective/Objective:      Pt admitted with Type A Aortic dissection- pt had mechanical fall on 5/9- with pubic ramus fx- per ortho will tx non surgical with WBAT- plan for pt to go to OR on 5/16- for Aortic dissection repair.  Per palliative  Note, patient on morphine drip, expecting hospital expiration.                                 Action/Plan: NCM will follow for dc needs.  Expected Discharge Date:                  Expected Discharge Plan:     In-House Referral:  Clinical Social Work  Discharge planning Services  CM Consult  Post Acute Care Choice:    Choice offered to:     DME Arranged:    DME Agency:     HH Arranged:    HH Agency:     Status of Service:  In process, will continue to follow  If discussed at Long Length of Stay Meetings, dates discussed:    Additional Comments:  Zenon Mayo, RN 04-Mar-2017, 2:34 PM

## 2017-03-03 NOTE — Progress Notes (Signed)
142ml morphine 250 mg in sodium chloride 0.9 % 250 mL (1 mg/mL) infusion wasted in sink.

## 2017-03-03 NOTE — Consult Note (Signed)
Consultation Note Date: 2017/03/22   Patient Name: Lydia Jordan  DOB: 22-Oct-1933  MRN: [id number]  Age / Sex: 83 y.o., female  PCP: Lavone Orn, MD Referring Physician: Mendel Corning, MD  Reason for Consultation: Terminal Care  HPI/Patient Profile: 83 y.o. female  with complicated hospitalization following aortic dissection and mechanical fall. Now approaching EOL. PMT consulted for symptom management.  Clinical Assessment and Goals of Care: Full comfort care     SUMMARY OF RECOMMENDATIONS   1. Hospital Death 2. Suction PRN for thick oral pharyngeal secretions 3. Parameters adjusted on her morphine infusion  Code Status/Advance Care Planning:  DNR    Symptom Management:   Pain and Dyspnea: increased infusion parameters and recommended continued bolus doses for discomfort.  In her case deep suction may help symptoms  Keep O2 at 2L or discontinue  Palliative Prophylaxis:   Frequent Pain Assessment, Oral Care and Turn Reposition  Additional Recommendations (Limitations, Scope, Preferences):  Full Comfort Care  Psycho-social/Spiritual:   Desire for further Chaplaincy support:no  Additional Recommendations: Caregiving  Support/Resources  Prognosis:   Hours - Days  Discharge Planning: Anticipated Hospital Death      Primary Diagnoses: Present on Admission: . Cardiac arrest ([place])   I have reviewed the medical record, interviewed the patient and family, and examined the patient. The following aspects are pertinent.  Past Medical History:  Diagnosis Date  . Acid reflux   . Arthritis   . Ascending aortic aneurysm ([place]) 01/11/2017  . Ascending aortic dissection ([place]) 02/11/2017  . CAD (coronary artery disease)    Cath 01/11/17 showed 50% ost RCA and 50% mLAD --> medical therapy   . Cervical disc disease   . Chronic diastolic congestive heart failure ([place])   .  Chronic pain    Secondary to degenerative disc disease and arthritis  . Degenerative arthritis   . Hydronephrosis of right kidney   . Hypertension   . Hypothyroidism   . Hypothyroidism   . Longstanding persistent atrial fibrillation ([place])    originally diagnosed 2007  . NSTEMI (non-ST elevated myocardial infarction) ([place]) 01/10/2017  . Pubic ramus fracture, right, closed, initial encounter ([place]) 02/12/2017  . S/P ascending aortic dissection repair 02/15/2017   Straight graft replacement of sub-acute ascending aortic dissection with hemi-arch distal aortic reconstruction  . S/P [place] aortic root replacement with bioprosthetic valve  02/15/2017   Biological [place] aortic root replacement with 21 mm Edwards Magna Ease bovine pericardial tissue valve and 24 mm Gelweave Valsalve aortic root graft with reimplantation of left main coronary artery   Social History   Social History  . Marital status: Divorced    Spouse name: N/A  . Number of children: 1  . Years of education: N/A   Occupational History  . retired    Social History Main Topics  . Smoking status: Former Research scientist (life sciences)  . Smokeless tobacco: Never Used     Comment: Former occasional smoker x 3 yrs.  . Alcohol use No  . Drug use: No  . Sexual  activity: Not on file   Other Topics Concern  . Not on file   Social History Narrative   Lives alone but in house connected to her daughter's house   Limited mobility due to severe degenerative arthritis   Family History  Problem Relation Age of Onset  . Hypertension Mother   . Anuerysm Mother        died in her 62's  . Arthritis Father        died at age 26  . Heart attack Brother        died in 53's   Scheduled Meds: . glycopyrrolate  0.2 mg Intravenous Q4H  . scopolamine  1 patch Transdermal Q72H   Continuous Infusions: . sodium chloride 10 mL/hr at 03/17/2017 1015  . morphine 12 mg/hr (2017-03-17 1522)   PRN Meds:.atropine, [DISCONTINUED] glycopyrrolate **OR** [DISCONTINUED]  glycopyrrolate **OR** glycopyrrolate, LORazepam, morphine, [DISCONTINUED] ondansetron **OR** ondansetron (ZOFRAN) IV, sodium chloride flush Medications Prior to Admission:  Prior to Admission medications   Medication Sig Start Date End Date Taking? Authorizing Provider  apixaban (ELIQUIS) 2.5 MG TABS tablet Take 1 tablet (2.5 mg total) by mouth 2 (two) times daily. 02/21/17   Barrett, Lodema Hong, PA-C  aspirin EC 81 MG EC tablet Take 1 tablet (81 mg total) by mouth daily. 02/22/17   Barrett, Erin R, PA-C  atorvastatin (LIPITOR) 20 MG tablet Take 1 tablet (20 mg total) by mouth daily. 01/12/17   Delos Haring, PA-C  carvedilol (COREG) 3.125 MG tablet Take 1 tablet (3.125 mg total) by mouth 2 (two) times daily with a meal. 02/21/17   Barrett, Erin R, PA-C  digoxin (LANOXIN) 0.125 MG tablet Take 0.125 mg by mouth every morning.    [provider]  DULoxetine (CYMBALTA) 30 MG capsule Take 30 mg by mouth at bedtime.     [provider]  DULoxetine (CYMBALTA) 60 MG capsule Take 60 mg by mouth daily. 01/02/17   [provider]  furosemide (LASIX) 40 MG tablet Take 1 tablet (40 mg total) by mouth 2 (two) times daily. 02/21/17 02/21/18  Barrett, Lodema Hong, PA-C  omeprazole (PRILOSEC) 40 MG capsule Take 40 mg by mouth at bedtime.    [provider]  potassium chloride (KLOR-CON) 20 MEQ packet Take 40 mEq by mouth 2 (two) times daily. 02/21/17   Barrett, Erin R, PA-C  SYNTHROID 100 MCG tablet Take 100 mcg by mouth daily before breakfast. 06/26/15   [provider]   Allergies  Allergen Reactions  . Demerol [Meperidine] Anaphylaxis    Reaction: "Heart stopped"   Review of Systems  Physical Exam  Vital Signs: BP 135/66 (BP Location: Right Arm)   Pulse 68   Temp 97.5 F (36.4 C) (Oral)   Resp 18   SpO2 100%  Pain Assessment: Faces   Pain Score: 5    SpO2: SpO2: 100 % O2 Device:SpO2: 100 % O2 Flow Rate: .O2 Flow Rate (L/min): 10 L/min  IO: Intake/output summary:   Intake/Output Summary (Last 24 hours) at 03/17/17 1649 Last data filed at 2017-03-17 0438  Gross per 24 hour  Intake            27.73 ml  Output                0 ml  Net            27.73 ml    LBM: Last BM Date: 02/22/17 Baseline Weight:   Most recent weight:  Palliative Assessment/Data:   Flowsheet Rows     Most Recent Value  Intake Tab  Referral Department  Hospitalist  Unit at Time of Referral  Med/Surg Unit  Palliative Care Primary Diagnosis  Cardiac  Date Notified  02/11/2017  Palliative Care Type  New Palliative care  Reason for referral  Clarify Goals of Care  Date of Admission  02/22/2017  # of days IP prior to Palliative referral  0  Clinical Assessment  Psychosocial & Spiritual Assessment  Palliative Care Outcomes       Time Total: 35 minutes Greater than 50%  of this time was spent counseling and coordinating care related to the above assessment and plan.  Signed by: Lane Hacker, DO   Please contact Palliative Medicine Team phone at [phone] for questions and concerns.  For individual provider: See Shea Evans

## 2017-03-03 NOTE — Progress Notes (Signed)
Nutrition Brief Note  Chart reviewed. Pt now transitioning to comfort care.  No further nutrition interventions warranted at this time.  Please re-consult as needed.   Maia Handa A. Tasnim Balentine, RD, LDN, CDE Pager: [phone] After hours Pager: [phone]  

## 2017-03-03 NOTE — Progress Notes (Signed)
   03/24/17 0950  Clinical Encounter Type  Visited With Patient and family together  Visit Type Critical Care  Spiritual Encounters  Spiritual Needs Emotional  Stress Factors  Patient Stress Factors Not reviewed  Family Stress Factors Family relationships  Introduction to daughter. Doctor speaking to other family members. [employer] of presence. Available as needed.

## 2017-03-03 NOTE — Progress Notes (Signed)
Patient resting comfortably, daughter at bedside. Lung sounds rhonchus throughout bilaterally. Morphine infusion continuous to RUE PICC. Emotional support given to daughter.

## 2017-03-03 NOTE — IPOC Note (Signed)
Pt transferred due to change in medical status. No IPOC completed due to unexpected discharge.

## 2017-03-03 NOTE — Progress Notes (Signed)
TCTS BRIEF PROGRESS NOTE   Stopped by to provide family support. I suspect that Mrs Marcantonio may have suffered an acute PE yesterday and completely concur w/ plans for palliative care.  [name], MD 2017/03/02 7:41 AM

## 2017-03-03 NOTE — Progress Notes (Signed)
Palliative Medicine RN Note: Consult noted for symptom management. Evaluated pt and discussed with Dr Rhea Pink.   Lydia Jordan is without signs of distress. However, her secretions are copious, and she has very audible collection of fluid in her throat. BUE edematous and fragile. Sternal incision intact with lidoderm patches on either side. PAINAD 0. Foley catheter in place.  Current medications include morphine IV continuous running at 6mg /hour, prn Robinul (getting q4), scopolamine, atropine gtts. Got 2 doses of prn Ativan since midnight. Other prn medications are in place but not needed. PRN morphine dose is 1mg /10min.  Dr Hilma Favors gave orders to d/c PO/subcutaneous medications. Breakthrough dose increased to 50% of continuous dose (changed to 3mg ), and parameters and range now in place for morphine titration. Robinul is now scheduled with breakthrough doses available.     Pt's daughter, granddaughter, sister, son in law at bedside. Granddaughter in an Therapist, sports. Discussed s/s decline & imminent demise, especially uncontrollable secretions. Per family request, I used a thin suction cath to try to remove some of the secretions, and granddaughter will continue to try; pt bit down on catheter, so this was aborted. Also turned pt onto her left side to try to manage secretions better.   Explained s/s distress in non-verbal pt and when to call for RN. Discussed possible changes including temperature changes, changes in extremity color/temperature, breathing changes, secretion changes.   Discussed treatment plan and pt's current symptom burden. When secretions are out of control, this is an indicator that death is imminent; literature reflects prognosis of less than 18 hours. She has pain that is requiring frequent increases in IV continuous morphine, and Dr Hilma Favors feels that this cannot be managed by a subcutaneous infusion. Due to pt's rapid decline, need for high dose IV morphine infusion and very short  prognosis, PMT does NOT support a transfer to inpt hospice. If family is adamant about moving pt and if they are willing to take the risk of death during transport, patient would need placement at an inpt hospice facility that provides IV (not SQ) infusions.  Plan for PMT to follow up this afternoon. Family has our contact information in case of concerns before then.  Lydia Skiff Shaw Dobek, RN, BSN, [facility] 10-Mar-2017 10:36 AM Cell [phone] 8:00-4:00 Monday-Friday Office [phone]

## 2017-03-03 DEATH — deceased

## 2017-03-07 ENCOUNTER — Ambulatory Visit: Payer: Medicare Other | Admitting: Physician Assistant

## 2017-03-27 ENCOUNTER — Ambulatory Visit: Payer: Medicare Other | Admitting: Thoracic Surgery (Cardiothoracic Vascular Surgery)

## 2019-05-01 IMAGING — RF DG SWALLOWING FUNCTION - NRPT [facility]
1 series · 18 of 24 positions shown · non-contrast
Comparison: none

[Series 1: run · 13 acquisitions, 18 frames shown]
[im 1/13]
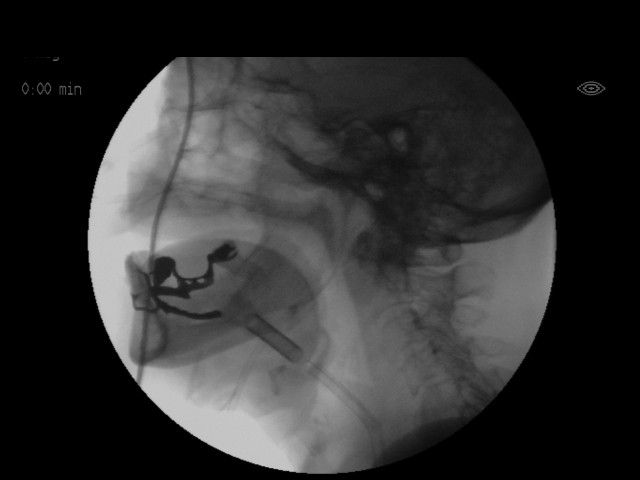
[im 2/13]
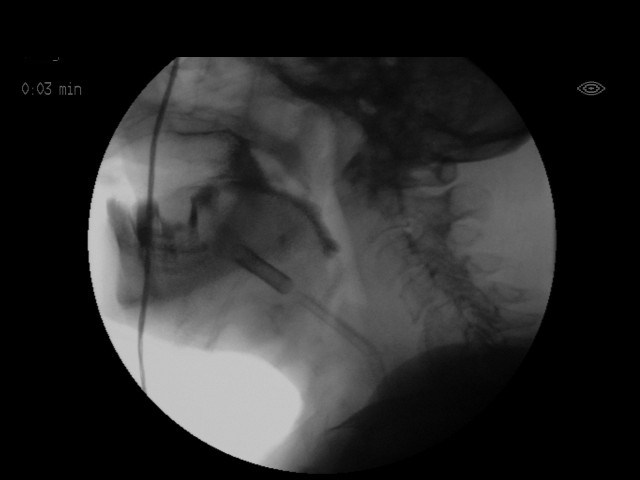
[im 2/13]
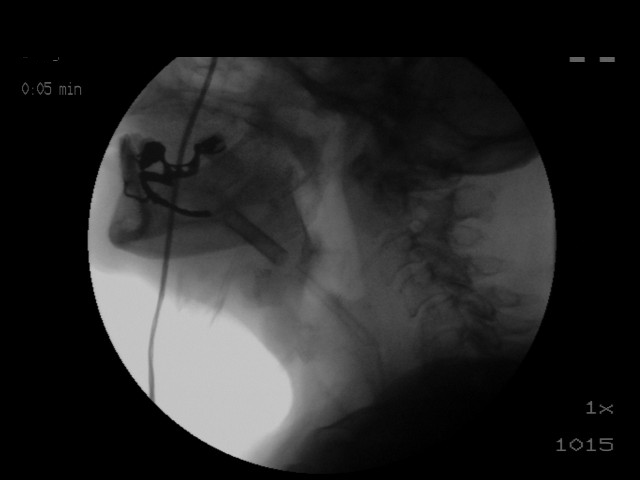
[im 3/13]
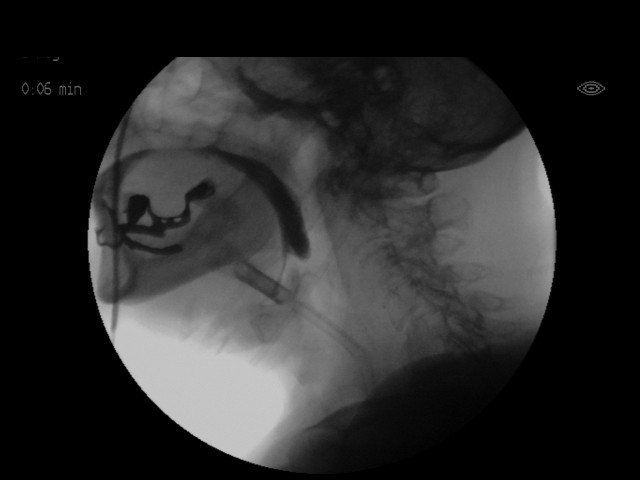
[im 4/13]
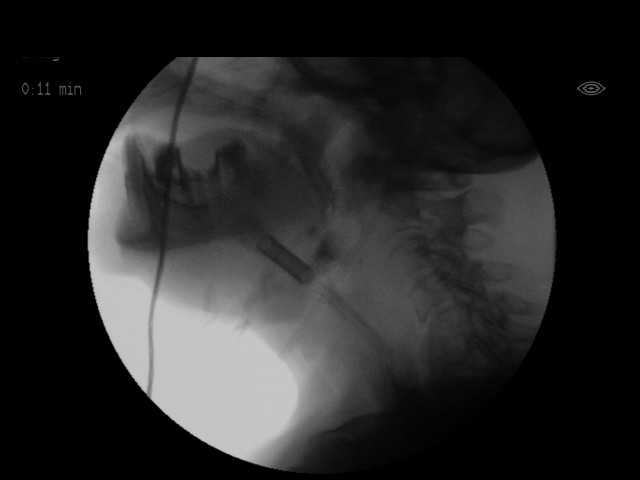
[im 4/13]
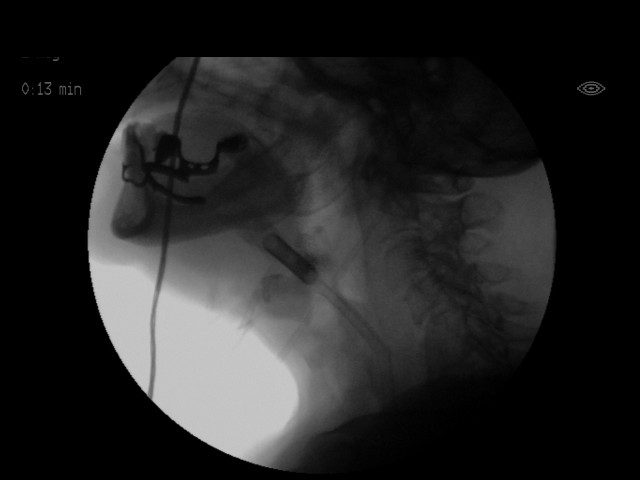
[im 5/13]
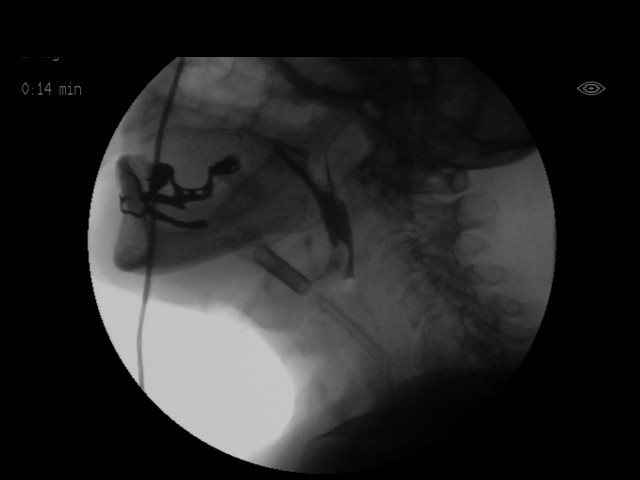
[im 6/13]
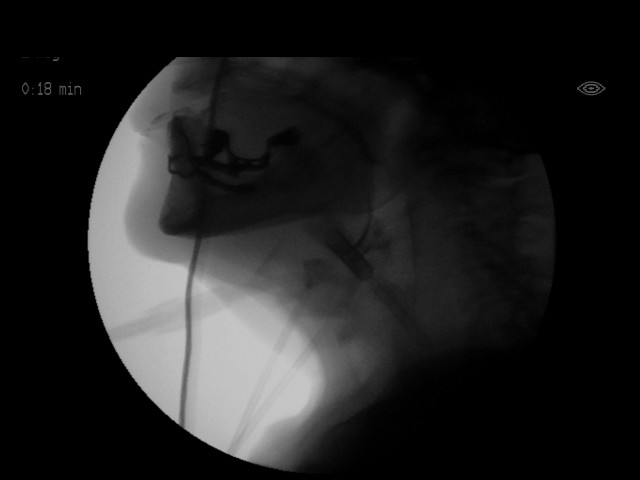
[im 7/13]
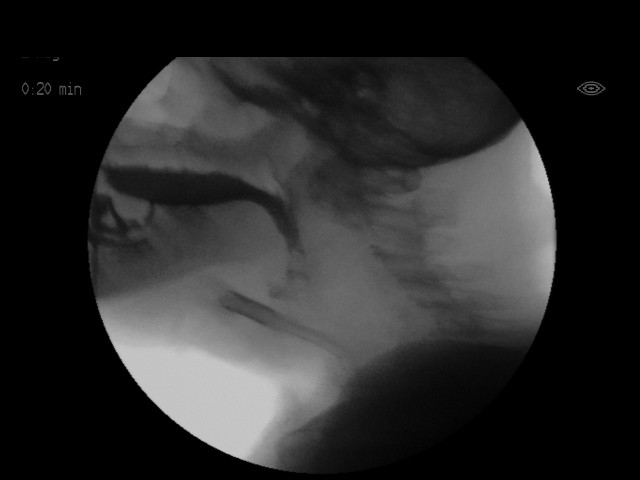
[im 7/13]
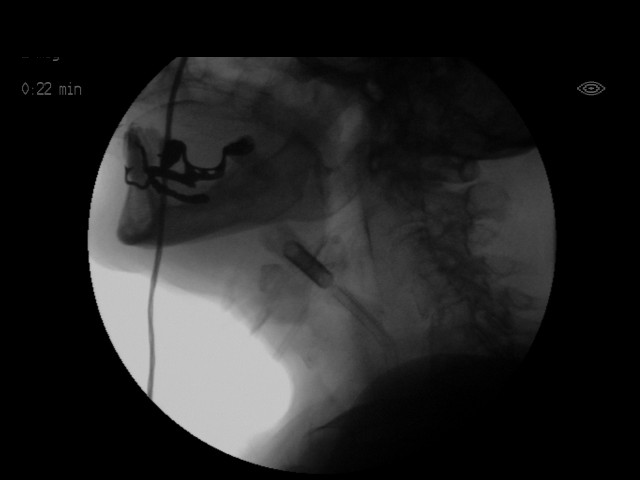
[im 8/13]
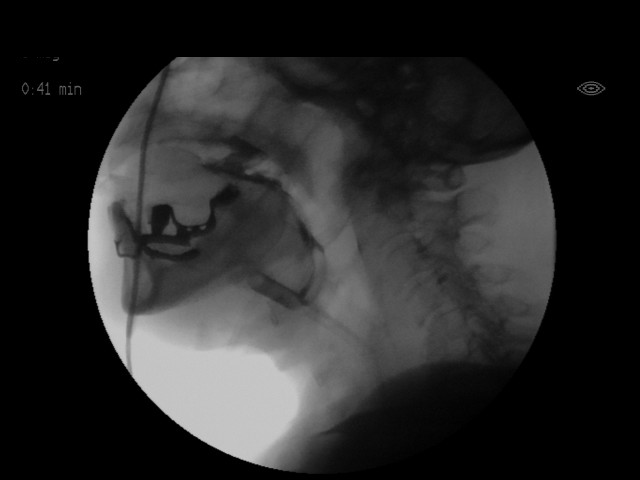
[im 9/13]
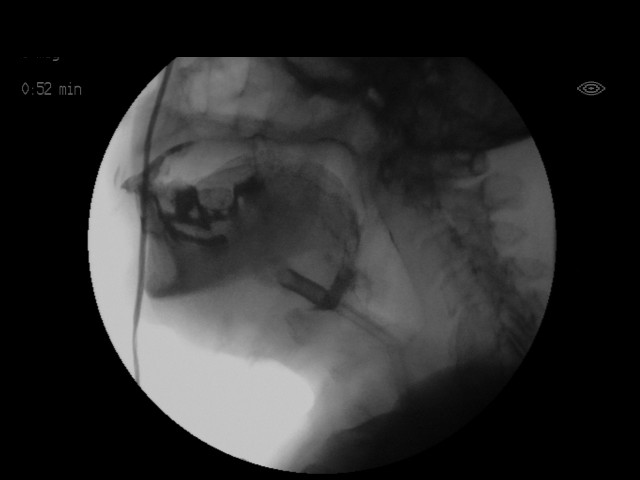
[im 10/13]
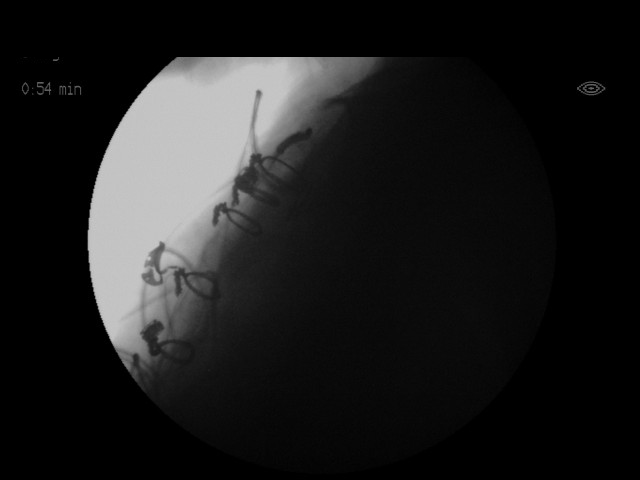
[im 11/13]
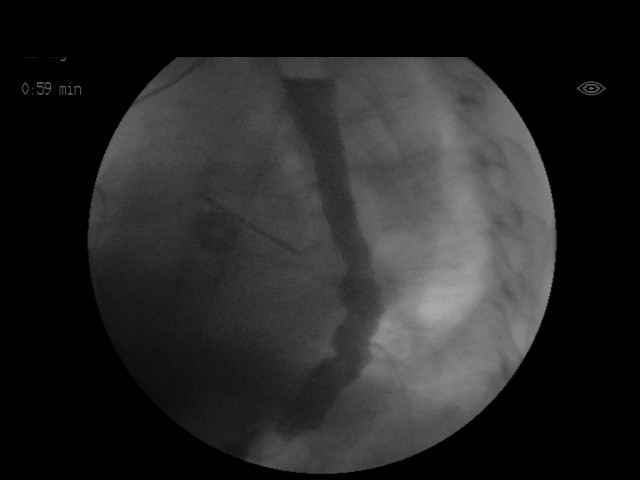
[im 11/13]
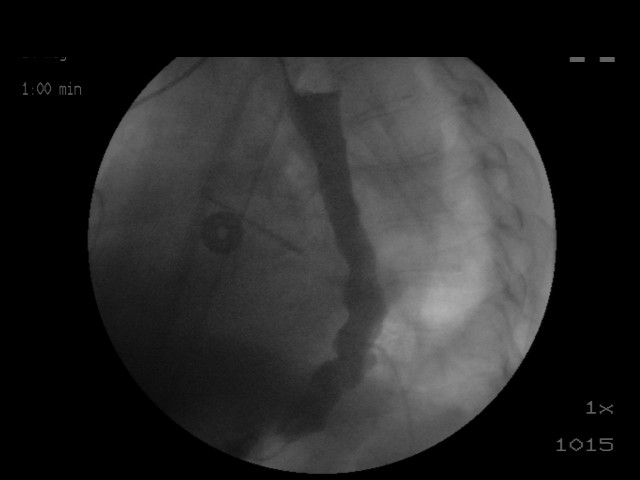
[im 12/13]
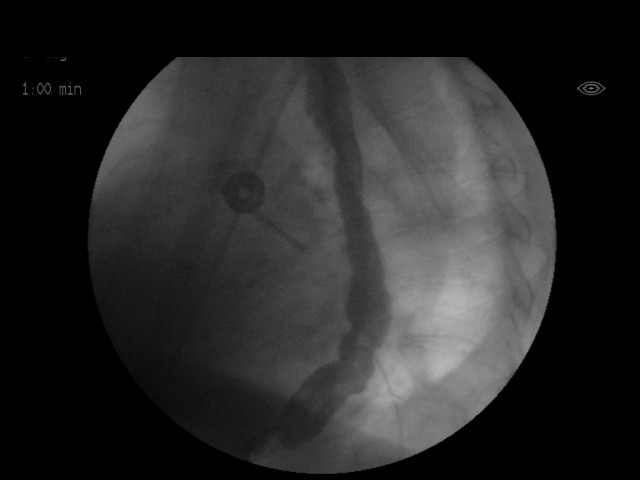
[im 13/13]
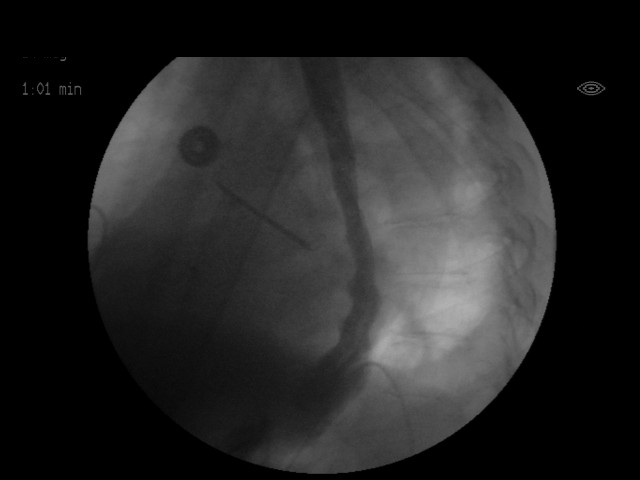
[im 13/13]
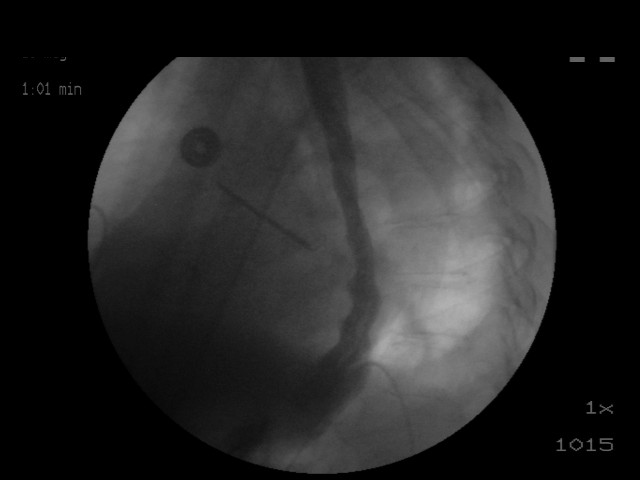

[18 of 24 positions shown; findings below may reference images not displayed]

FLUOROSCOPY FOR SWALLOWING FUNCTION STUDY:
Fluoroscopy was provided for swallowing function study, which was administered by a speech pathologist.  Final results and recommendations from this study are contained within the speech pathology report.

## 2019-05-02 IMAGING — CR DG CHEST 1V PORT
1 series · 1 of 1 positions shown · non-contrast
Comparison: 02/19/2017.

CLINICAL DATA: 83-year-old female post aortic valve replacement.
Subsequent encounter.

EXAM:
PORTABLE CHEST 1 VIEW

[AP]
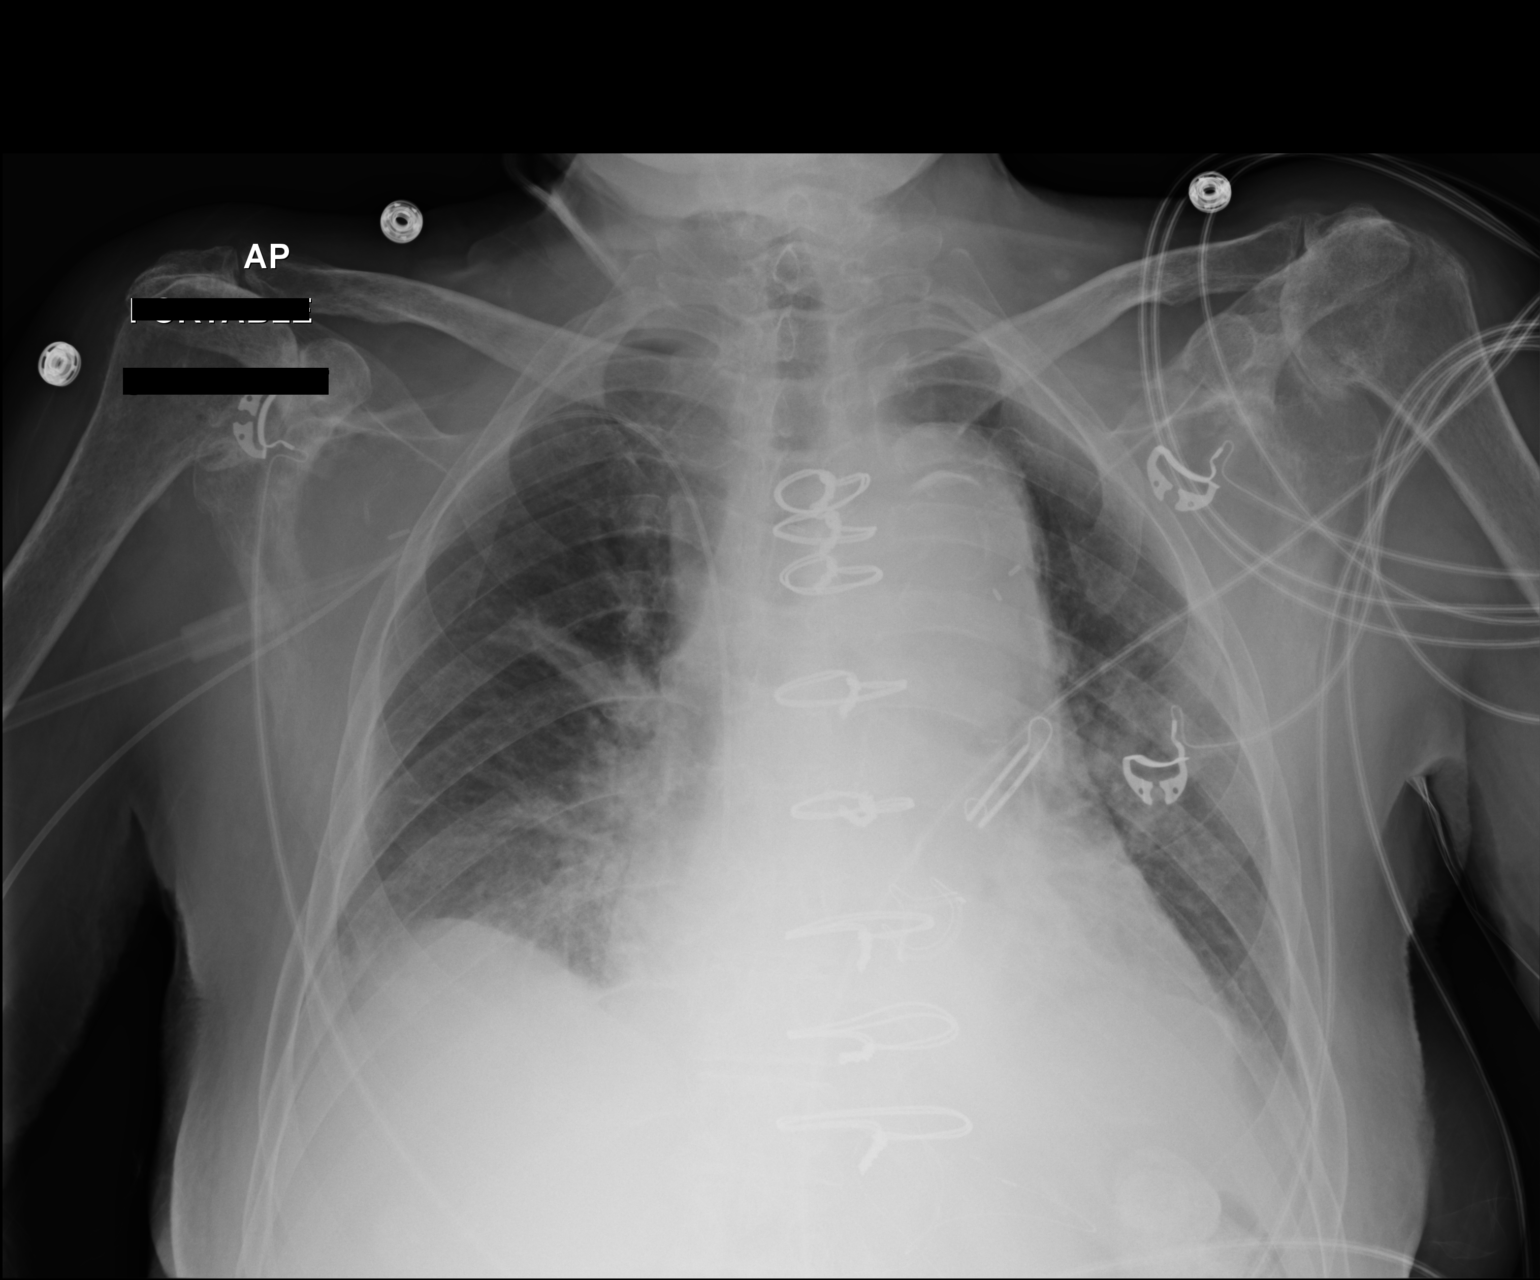

[1 of 1 positions shown; findings below may reference images not displayed]

FINDINGS: Right PICC line tip projects at the level of the distal superior
vena cava/ cavoatrial junction. Right internal jugular catheter has
been removed.

Post aortic valve replacement and left atrial appendage clipping.
Cardiomegaly.

Central pulmonary vascular congestion.

Perihilar/basilar subsegmental atelectasis.

There may be small pleural effusions.

No pneumothorax.

Calcified tortuous aorta.

Bilateral prominent shoulder joint degenerative changes.
IMPRESSION: Right internal jugular catheter has been removed.

Right PICC catheter tip distal superior vena cava/ cavoatrial
junction level.

Cardiomegaly post aortic valve replacement and left atrial appendage
clipping.

Central pulmonary vascular prominence.

Subsegmental atelectasis perihilar region lung bases.

There may be small bilateral pleural effusions.
